# Patient Record
Sex: Female | Born: 1945 | Race: White | Hispanic: Yes | Smoking: Former smoker
Health system: Southern US, Community
[De-identification: ages and names within clinical notes are randomized; demographics above are authoritative.]

## PROBLEM LIST (undated history)

## (undated) DIAGNOSIS — G43909 Migraine, unspecified, not intractable, without status migrainosus: Secondary | ICD-10-CM

## (undated) DIAGNOSIS — J309 Allergic rhinitis, unspecified: Secondary | ICD-10-CM

## (undated) DIAGNOSIS — G5600 Carpal tunnel syndrome, unspecified upper limb: Secondary | ICD-10-CM

## (undated) DIAGNOSIS — K2270 Barrett's esophagus without dysplasia: Secondary | ICD-10-CM

## (undated) DIAGNOSIS — R112 Nausea with vomiting, unspecified: Secondary | ICD-10-CM

## (undated) DIAGNOSIS — I471 Supraventricular tachycardia, unspecified: Secondary | ICD-10-CM

## (undated) DIAGNOSIS — R7303 Prediabetes: Secondary | ICD-10-CM

## (undated) DIAGNOSIS — Z8489 Family history of other specified conditions: Secondary | ICD-10-CM

## (undated) DIAGNOSIS — R7301 Impaired fasting glucose: Secondary | ICD-10-CM

## (undated) DIAGNOSIS — Z9889 Other specified postprocedural states: Secondary | ICD-10-CM

## (undated) DIAGNOSIS — M1990 Unspecified osteoarthritis, unspecified site: Secondary | ICD-10-CM

## (undated) DIAGNOSIS — E785 Hyperlipidemia, unspecified: Secondary | ICD-10-CM

## (undated) DIAGNOSIS — Q613 Polycystic kidney, unspecified: Secondary | ICD-10-CM

## (undated) DIAGNOSIS — C50919 Malignant neoplasm of unspecified site of unspecified female breast: Secondary | ICD-10-CM

## (undated) DIAGNOSIS — K224 Dyskinesia of esophagus: Secondary | ICD-10-CM

## (undated) DIAGNOSIS — K219 Gastro-esophageal reflux disease without esophagitis: Secondary | ICD-10-CM

## (undated) DIAGNOSIS — R160 Hepatomegaly, not elsewhere classified: Secondary | ICD-10-CM

## (undated) HISTORY — DX: Allergic rhinitis, unspecified: J30.9

## (undated) HISTORY — DX: Supraventricular tachycardia, unspecified: I47.10

## (undated) HISTORY — DX: Polycystic kidney, unspecified: Q61.3

## (undated) HISTORY — PX: COLONOSCOPY: SHX174

## (undated) HISTORY — DX: Impaired fasting glucose: R73.01

## (undated) HISTORY — PX: APPENDECTOMY: SHX54

## (undated) HISTORY — PX: EYE SURGERY: SHX253

## (undated) HISTORY — DX: Barrett's esophagus without dysplasia: K22.70

## (undated) HISTORY — DX: Malignant neoplasm of unspecified site of unspecified female breast: C50.919

## (undated) HISTORY — PX: UPPER GI ENDOSCOPY: SHX6162

## (undated) HISTORY — DX: Gastro-esophageal reflux disease without esophagitis: K21.9

## (undated) HISTORY — PX: WRIST SURGERY: SHX841

## (undated) HISTORY — DX: Migraine, unspecified, not intractable, without status migrainosus: G43.909

## (undated) HISTORY — PX: ABDOMINAL HYSTERECTOMY: SHX81

## (undated) HISTORY — DX: Unspecified osteoarthritis, unspecified site: M19.90

## (undated) HISTORY — PX: CATARACT EXTRACTION, BILATERAL: SHX1313

## (undated) HISTORY — DX: Supraventricular tachycardia: I47.1

## (undated) HISTORY — DX: Dyskinesia of esophagus: K22.4

## (undated) HISTORY — DX: Hyperlipidemia, unspecified: E78.5

## (undated) HISTORY — PX: TONSILLECTOMY: SUR1361

## (undated) HISTORY — PX: MASTECTOMY: SHX3

---

## 1981-03-06 HISTORY — PX: PARTIAL HYSTERECTOMY: SHX80

## 1991-03-07 HISTORY — PX: OTHER SURGICAL HISTORY: SHX169

## 2002-03-06 HISTORY — PX: OTHER SURGICAL HISTORY: SHX169

## 2003-03-07 HISTORY — PX: OTHER SURGICAL HISTORY: SHX169

## 2004-05-04 HISTORY — PX: OTHER SURGICAL HISTORY: SHX169

## 2005-04-06 ENCOUNTER — Encounter: Payer: Self-pay | Admitting: Family Medicine

## 2005-08-04 HISTORY — PX: OTHER SURGICAL HISTORY: SHX169

## 2005-10-03 ENCOUNTER — Ambulatory Visit: Payer: Self-pay | Admitting: Family Medicine

## 2005-12-27 ENCOUNTER — Ambulatory Visit: Payer: Self-pay | Admitting: Family Medicine

## 2006-01-12 ENCOUNTER — Ambulatory Visit: Payer: Self-pay | Admitting: Family Medicine

## 2006-02-14 ENCOUNTER — Encounter: Admission: RE | Admit: 2006-02-14 | Discharge: 2006-02-14 | Payer: Self-pay | Admitting: Nephrology

## 2006-02-22 ENCOUNTER — Encounter: Admission: RE | Admit: 2006-02-22 | Discharge: 2006-02-22 | Payer: Self-pay | Admitting: Family Medicine

## 2006-04-04 ENCOUNTER — Ambulatory Visit: Payer: Self-pay | Admitting: Family Medicine

## 2006-06-05 ENCOUNTER — Ambulatory Visit: Payer: Self-pay | Admitting: Family Medicine

## 2006-07-09 ENCOUNTER — Encounter: Payer: Self-pay | Admitting: Family Medicine

## 2006-09-04 ENCOUNTER — Ambulatory Visit: Payer: Self-pay | Admitting: Family Medicine

## 2006-09-06 LAB — CONVERTED CEMR LAB
ALT: 28 units/L (ref 0–35)
Albumin: 4 g/dL (ref 3.5–5.2)
Alkaline Phosphatase: 53 units/L (ref 39–117)
Bilirubin, Direct: 0.1 mg/dL (ref 0.0–0.3)
CO2: 31 meq/L (ref 19–32)
Calcium: 9.2 mg/dL (ref 8.4–10.5)
Creatinine, Ser: 1 mg/dL (ref 0.4–1.2)
GFR calc Af Amer: 72 mL/min
GFR calc non Af Amer: 60 mL/min
Glucose, Bld: 93 mg/dL (ref 70–99)
LDL Cholesterol: 102 mg/dL — ABNORMAL HIGH (ref 0–99)
Potassium: 4.2 meq/L (ref 3.5–5.1)
Sodium: 147 meq/L — ABNORMAL HIGH (ref 135–145)

## 2006-10-03 ENCOUNTER — Encounter: Admission: RE | Admit: 2006-10-03 | Discharge: 2006-10-03 | Payer: Self-pay | Admitting: Nephrology

## 2007-01-04 ENCOUNTER — Ambulatory Visit: Payer: Self-pay | Admitting: Family Medicine

## 2007-02-01 ENCOUNTER — Ambulatory Visit: Payer: Self-pay | Admitting: Family Medicine

## 2007-02-01 LAB — CONVERTED CEMR LAB
Bilirubin Urine: NEGATIVE
Casts: 0 /lpf
Ketones, urine, test strip: NEGATIVE
Nitrite: NEGATIVE
Urobilinogen, UA: 0.2
pH: 6

## 2007-02-25 ENCOUNTER — Encounter: Admission: RE | Admit: 2007-02-25 | Discharge: 2007-02-25 | Payer: Self-pay | Admitting: Family Medicine

## 2007-03-05 ENCOUNTER — Encounter (INDEPENDENT_AMBULATORY_CARE_PROVIDER_SITE_OTHER): Payer: Self-pay | Admitting: *Deleted

## 2007-04-10 ENCOUNTER — Encounter: Payer: Self-pay | Admitting: Family Medicine

## 2007-04-10 DIAGNOSIS — J309 Allergic rhinitis, unspecified: Secondary | ICD-10-CM | POA: Insufficient documentation

## 2007-04-10 DIAGNOSIS — J4520 Mild intermittent asthma, uncomplicated: Secondary | ICD-10-CM | POA: Insufficient documentation

## 2007-04-10 DIAGNOSIS — E785 Hyperlipidemia, unspecified: Secondary | ICD-10-CM | POA: Insufficient documentation

## 2007-04-10 DIAGNOSIS — Z8679 Personal history of other diseases of the circulatory system: Secondary | ICD-10-CM | POA: Insufficient documentation

## 2007-04-10 DIAGNOSIS — Z853 Personal history of malignant neoplasm of breast: Secondary | ICD-10-CM | POA: Insufficient documentation

## 2007-04-10 DIAGNOSIS — K219 Gastro-esophageal reflux disease without esophagitis: Secondary | ICD-10-CM | POA: Insufficient documentation

## 2007-04-10 DIAGNOSIS — J45909 Unspecified asthma, uncomplicated: Secondary | ICD-10-CM

## 2007-04-10 DIAGNOSIS — M1990 Unspecified osteoarthritis, unspecified site: Secondary | ICD-10-CM | POA: Insufficient documentation

## 2007-04-10 DIAGNOSIS — G43909 Migraine, unspecified, not intractable, without status migrainosus: Secondary | ICD-10-CM | POA: Insufficient documentation

## 2007-04-10 DIAGNOSIS — Q613 Polycystic kidney, unspecified: Secondary | ICD-10-CM | POA: Insufficient documentation

## 2007-04-11 ENCOUNTER — Ambulatory Visit: Payer: Self-pay | Admitting: Family Medicine

## 2007-04-11 LAB — CONVERTED CEMR LAB
Nitrite: NEGATIVE
Protein, U semiquant: NEGATIVE
Urobilinogen, UA: NEGATIVE

## 2007-04-18 ENCOUNTER — Encounter: Payer: Self-pay | Admitting: Family Medicine

## 2007-05-02 ENCOUNTER — Telehealth: Payer: Self-pay | Admitting: Family Medicine

## 2007-05-07 ENCOUNTER — Ambulatory Visit (HOSPITAL_COMMUNITY): Admission: RE | Admit: 2007-05-07 | Discharge: 2007-05-07 | Payer: Self-pay | Admitting: Family Medicine

## 2007-05-13 ENCOUNTER — Ambulatory Visit: Payer: Self-pay | Admitting: Family Medicine

## 2007-05-20 ENCOUNTER — Telehealth: Payer: Self-pay | Admitting: Family Medicine

## 2007-05-20 LAB — CONVERTED CEMR LAB
Albumin: 4.3 g/dL (ref 3.5–5.2)
Basophils Absolute: 0 10*3/uL (ref 0.0–0.1)
Basophils Relative: 0.8 % (ref 0.0–1.0)
Eosinophils Absolute: 0.1 10*3/uL (ref 0.0–0.6)
Eosinophils Relative: 1.8 % (ref 0.0–5.0)
Lipase: 22 units/L (ref 11.0–59.0)
Lymphocytes Relative: 36.3 % (ref 12.0–46.0)
MCV: 90.6 fL (ref 78.0–100.0)
Monocytes Relative: 8.4 % (ref 3.0–11.0)
Neutro Abs: 2.8 10*3/uL (ref 1.4–7.7)
Neutrophils Relative %: 52.7 % (ref 43.0–77.0)
RDW: 12 % (ref 11.5–14.6)
Total Protein: 7.2 g/dL (ref 6.0–8.3)

## 2007-05-21 ENCOUNTER — Ambulatory Visit: Payer: Self-pay | Admitting: Internal Medicine

## 2007-05-21 LAB — CONVERTED CEMR LAB
CO2: 31 meq/L (ref 19–32)
Chloride: 106 meq/L (ref 96–112)
Creatinine, Ser: 1 mg/dL (ref 0.4–1.2)
GFR calc Af Amer: 72 mL/min
GFR calc non Af Amer: 60 mL/min

## 2007-05-23 ENCOUNTER — Encounter: Payer: Self-pay | Admitting: Family Medicine

## 2007-05-24 ENCOUNTER — Encounter: Payer: Self-pay | Admitting: Family Medicine

## 2007-05-24 ENCOUNTER — Ambulatory Visit: Payer: Self-pay | Admitting: Internal Medicine

## 2007-05-29 ENCOUNTER — Telehealth: Payer: Self-pay | Admitting: Family Medicine

## 2007-05-30 ENCOUNTER — Ambulatory Visit: Payer: Self-pay | Admitting: Family Medicine

## 2007-05-30 DIAGNOSIS — R7301 Impaired fasting glucose: Secondary | ICD-10-CM | POA: Insufficient documentation

## 2007-07-08 ENCOUNTER — Telehealth: Payer: Self-pay | Admitting: Family Medicine

## 2007-07-23 ENCOUNTER — Encounter: Admission: RE | Admit: 2007-07-23 | Discharge: 2007-07-23 | Payer: Self-pay | Admitting: Family Medicine

## 2007-07-23 ENCOUNTER — Encounter: Payer: Self-pay | Admitting: Family Medicine

## 2007-08-13 ENCOUNTER — Telehealth: Payer: Self-pay | Admitting: Family Medicine

## 2007-08-22 ENCOUNTER — Telehealth: Payer: Self-pay | Admitting: Internal Medicine

## 2007-08-22 ENCOUNTER — Encounter: Admission: RE | Admit: 2007-08-22 | Discharge: 2007-08-22 | Payer: Self-pay | Admitting: Nephrology

## 2007-08-22 ENCOUNTER — Encounter: Payer: Self-pay | Admitting: Family Medicine

## 2007-08-22 ENCOUNTER — Encounter: Payer: Self-pay | Admitting: Internal Medicine

## 2007-08-25 ENCOUNTER — Encounter: Payer: Self-pay | Admitting: Family Medicine

## 2007-08-25 ENCOUNTER — Encounter: Admission: RE | Admit: 2007-08-25 | Discharge: 2007-08-25 | Payer: Self-pay | Admitting: Nephrology

## 2007-09-02 ENCOUNTER — Ambulatory Visit: Payer: Self-pay | Admitting: Family Medicine

## 2007-09-02 LAB — CONVERTED CEMR LAB: KOH Prep: NEGATIVE

## 2007-09-03 ENCOUNTER — Telehealth: Payer: Self-pay | Admitting: Family Medicine

## 2007-09-12 ENCOUNTER — Encounter: Admission: RE | Admit: 2007-09-12 | Discharge: 2007-09-12 | Payer: Self-pay | Admitting: Family Medicine

## 2007-09-25 ENCOUNTER — Ambulatory Visit: Payer: Self-pay | Admitting: Family Medicine

## 2007-10-03 ENCOUNTER — Ambulatory Visit: Payer: Self-pay | Admitting: Family Medicine

## 2007-10-03 LAB — CONVERTED CEMR LAB
Alkaline Phosphatase: 41 units/L (ref 39–117)
BUN: 19 mg/dL (ref 6–23)
Calcium: 9.5 mg/dL (ref 8.4–10.5)
Chloride: 106 meq/L (ref 96–112)
Creatinine, Ser: 1 mg/dL (ref 0.4–1.2)
GFR calc non Af Amer: 60 mL/min
Glucose, Bld: 95 mg/dL (ref 70–99)
HDL: 53.5 mg/dL (ref >39.0)
LDL Cholesterol: 102 mg/dL — ABNORMAL HIGH (ref 0–99)
Potassium: 4.3 meq/L (ref 3.5–5.1)
Sodium: 141 meq/L (ref 135–145)
Total Bilirubin: 0.5 mg/dL (ref 0.3–1.2)
Triglycerides: 76 mg/dL (ref 0–149)

## 2007-10-21 ENCOUNTER — Ambulatory Visit: Payer: Self-pay | Admitting: Family Medicine

## 2007-11-07 ENCOUNTER — Encounter: Payer: Self-pay | Admitting: Family Medicine

## 2007-11-07 ENCOUNTER — Encounter: Payer: Self-pay | Admitting: Internal Medicine

## 2007-12-02 ENCOUNTER — Ambulatory Visit: Payer: Self-pay | Admitting: Family Medicine

## 2008-03-03 ENCOUNTER — Encounter: Admission: RE | Admit: 2008-03-03 | Discharge: 2008-03-03 | Payer: Self-pay | Admitting: Family Medicine

## 2008-03-04 ENCOUNTER — Encounter: Payer: Self-pay | Admitting: Family Medicine

## 2008-04-22 ENCOUNTER — Ambulatory Visit: Payer: Self-pay | Admitting: Family Medicine

## 2008-04-24 ENCOUNTER — Ambulatory Visit: Payer: Self-pay | Admitting: Family Medicine

## 2008-04-27 ENCOUNTER — Encounter: Admission: RE | Admit: 2008-04-27 | Discharge: 2008-04-27 | Payer: Self-pay | Admitting: Nephrology

## 2008-04-30 LAB — CONVERTED CEMR LAB
ALT: 21 units/L (ref 0–35)
CO2: 31 meq/L (ref 19–32)
Chloride: 107 meq/L (ref 96–112)
Cholesterol: 189 mg/dL (ref 0–200)
GFR calc Af Amer: 82 mL/min
Glucose, Bld: 97 mg/dL (ref 70–99)
HDL: 65.6 mg/dL (ref >39.0)
Sodium: 144 meq/L (ref 135–145)
Total CHOL/HDL Ratio: 2.9
Triglycerides: 72 mg/dL (ref 0–149)

## 2008-05-07 ENCOUNTER — Encounter: Payer: Self-pay | Admitting: Internal Medicine

## 2008-05-07 ENCOUNTER — Encounter: Payer: Self-pay | Admitting: Family Medicine

## 2008-05-08 ENCOUNTER — Encounter: Admission: RE | Admit: 2008-05-08 | Discharge: 2008-05-08 | Payer: Self-pay | Admitting: Nephrology

## 2008-06-08 ENCOUNTER — Ambulatory Visit (HOSPITAL_COMMUNITY): Admission: RE | Admit: 2008-06-08 | Discharge: 2008-06-08 | Payer: Self-pay | Admitting: Nephrology

## 2008-06-08 ENCOUNTER — Encounter (INDEPENDENT_AMBULATORY_CARE_PROVIDER_SITE_OTHER): Payer: Self-pay | Admitting: Interventional Radiology

## 2008-06-24 ENCOUNTER — Ambulatory Visit: Payer: Self-pay | Admitting: Family Medicine

## 2008-06-24 LAB — CONVERTED CEMR LAB
Bilirubin Urine: NEGATIVE
Blood in Urine, dipstick: NEGATIVE
Glucose, Urine, Semiquant: NEGATIVE
Nitrite: NEGATIVE
Protein, U semiquant: NEGATIVE
Specific Gravity, Urine: <1.005
Urobilinogen, UA: 0.2
pH: 6

## 2008-06-25 ENCOUNTER — Encounter: Payer: Self-pay | Admitting: Family Medicine

## 2008-07-09 ENCOUNTER — Encounter: Payer: Self-pay | Admitting: Family Medicine

## 2008-07-28 ENCOUNTER — Telehealth: Payer: Self-pay | Admitting: Family Medicine

## 2008-08-06 ENCOUNTER — Encounter: Payer: Self-pay | Admitting: Family Medicine

## 2008-10-22 ENCOUNTER — Encounter: Payer: Self-pay | Admitting: Family Medicine

## 2008-10-26 ENCOUNTER — Encounter: Payer: Self-pay | Admitting: Family Medicine

## 2008-11-17 ENCOUNTER — Encounter: Payer: Self-pay | Admitting: Cardiology

## 2008-12-02 ENCOUNTER — Ambulatory Visit: Payer: Self-pay | Admitting: Family Medicine

## 2008-12-04 ENCOUNTER — Ambulatory Visit: Payer: Self-pay | Admitting: Cardiology

## 2008-12-07 ENCOUNTER — Telehealth (INDEPENDENT_AMBULATORY_CARE_PROVIDER_SITE_OTHER): Payer: Self-pay | Admitting: *Deleted

## 2008-12-08 ENCOUNTER — Ambulatory Visit: Payer: Self-pay | Admitting: Cardiology

## 2008-12-08 ENCOUNTER — Encounter (HOSPITAL_COMMUNITY): Admission: RE | Admit: 2008-12-08 | Discharge: 2009-02-05 | Payer: Self-pay | Admitting: Cardiology

## 2008-12-08 ENCOUNTER — Ambulatory Visit: Payer: Self-pay

## 2008-12-08 LAB — CONVERTED CEMR LAB
ALT: 18 units/L (ref 0–35)
AST: 21 units/L (ref 0–37)
Albumin: 4.2 g/dL (ref 3.5–5.2)
Cholesterol: 175 mg/dL (ref 0–200)
Total Protein: 7 g/dL (ref 6.0–8.3)

## 2008-12-17 ENCOUNTER — Encounter: Payer: Self-pay | Admitting: Family Medicine

## 2009-01-22 ENCOUNTER — Encounter: Payer: Self-pay | Admitting: Family Medicine

## 2009-01-25 ENCOUNTER — Encounter: Payer: Self-pay | Admitting: Family Medicine

## 2009-01-27 ENCOUNTER — Encounter: Admission: RE | Admit: 2009-01-27 | Discharge: 2009-01-27 | Payer: Self-pay | Admitting: Nephrology

## 2009-02-15 ENCOUNTER — Encounter (INDEPENDENT_AMBULATORY_CARE_PROVIDER_SITE_OTHER): Payer: Self-pay | Admitting: *Deleted

## 2009-03-15 ENCOUNTER — Encounter: Admission: RE | Admit: 2009-03-15 | Discharge: 2009-03-15 | Payer: Self-pay | Admitting: Family Medicine

## 2009-03-23 ENCOUNTER — Telehealth: Payer: Self-pay | Admitting: Family Medicine

## 2009-03-26 ENCOUNTER — Ambulatory Visit: Payer: Self-pay | Admitting: Family Medicine

## 2009-03-26 DIAGNOSIS — G5600 Carpal tunnel syndrome, unspecified upper limb: Secondary | ICD-10-CM | POA: Insufficient documentation

## 2009-03-26 LAB — CONVERTED CEMR LAB
Bilirubin Urine: NEGATIVE
Glucose, Urine, Semiquant: NEGATIVE
Ketones, urine, test strip: NEGATIVE
RBC / HPF: 0
Specific Gravity, Urine: 1.015

## 2009-04-29 ENCOUNTER — Ambulatory Visit: Payer: Self-pay | Admitting: Family Medicine

## 2009-04-29 DIAGNOSIS — D731 Hypersplenism: Secondary | ICD-10-CM | POA: Insufficient documentation

## 2009-05-04 LAB — CONVERTED CEMR LAB
ALT: 25 units/L (ref 0–35)
Albumin: 4.5 g/dL (ref 3.5–5.2)
BUN: 20 mg/dL (ref 6–23)
Basophils Relative: 0.5 % (ref 0.0–3.0)
Cholesterol: 189 mg/dL (ref 0–200)
Creatinine, Ser: 0.9 mg/dL (ref 0.4–1.2)
Eosinophils Absolute: 0.1 10*3/uL (ref 0.0–0.7)
Eosinophils Relative: 2.1 % (ref 0.0–5.0)
GFR calc non Af Amer: 67.01 mL/min (ref >60)
Glucose, Bld: 80 mg/dL (ref 70–99)
HCT: 39.6 % (ref 36.0–46.0)
LDL Cholesterol: 103 mg/dL — ABNORMAL HIGH (ref 0–99)
Lymphs Abs: 2.2 10*3/uL (ref 0.7–4.0)
MCHC: 33.1 g/dL (ref 30.0–36.0)
MCV: 91.8 fL (ref 78.0–100.0)
Monocytes Absolute: 0.5 10*3/uL (ref 0.1–1.0)
Neutrophils Relative %: 43.5 % (ref 43.0–77.0)
RBC: 4.31 M/uL (ref 3.87–5.11)
Total Bilirubin: 0.1 mg/dL — ABNORMAL LOW (ref 0.3–1.2)
Triglycerides: 98 mg/dL (ref 0.0–149.0)
VLDL: 19.6 mg/dL (ref 0.0–40.0)
WBC: 4.9 10*3/uL (ref 4.5–10.5)

## 2009-05-05 ENCOUNTER — Ambulatory Visit: Payer: Self-pay | Admitting: Family Medicine

## 2009-05-10 ENCOUNTER — Encounter: Admission: RE | Admit: 2009-05-10 | Discharge: 2009-05-10 | Payer: Self-pay | Admitting: Family Medicine

## 2009-05-12 ENCOUNTER — Encounter (INDEPENDENT_AMBULATORY_CARE_PROVIDER_SITE_OTHER): Payer: Self-pay | Admitting: *Deleted

## 2009-05-13 ENCOUNTER — Encounter: Payer: Self-pay | Admitting: Family Medicine

## 2009-06-01 ENCOUNTER — Telehealth: Payer: Self-pay | Admitting: Family Medicine

## 2009-06-01 ENCOUNTER — Ambulatory Visit: Payer: Self-pay | Admitting: Family Medicine

## 2009-06-03 LAB — CONVERTED CEMR LAB: Mumps IgG: 3.31 — ABNORMAL HIGH

## 2009-07-15 ENCOUNTER — Telehealth: Payer: Self-pay | Admitting: Family Medicine

## 2009-07-16 ENCOUNTER — Ambulatory Visit: Payer: Self-pay | Admitting: Family Medicine

## 2009-08-04 ENCOUNTER — Telehealth: Payer: Self-pay | Admitting: Family Medicine

## 2009-08-09 ENCOUNTER — Ambulatory Visit: Payer: Self-pay | Admitting: Family Medicine

## 2009-08-12 LAB — CONVERTED CEMR LAB
ALT: 35 units/L (ref 0–35)
AST: 30 units/L (ref 0–37)
Direct LDL: 155.8 mg/dL
HDL: 52.9 mg/dL (ref >39.00)
Total CHOL/HDL Ratio: 5
VLDL: 59.4 mg/dL — ABNORMAL HIGH (ref 0.0–40.0)

## 2009-08-16 ENCOUNTER — Encounter: Payer: Self-pay | Admitting: Family Medicine

## 2009-08-23 ENCOUNTER — Encounter (INDEPENDENT_AMBULATORY_CARE_PROVIDER_SITE_OTHER): Payer: Self-pay | Admitting: *Deleted

## 2009-09-03 HISTORY — PX: OTHER SURGICAL HISTORY: SHX169

## 2009-10-15 ENCOUNTER — Telehealth: Payer: Self-pay | Admitting: Family Medicine

## 2009-11-04 HISTORY — PX: BREAST RECONSTRUCTION: SHX9

## 2009-11-11 ENCOUNTER — Encounter: Payer: Self-pay | Admitting: Family Medicine

## 2009-11-15 ENCOUNTER — Ambulatory Visit: Payer: Self-pay | Admitting: Family Medicine

## 2009-11-17 LAB — CONVERTED CEMR LAB
Direct LDL: 130.8 mg/dL
HDL: 49.8 mg/dL (ref >39.00)

## 2009-11-25 ENCOUNTER — Encounter (INDEPENDENT_AMBULATORY_CARE_PROVIDER_SITE_OTHER): Payer: Self-pay | Admitting: Plastic Surgery

## 2009-11-25 ENCOUNTER — Inpatient Hospital Stay (HOSPITAL_COMMUNITY): Admission: RE | Admit: 2009-11-25 | Discharge: 2009-11-28 | Payer: Self-pay | Admitting: Plastic Surgery

## 2009-12-03 ENCOUNTER — Encounter: Payer: Self-pay | Admitting: Family Medicine

## 2009-12-03 ENCOUNTER — Telehealth: Payer: Self-pay | Admitting: Family Medicine

## 2009-12-08 ENCOUNTER — Telehealth: Payer: Self-pay | Admitting: Family Medicine

## 2009-12-29 ENCOUNTER — Ambulatory Visit: Payer: Self-pay | Admitting: Family Medicine

## 2010-01-04 ENCOUNTER — Ambulatory Visit: Payer: Self-pay | Admitting: Family Medicine

## 2010-01-04 DIAGNOSIS — R1011 Right upper quadrant pain: Secondary | ICD-10-CM | POA: Insufficient documentation

## 2010-01-20 ENCOUNTER — Telehealth: Payer: Self-pay | Admitting: Family Medicine

## 2010-01-21 ENCOUNTER — Encounter: Payer: Self-pay | Admitting: Family Medicine

## 2010-02-24 ENCOUNTER — Encounter: Payer: Self-pay | Admitting: Family Medicine

## 2010-02-24 ENCOUNTER — Encounter (INDEPENDENT_AMBULATORY_CARE_PROVIDER_SITE_OTHER): Payer: Self-pay | Admitting: *Deleted

## 2010-02-24 LAB — CONVERTED CEMR LAB
Alkaline Phosphatase: 51 units/L
BUN: 19 mg/dL
Calcium: 9.4 mg/dL
Creatinine, Ser: 0.9 mg/dL
HCT: 37.4 %
Hemoglobin: 12.8 g/dL
Iron: 101 ug/dL
Sodium: 139 meq/L
Total Bilirubin: 0.2 mg/dL

## 2010-03-15 ENCOUNTER — Encounter: Payer: Self-pay | Admitting: Family Medicine

## 2010-03-25 ENCOUNTER — Encounter
Admission: RE | Admit: 2010-03-25 | Discharge: 2010-03-25 | Payer: Self-pay | Source: Home / Self Care | Attending: Family Medicine | Admitting: Family Medicine

## 2010-03-27 ENCOUNTER — Encounter: Payer: Self-pay | Admitting: Nephrology

## 2010-03-28 ENCOUNTER — Encounter (INDEPENDENT_AMBULATORY_CARE_PROVIDER_SITE_OTHER): Payer: Self-pay | Admitting: *Deleted

## 2010-04-05 NOTE — Letter (Signed)
Summary: [facility]  [facility]   Imported By: Lanelle Bal 08/27/2009 10:51:07  _____________________________________________________________________  External Attachment:    Type:   Image     Comment:   External Document

## 2010-04-05 NOTE — Assessment & Plan Note (Signed)
Summary: ROA FOR 6 MONTH FOLLOW-UP/JRR   Vital Signs:  Patient profile:   65 year old female Height:      64 inches Weight:      157.0 pounds BMI:     27.05 Temp:     97.8 degrees F oral Pulse rate:   72 / minute Pulse rhythm:   regular BP sitting:   100 / 70  (left arm) Cuff size:   regular  Vitals Entered By: Benny Lennert CMA Duncan Dull) (January 04, 2010 8:50 AM)  History of Present Illness: Chief complaint 6 month follow up   Hyperlipidemia.Marland Kitchencontrolled trigs back on tricor. Last check in 11/2009.  R carpal tunnel:Had nerve conduction showing severe B .  Had successful  surgery on right wrist in 09/2009.   Has had breast reconstruction 9/22 2011. No complications in hospital. Had last drain removed last week..Dr. Odis Luster.  No current pain, she is very happy with results. Wound looks great today...has follow up in 1 week.  Wearing arm in sling. using methacarbomol as needed..not on any pain medicaiton.  Has had 12 lb weight gain .Marland Kitchen amitryptiline can cause this...but this has been having dramatic improvement in abdominal pain and esophageal symptoms. She has not been exercsiing.  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) 5)  Chest Pain-unspecified  (ICD-786.50) 6)  Routine Gynecological Examination  (ICD-V72.31) 7)  Well Woman  (ICD-V70.0) 8)  Candidiasis, Vulvovaginal  (ICD-112.1) 9)  Chest Wall Pain, Acute  (ICD-786.52) 10)  Impaired Fasting Glucose  (ICD-790.21) 11)  Special Screening For Osteoporosis  (ICD-V82.81) 12)  Abdominal Pain, Right Upper Quadrant  (ICD-789.01) 13)  Dysuria  (ICD-788.1) 14)  Supraventricular Tachycardia, Paroxysmal, Hx of  (ICD-V12.59) 15)  Polycystic Kidney Disease  (ICD-753.12) 16)  Migraine Headache  (ICD-346.90) 17)  Osteoarthritis  (ICD-715.90) 18)  Hyperlipidemia   (ICD-272.4) 19)  Gerd  (ICD-530.81) 20)  Breast Cancer, Hx of  (ICD-V10.3) 21)  Asthma  (ICD-493.90) 22)  Allergic Rhinitis  (ICD-477.9) 23)  Uti  (ICD-599.0)  Current Medications (verified): 1)  Glucosamine Hcl 1000 Mg  Tabs (Glucosamine Hcl) .... Takes 2000 Mg. By Mouth Once Daily 2)  Flonase 50 Mcg/act Susp (Fluticasone Propionate) .... Spray 1 Spray Into Both Nostrils Two Times A Day 3)  Nexium 40 Mg Cpdr (Esomeprazole Magnesium) .... Take 1 Capsule By Mouth Two Times A Day 4)  Allegra 60 Mg  Tabs (Fexofenadine Hcl) .... Take 1 Tablet By Mouth Two Times A Day 5)  Fish Oil Concentrate 1000 Mg  Caps (Omega-3 Fatty Acids) .... Take 1 Capsule By Mouth Once A Day 6)  Right Breast Prosthesis .... Use As Directed 7)  Calcium-Vitamin D 500-200 Mg-Unit Tabs (Calcium Carbonate-Vitamin D) .Marland Kitchen.. 1 Tab By Mouth Daily 8)  Amitriptyline Hcl 10 Mg Tabs (Amitriptyline Hcl) .... 2 Tab By  Mouth At Bedtime 9)  Hyoscyamine Sulfate Cr 0.375 Mg Xr12h-Tab (Hyoscyamine Sulfate) .Marland Kitchen.. 1 Tab By Mouth Twice A Day 10)  Hydrocodone-Acetaminophen 5-500 Mg Tabs (Hydrocodone-Acetaminophen) .Marland Kitchen.. 1 Tab By Mouth Q 6 Hour As Needed Severe Pain 11)  Tricor 145 Mg Tabs (Fenofibrate) .... Take One By Mouth Daily  Allergies: 1)  ! * Anesthesia (?)  Past History:  Past medical, surgical, family and social histories (including risk factors) reviewed, and no changes noted (except as noted below).  Past Medical History: Reviewed  history from 12/04/2008 and no changes required. 1. SUPRAVENTRICULAR TACHYCARDIA, PAROXYSMAL, HX OF (ICD-V12.59): 5 beat run noted on holter monitor in 2005.  2. POLYCYSTIC KIDNEY DISEASE (ICD-753.12) 3. MIGRAINE HEADACHE (ICD-346.90) 4. OSTEOARTHRITIS (ICD-715.90) 5. HYPERLIPIDEMIA (ICD-272.4): Elevated triglycerides.  6. GERD (ICD-530.81): 24 hour pH study did not show significant acid reflux.  7. BREAST CANCER, HX OF (ICD-V10.3): status post mastectomy in 1994.  8. ASTHMA (ICD-493.90) 9.  ALLERGIC RHINITIS (ICD-477.9) 10. Impaired fasting glucose.  11. Esophageal dysmotility: Severe.  She has been evaluated by rheumatology at [facility] but no connective tissue disease diagnosis was made.  She was ANA positive but anti-SCL 70 negative.  12. 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  13. Cardiac workup 2005: Negative cardiolite, Echo with EF 65%, Holter with 5 beat run SVT.  All of this was done in [place].      Past Surgical History: 1993        Radical mastectomy, right side 2004        Rotator cuff, right 03/2003     Holter Monitor - PVC & PAC's & SVT 05/2004     Kidney surgery for cyst removal 1983        Partial hysterectomy due to fibroids 2005        EGD/colonoscopy 08/2005     Abd. CT - bilateral cysts, largest on the right 9.6 x 6.7 cm. 07/2005     PFT's (-) 03/2003     Echo EF 65% 07/2003     Cardiolyte (-) 2002        Dexa T 1.6 nml. 09/2009  right carpal tunnel 11/2009   breast reconstruction  Family History: Reviewed history from 12/04/2008 and no changes required. Father: Alive 4 HTN, arthritis Mother: Alive 23 hypoglycemia, valve dz, polycystic kidney diz., CVA, broken hip, dementia Siblings: 3 brothers, one healthy, one with endocarditis who died at age 68 with heart attack and one with HTN, ulcers DM:  Maternal uncle, aunt Prostate CA:  Father  Social History: Reviewed history from 12/04/2008 and no changes required. Former Smoker Alcohol use-yes, moderate Drug use-no Regular exercise-yes, treadmill, 2 miles 3 x/week, walks 4 miles daily Marital Status: Married Children: 2 adopted children (son and daughter) Originally from [place], moved to [place] from [place] Occupation: [employer]. - retired Diet:  Avoids MSG (triggers HA), cultured cheese, coloring  Review of Systems General:  Denies fatigue and fever; sweating alot at night....since prior to . CV:  Denies chest pain or discomfort and swelling of feet. Resp:  Denies shortness of breath, sputum  productive, and wheezing. GI:  Denies abdominal pain. GU:  Denies dysuria.  Physical Exam  General:  Well-developed,well-nourished,in no acute distress; alert,appropriate and cooperative throughout examination Mouth:  Oral mucosa and oropharynx without lesions or exudates.  Teeth in good repair. Neck:  no carotid bruit or thyromegaly no cervical or supraclavicular lymphadenopathy  Lungs:  Normal respiratory effort, chest expands symmetrically. Lungs are clear to auscultation, no crackles or wheezes. Heart:  Normal rate and regular rhythm. S1 and S2 normal without gallop, murmur, click, rub or other extra sounds. Abdomen:  Bowel sounds positive,abdomen soft and non-tender without masses, organomegaly or hernias noted. Pulses:  R and L posterior tibial pulses are full and equal bilaterally  Extremities:  no edema  Skin:  well healing right flank and breast scars Psych:  Cognition and judgment appear intact. Alert and cooperative with normal attention span and concentration. No apparent delusions, illusions, hallucinations   Impression & Recommendations:  Problem #  1:  CARPAL TUNNEL SYNDROME, BILATERAL (ICD-354.0) IMproved with right surgery and left cortisone injection  Problem # 2:  HYPERLIPIDEMIA (ICD-272.4) Improved controll back on tricor, but LDL not as good as in past. Encouraged exercise, weight loss, healthy eating habits.  Her updated medication list for this problem includes:    Tricor 145 Mg Tabs (Fenofibrate) .Marland Kitchen... Take one by mouth daily  Problem # 3:  BREAST CANCER, HX OF (ICD-V10.3) Successful breast reconstruction. No complications and no current sign of infeciton.   Problem # 4:  Hx of ABDOMINAL PAIN, RIGHT UPPER QUADRANT (ICD-789.01) Significant improvement on amitryptiline.Marland Kitchenalthough SE of weight gain.  encouraged pt to continue and get back on track with exercsie.  Per [facility].   Complete Medication List: 1)  Glucosamine Hcl 1000 Mg Tabs (Glucosamine hcl) ....  Takes 2000 mg. by mouth once daily 2)  Flonase 50 Mcg/act Susp (Fluticasone propionate) .... Spray 1 spray into both nostrils two times a day 3)  Nexium 40 Mg Cpdr (Esomeprazole magnesium) .... Take 1 capsule by mouth two times a day 4)  Allegra 60 Mg Tabs (Fexofenadine hcl) .... Take 1 tablet by mouth two times a day 5)  Fish Oil Concentrate 1000 Mg Caps (Omega-3 fatty acids) .... Take 1 capsule by mouth once a day 6)  Right Breast Prosthesis  .... Use as directed 7)  Calcium-vitamin D 500-200 Mg-unit Tabs (Calcium carbonate-vitamin d) .Marland Kitchen.. 1 tab by mouth daily 8)  Amitriptyline Hcl 10 Mg Tabs (Amitriptyline hcl) .... 2 tab by  mouth at bedtime 9)  Hyoscyamine Sulfate Cr 0.375 Mg Xr12h-tab (Hyoscyamine sulfate) .Marland Kitchen.. 1 tab by mouth twice a day 10)  Hydrocodone-acetaminophen 5-500 Mg Tabs (Hydrocodone-acetaminophen) .Marland Kitchen.. 1 tab by mouth q 6 hour as needed severe pain 11)  Tricor 145 Mg Tabs (Fenofibrate) .... Take one by mouth daily  Patient Instructions: 1)  Work on getting back on track with exercsie and healthy eating. 2)   Prior to CPX... Fasting lipids, CMET Dx 272.0 3)  Please schedule a follow-up appointment in 6 months  for CPX.    Orders Added: 1)  Est. Patient Level IV [04540]    Current Allergies (reviewed today): ! * ANESTHESIA (?)  Last Flu Vaccine:  Fluvax 3+ (12/29/2009 1:59:28 PM) Flu Vaccine Next Due:  1 yr

## 2010-04-05 NOTE — Assessment & Plan Note (Signed)
Summary: bedsole/flu shot/dloe   Nurse Visit   Allergies: 1)  ! * Anesthesia (?)  Orders Added: 1)  Admin 1st Vaccine [90471] 2)  Flu Vaccine 60yrs + [30865] Flu Vaccine Consent Questions     Do you have a history of severe allergic reactions to this vaccine? no    Any prior history of allergic reactions to egg and/or gelatin? no    Do you have a sensitivity to the preservative Thimersol? no    Do you have a past history of Guillan-Barre Syndrome? no    Do you currently have an acute febrile illness? no    Have you ever had a severe reaction to latex? no    Vaccine information given and explained to patient? yes    Are you currently pregnant? no    Lot Number:[id number]   Exp Date:09/03/2010   Site Given  Left Deltoid IM1

## 2010-04-05 NOTE — Progress Notes (Signed)
Summary: regarding mumps  Phone Note Call from Patient Call back at Home Phone [phone] Call back at [phone]   Caller: Patient Call For: Kerby Nora MD Summary of Call: Pt states she is going to [place] to take care of her father, leaving in 2 weeks.  She is concerned because her brother has come down with a bad case of the mumps.  He lives in [place] with pt's father.  She doesnt know if she has ever been vaccinated and she is asking what she should do.  Please advise. Initial call taken by: Lowella Petties CMA,  June 01, 2009 8:30 AM  Follow-up for Phone Call        I believe we can get a titer to see if she is vaccinated with one day turn around. If negative, we could vaccinate her, but she would likely not mount a response by then..to be protective, but would be better than nothing. Let me know if she wants to get titer.  Follow-up by: Kerby Nora MD,  June 01, 2009 9:06 AM  Additional Follow-up for Phone Call Additional follow up Details #1::        titer she would like has appt to day at 2:40a Additional Follow-up by: Benny Lennert CMA Duncan Dull),  June 01, 2009 2:11 PM

## 2010-04-05 NOTE — Progress Notes (Signed)
Summary: having breast reconstruction  Phone Note Call from Patient Call back at Home Phone [phone]   Caller: Patient Call For: Kerby Nora MD Summary of Call: Patient is going for breast reconstruction at [facility] nest month. She just wanted to let you know.  Initial call taken by: Melody Comas,  October 15, 2009 9:46 AM

## 2010-04-05 NOTE — Assessment & Plan Note (Signed)
Summary: tingling in hands/hmw   Vital Signs:  Patient profile:   65 year old female Height:      64 inches Weight:      146.4 pounds BMI:     25.22 Temp:     98.2 degrees F oral Pulse rate:   72 / minute Pulse rhythm:   regular BP sitting:   90 / 60  (left arm) Cuff size:   regular  Vitals Entered By: Benny Lennert CMA Duncan Dull) (March 26, 2009 9:57 AM)  History of Present Illness: Chief complaint tingling in hands  Has had tingling in right hand in past years...has worn carpal tunnel brace that has helped. Now has tingling in hands B hands in last month. Most in 1st three digits B hands...in median nerve distribution. Notes it a lot when driving. Occ wakes her up at night  with pain in hands. Dectreased grip strength.  Uses computer a lot.  No neck pain. Motrin helped some.  Some B low back pain x 1 week...some urinary frequency , no dysuria, no blood in urine  Sees GI for atypical CP.Marland Kitchenthugt to be GERD... taking amitryptiline...recent increase.  Going out of town for the next month..to [place].  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) 3)  Chest Pain-unspecified  (ICD-786.50) 4)  Routine Gynecological Examination  (ICD-V72.31) 5)  Well Woman  (ICD-V70.0) 6)  Candidiasis, Vulvovaginal  (ICD-112.1) 7)  Chest Wall Pain, Acute  (ICD-786.52) 8)  Impaired Fasting Glucose  (ICD-790.21) 9)  Special Screening For Osteoporosis  (ICD-V82.81) 10)  Abdominal Pain, Right Upper Quadrant  (ICD-789.01) 11)  Dysuria  (ICD-788.1) 12)  Supraventricular Tachycardia, Paroxysmal, Hx of  (ICD-V12.59) 13)  Polycystic Kidney Disease  (ICD-753.12) 14)  Migraine Headache  (ICD-346.90) 15)  Osteoarthritis  (ICD-715.90) 16)  Hyperlipidemia  (ICD-272.4) 17)  Gerd  (ICD-530.81) 18)  Breast Cancer, Hx of  (ICD-V10.3) 19)  Asthma  (ICD-493.90) 20)  Allergic Rhinitis  (ICD-477.9) 21)  Uti  (ICD-599.0)  Current  Medications (verified): 1)  Glucosamine Hcl 1000 Mg  Tabs (Glucosamine Hcl) .... Takes 2000 Mg. By Mouth Once Daily 2)  Flonase 50 Mcg/act Susp (Fluticasone Propionate) .... Spray 1 Spray Into Both Nostrils Two Times A Day 3)  Nexium 40 Mg Cpdr (Esomeprazole Magnesium) .... Take 1 Capsule By Mouth Two Times A Day 4)  Allegra 60 Mg  Tabs (Fexofenadine Hcl) .... Take 1 Tablet By Mouth Two Times A Day 5)  Fish Oil Concentrate 1000 Mg  Caps (Omega-3 Fatty Acids) .... Take 1 Capsule By Mouth Once A Day 6)  Right Breast Prosthesis .... Use As Directed 7)  Calcium-Vitamin D 500-200 Mg-Unit Tabs (Calcium Carbonate-Vitamin D) .Marland Kitchen.. 1 Tab By Mouth Daily 8)  Tricor 145 Mg Tabs (Fenofibrate) .Marland Kitchen.. 1 Tab in The Evening 9)  Amitriptyline Hcl 10 Mg Tabs (Amitriptyline Hcl) .... 2 Tab By  Mouth At Bedtime 10)  Hyoscyamine Sulfate Cr 0.375 Mg Xr12h-Tab (Hyoscyamine Sulfate) .Marland Kitchen.. 1 Tab By Mouth Twice A Day 11)  Hydrocodone-Acetaminophen 5-500 Mg Tabs (Hydrocodone-Acetaminophen) .Marland Kitchen.. 1 Tab By Mouth Q 6 Hour As Needed Severe Pain  Allergies: 1)  ! * Anesthesia (?)  Past History:  Past medical, surgical, family and social histories (including risk factors) reviewed, and no changes noted (except as noted below).  Past Medical History: Reviewed history from 12/04/2008 and no changes required. 1. SUPRAVENTRICULAR TACHYCARDIA, PAROXYSMAL, HX OF (ICD-V12.59): 5 beat run noted on holter monitor  in 2005.  2. POLYCYSTIC KIDNEY DISEASE (ICD-753.12) 3. MIGRAINE HEADACHE (ICD-346.90) 4. OSTEOARTHRITIS (ICD-715.90) 5. HYPERLIPIDEMIA (ICD-272.4): Elevated triglycerides.  6. GERD (ICD-530.81): 24 hour pH study did not show significant acid reflux.  7. BREAST CANCER, HX OF (ICD-V10.3): status post mastectomy in 1994.  8. ASTHMA (ICD-493.90) 9. ALLERGIC RHINITIS (ICD-477.9) 10. Impaired fasting glucose.  11. Esophageal dysmotility: Severe.  She has been evaluated by [facility]ology at [facility] but no connective tissue disease  diagnosis was made.  She was ANA positive but anti-SCL 70 negative.  12. 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  13. Cardiac workup 2005: Negative cardiolite, Echo with EF 65%, Holter with 5 beat run SVT.  All of this was done in [place].      Past Surgical History: Reviewed history from 04/10/2007 and no changes required. 1993        Radical mastectomy, right side 2004        Rotator cuff, right 03/2003     Holter Monitor - PVC & PAC's & SVT 05/2004     Kidney surgery for cyst removal 1983        Partial hysterectomy due to fibroids 2005        EGD/colonoscopy 08/2005     Abd. CT - bilateral cysts, largest on the right 9.6 x 6.7 cm. 07/2005     PFT's (-) 03/2003     Echo EF 65% 07/2003     Cardiolyte (-) 2002        Dexa T 1.6 nml.  Family History: Reviewed history from 12/04/2008 and no changes required. Father: Alive 60 HTN, arthritis Mother: Alive 65 hypoglycemia, valve dz, polycystic kidney diz., CVA, broken hip, dementia Siblings: 3 brothers, one healthy, one with endocarditis who died at age 34 with heart attack and one with HTN, ulcers DM:  Maternal uncle, aunt Prostate CA:  Father  Social History: Reviewed history from 12/04/2008 and no changes required. 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 CV:  Denies chest pain or discomfort. Resp:  Denies shortness of breath. GI:  Denies bloody stools, constipation, and diarrhea. GU:  Denies dysuria. Psych:  Denies anxiety and depression.  Physical Exam  General:  Well-developed,well-nourished,in no acute distress; alert,appropriate and cooperative throughout  examination Mouth:  mmm Neck:  no carotid bruit or thyromegaly no cervical or supraclavicular lymphadenopathy full ROm neck, neg Spurling B Lungs:  Normal respiratory effort, chest expands symmetrically. Lungs are clear to auscultation, no crackles or wheezes. Heart:  Normal rate and regular rhythm. S1 and S2 normal without gallop, murmur, click, rub or other extra sounds. Msk:  B shoulders NonTTP, full ROm motion at shoulders and elbows B mildly positive tinel/phalen..but difficult for pateint to tell given tingling was ongpoing already at time of appointment Pulses:  R and L posterior tibial pulses are full and equal bilaterally  Extremities:  no edema Skin:  Intact without suspicious lesions or rashes Psych:  Cognition and judgment appear intact. Alert and cooperative with normal attention span and concentration. No apparent delusions, illusions, hallucinations   Impression & Recommendations:  Problem # 1:  CARPAL TUNNEL SYNDROME, BILATERAL (ICD-354.0) Likely cause..next labs eval wil check B12 and TSH. No sign of cervical radiculopathy or  stenosis suggested.  Wear B braces..consider nerve conduction upon return from vacation if still significant issue. Gven vicodin for severe nighttime pain.  Doubt secondary to amitryptilin, but this can have rare parasthesia SE.   Problem # 2:  LOW BACK PAIN, ACUTE (ICD-724.2)  UA showed no clear sign of infection on micro. Will send culture to verify given going out of town.  Likely MSK starin..cannot take NSAIDs. Given info on low back stretches.   Her updated medication list for this problem includes:    Hydrocodone-acetaminophen 5-500 Mg Tabs (Hydrocodone-acetaminophen) .Marland Kitchen... 1 tab by mouth q 6 hour as needed severe pain  Orders: UA Dipstick w/o Micro (manual) (66063) T-Culture, Urine ([id number]) Specimen Handling (99000)  Complete Medication List: 1)  Glucosamine Hcl 1000 Mg Tabs (Glucosamine hcl) .... Takes 2000 mg. by mouth once  daily 2)  Flonase 50 Mcg/act Susp (Fluticasone propionate) .... Spray 1 spray into both nostrils two times a day 3)  Nexium 40 Mg Cpdr (Esomeprazole magnesium) .... Take 1 capsule by mouth two times a day 4)  Allegra 60 Mg Tabs (Fexofenadine hcl) .... Take 1 tablet by mouth two times a day 5)  Fish Oil Concentrate 1000 Mg Caps (Omega-3 fatty acids) .... Take 1 capsule by mouth once a day 6)  Right Breast Prosthesis  .... Use as directed 7)  Calcium-vitamin D 500-200 Mg-unit Tabs (Calcium carbonate-vitamin d) .Marland Kitchen.. 1 tab by mouth daily 8)  Tricor 145 Mg Tabs (Fenofibrate) .Marland Kitchen.. 1 tab in the evening 9)  Amitriptyline Hcl 10 Mg Tabs (Amitriptyline hcl) .... 2 tab by  mouth at bedtime 10)  Hyoscyamine Sulfate Cr 0.375 Mg Xr12h-tab (Hyoscyamine sulfate) .Marland Kitchen.. 1 tab by mouth twice a day 11)  Hydrocodone-acetaminophen 5-500 Mg Tabs (Hydrocodone-acetaminophen) .Marland Kitchen.. 1 tab by mouth q 6 hour as needed severe pain  Patient Instructions: 1)  Schedule CPX in February...fasting labs prior CMET, lipids Dx 272.0 and  2)  TSH, cbc,  b12, Dx 289.4 3)  heat stretching for low back. 4)  Waer braces B wrists.Marland Kitchenespecially at night if tolerated. 5)  Use vicodin for severe pain.  Prescriptions: HYDROCODONE-ACETAMINOPHEN 5-500 MG TABS (HYDROCODONE-ACETAMINOPHEN) 1 tab by mouth q 6 hour as needed severe pain  #20 x 0   Entered and Authorized by:   Kerby Nora MD   Signed by:   Kerby Nora MD on 03/26/2009   Method used:   Print then Give to Patient   RxID:   [id number]   Current Allergies (reviewed today): ! * ANESTHESIA (?)   Laboratory Results   Urine Tests  Date/Time Received: March 26, 2009 11:07 AM  Date/Time Reported: March 26, 2009 11:07 AM   Routine Urinalysis   Color: yellow Appearance: Clear Glucose: negative   (Normal Range: Negative) Bilirubin: negative   (Normal Range: Negative) Ketone: negative   (Normal Range: Negative) Spec. Gravity: 1.015   (Normal Range: 1.003-1.035) Blood:  trace-lysed   (Normal Range: Negative) pH: 5.0   (Normal Range: 5.0-8.0) Protein: negative   (Normal Range: Negative) Urobilinogen: 0.2   (Normal Range: 0-1) Nitrite: negative   (Normal Range: Negative) Leukocyte Esterace: moderate   (Normal Range: Negative)  Urine Microscopic WBC/HPF: 0-4 RBC/HPF: 0 Epithelial/HPF: neg

## 2010-04-05 NOTE — Letter (Signed)
Summary: CMN for Mastectomy Supplies/[facility] to [facility] for Mastectomy Supplies/[facility] to [facility]   Imported By: Lanelle Bal 12/10/2009 09:03:06  _____________________________________________________________________  External Attachment:    Type:   Image     Comment:   External Document

## 2010-04-05 NOTE — Progress Notes (Signed)
Summary: form for mastectomy supplies  Phone Note From Pharmacy   Caller: Second to [employer] of Call: Form for mastectomy supplies is on your desk. Initial call taken by: Lowella Petties CMA, AAMA,  January 20, 2010 4:55 PM  Follow-up for Phone Call        please fill out as much as possible and i will sign Hannah Beat MD  January 21, 2010 10:30 AM   Just needed signature, form faxed per Lyla Son. Follow-up by: Lowella Petties CMA, AAMA,  January 21, 2010 2:53 PM

## 2010-04-05 NOTE — Progress Notes (Signed)
Summary: form for camisoles  Phone Note From Pharmacy   Caller: Second to [employer] of Call: Order form for [employer] is on [employer]. Initial call taken by: Lowella Petties CMA,  December 03, 2009 8:06 AM

## 2010-04-05 NOTE — Progress Notes (Signed)
Summary: Wants referral to Hand Specialist  Phone Note Call from Patient   Caller: Patient Call For: Kerby Nora MD Summary of Call: Pt called need referral for a hand specilist regarding the carpal tunnel.  Would like to go to the physician located near [facility]??? call back # [phone].Daine Gip  August 04, 2009 10:57 AM  Initial call taken by: Daine Gip,  August 04, 2009 10:58 AM  Follow-up for Phone Call        Referral sent. Follow-up by: Kerby Nora MD,  August 05, 2009 11:12 PM

## 2010-04-05 NOTE — Letter (Signed)
Summary: [facility]   Imported By: Lester Ensley 12/02/2009 11:39:41  _____________________________________________________________________  External Attachment:    Type:   Image     Comment:   External Document

## 2010-04-05 NOTE — Progress Notes (Signed)
Summary: requests zostavax  Phone Note Call from Patient Call back at Home Phone [phone]   Caller: Spouse Call For: Kerby Nora MD Summary of Call: Pt requests zostavax, ok to schedule? Initial call taken by: Lowella Petties CMA,  Jul 15, 2009 9:20 AM  Follow-up for Phone Call        as long as we have it Follow-up by: Hannah Beat MD,  Jul 15, 2009 5:33 PM  Additional Follow-up for Phone Call Additional follow up Details #1::        appt scheduled for today Additional Follow-up by: Benny Lennert CMA Duncan Dull),  Jul 16, 2009 7:56 AM

## 2010-04-05 NOTE — Letter (Signed)
[facility]   Imported By: Lester Kirkwood 11/18/2009 13:31:44  _____________________________________________________________________  External Attachment:    Type:   Image     Comment:   External Document

## 2010-04-05 NOTE — Progress Notes (Signed)
Summary: update on patient  Phone Note Call from Patient   Caller: Patient Call For: Kerby Nora MD Summary of Call: Patient called to let you know that she has now had her breast surgery and she is still recovering. She has rescheduled her 6 month f/u with you for a little further out so that she has time to heal before coming in. She just wanted to update you.  Initial call taken by: Melody Comas,  December 08, 2009 9:46 AM

## 2010-04-05 NOTE — Medication Information (Signed)
Summary: Order for Mastectomy Supplies   Order for Mastectomy Supplies   Imported By: Maryln Gottron 01/28/2010 15:27:15  _____________________________________________________________________  External Attachment:    Type:   Image     Comment:   External Document

## 2010-04-05 NOTE — Miscellaneous (Signed)
Summary: med list update- tricor  Medications Added TRICOR 145 MG TABS (FENOFIBRATE) take one by mouth daily       Clinical Lists Changes  Medications: Added new medication of TRICOR 145 MG TABS (FENOFIBRATE) take one by mouth daily     Prior Medications: GLUCOSAMINE HCL 1000 MG  TABS (GLUCOSAMINE HCL) Takes 2000 mg. by mouth once daily FLONASE 50 MCG/ACT SUSP (FLUTICASONE PROPIONATE) Spray 1 spray into both nostrils two times a day NEXIUM 40 MG CPDR (ESOMEPRAZOLE MAGNESIUM) Take 1 capsule by mouth two times a day ALLEGRA 60 MG  TABS (FEXOFENADINE HCL) Take 1 tablet by mouth two times a day FISH OIL CONCENTRATE 1000 MG  CAPS (OMEGA-3 FATTY ACIDS) Take 1 capsule by mouth once a day RIGHT BREAST PROSTHESIS () use as directed CALCIUM-VITAMIN D 500-200 MG-UNIT TABS (CALCIUM CARBONATE-VITAMIN D) 1 tab by mouth daily AMITRIPTYLINE HCL 10 MG TABS (AMITRIPTYLINE HCL) 2 tab by  mouth at bedtime HYOSCYAMINE SULFATE CR 0.375 MG XR12H-TAB (HYOSCYAMINE SULFATE) 1 tab by mouth twice a day HYDROCODONE-ACETAMINOPHEN 5-500 MG TABS (HYDROCODONE-ACETAMINOPHEN) 1 tab by mouth q 6 hour as needed severe pain TRICOR 145 MG TABS (FENOFIBRATE) take one by mouth daily Current Allergies: ! * ANESTHESIA (?)

## 2010-04-05 NOTE — Assessment & Plan Note (Signed)
Summary: zostavax/hmw   Nurse Visit   Allergies: 1)  ! * Anesthesia (?)  Immunizations Administered:  Zostavax # 1:    Vaccine Type: Zostavax    Site: left deltoid    Mfr: Merck    Dose: 0.70ml    Route: Nixon    Given by: Linde Gillis CMA (AAMA)    Exp. Date: 08/13/2010    Lot #: [id number]    VIS given: 12/16/04 given Jul 16, 2009.  Orders Added: 1)  Zoster (Shingles) Vaccine Live [90736] 2)  Admin 1st Vaccine [phone]

## 2010-04-05 NOTE — Assessment & Plan Note (Signed)
Summary: cpx/dlo   Vital Signs:  Patient profile:   65 year old female Height:      64 inches Weight:      145.8 pounds BMI:     25.12 Temp:     98.1 degrees F oral Pulse rate:   72 / minute Pulse rhythm:   regular BP sitting:   100 / 60  (left arm) Cuff size:   regular  Vitals Entered By: Benny Lennert CMA Duncan Dull) (May 05, 2009 2:09 PM)  History of Present Illness: Chief complaint cpx with out pap  Seen 2 months ago for tingling and pain in hands felt to be carpal tunnel. HAs been wearing B wrist braces with stays for the last few months.  Has vicodin for severe nighttime pain. No s/s last OV of cervical radiculopathy/spinal stenosis.   On vacation..initially had contnued hand symptoms, but then the longer she was there it went away.  Since back from vacation ast work hand symptoms returned.   Continued abdominal pain..has follow up at [facility] in 1 week.  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) 4)  Chest Pain-unspecified  (ICD-786.50) 5)  Routine Gynecological Examination  (ICD-V72.31) 6)  Well Woman  (ICD-V70.0) 7)  Candidiasis, Vulvovaginal  (ICD-112.1) 8)  Chest Wall Pain, Acute  (ICD-786.52) 9)  Impaired Fasting Glucose  (ICD-790.21) 10)  Special Screening For Osteoporosis  (ICD-V82.81) 11)  Abdominal Pain, Right Upper Quadrant  (ICD-789.01) 12)  Dysuria  (ICD-788.1) 13)  Supraventricular Tachycardia, Paroxysmal, Hx of  (ICD-V12.59) 14)  Polycystic Kidney Disease  (ICD-753.12) 15)  Migraine Headache  (ICD-346.90) 16)  Osteoarthritis  (ICD-715.90) 17)  Hyperlipidemia  (ICD-272.4) 18)  Gerd  (ICD-530.81) 19)  Breast Cancer, Hx of  (ICD-V10.3) 20)  Asthma  (ICD-493.90) 21)  Allergic Rhinitis  (ICD-477.9) 22)  Uti  (ICD-599.0)  Current Medications (verified): 1)  Glucosamine Hcl 1000 Mg  Tabs (Glucosamine Hcl) .... Takes 2000 Mg. By Mouth Once Daily 2)   Flonase 50 Mcg/act Susp (Fluticasone Propionate) .... Spray 1 Spray Into Both Nostrils Two Times A Day 3)  Nexium 40 Mg Cpdr (Esomeprazole Magnesium) .... Take 1 Capsule By Mouth Two Times A Day 4)  Allegra 60 Mg  Tabs (Fexofenadine Hcl) .... Take 1 Tablet By Mouth Two Times A Day 5)  Fish Oil Concentrate 1000 Mg  Caps (Omega-3 Fatty Acids) .... Take 1 Capsule By Mouth Once A Day 6)  Right Breast Prosthesis .... Use As Directed 7)  Calcium-Vitamin D 500-200 Mg-Unit Tabs (Calcium Carbonate-Vitamin D) .Marland Kitchen.. 1 Tab By Mouth Daily 8)  Tricor 145 Mg Tabs (Fenofibrate) .Marland Kitchen.. 1 Tab in The Evening 9)  Amitriptyline Hcl 10 Mg Tabs (Amitriptyline Hcl) .... 2 Tab By  Mouth At Bedtime 10)  Hyoscyamine Sulfate Cr 0.375 Mg Xr12h-Tab (Hyoscyamine Sulfate) .Marland Kitchen.. 1 Tab By Mouth Twice A Day 11)  Hydrocodone-Acetaminophen 5-500 Mg Tabs (Hydrocodone-Acetaminophen) .Marland Kitchen.. 1 Tab By Mouth Q 6 Hour As Needed Severe Pain  Allergies: 1)  ! * Anesthesia (?)  Past History:  Past medical, surgical, family and social histories (including risk factors) reviewed, and no changes noted (except as noted below).  Past Medical History: Reviewed history from 12/04/2008 and no changes required. 1. SUPRAVENTRICULAR TACHYCARDIA, PAROXYSMAL, HX OF (ICD-V12.59): 5 beat run noted on holter monitor in 2005.  2. POLYCYSTIC KIDNEY DISEASE (ICD-753.12) 3. MIGRAINE HEADACHE (ICD-346.90) 4. OSTEOARTHRITIS (ICD-715.90) 5. HYPERLIPIDEMIA (ICD-272.4): Elevated  triglycerides.  6. GERD (ICD-530.81): 24 hour pH study did not show significant acid reflux.  7. BREAST CANCER, HX OF (ICD-V10.3): status post mastectomy in 1994.  8. ASTHMA (ICD-493.90) 9. ALLERGIC RHINITIS (ICD-477.9) 10. Impaired fasting glucose.  11. Esophageal dysmotility: Severe.  She has been evaluated by rheumatology at [facility] but no connective tissue disease diagnosis was made.  She was ANA positive but anti-SCL 70 negative.  12. 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  13.  Cardiac workup 2005: Negative cardiolite, Echo with EF 65%, Holter with 5 beat run SVT.  All of this was done in [place].      Past Surgical History: Reviewed history from 04/10/2007 and no changes required. 1993        Radical mastectomy, right side 2004        Rotator cuff, right 03/2003     Holter Monitor - PVC & PAC's & SVT 05/2004     Kidney surgery for cyst removal 1983        Partial hysterectomy due to fibroids 2005        EGD/colonoscopy 08/2005     Abd. CT - bilateral cysts, largest on the right 9.6 x 6.7 cm. 07/2005     PFT's (-) 03/2003     Echo EF 65% 07/2003     Cardiolyte (-) 2002        Dexa T 1.6 nml.  Family History: Reviewed history from 12/04/2008 and no changes required. Father: Alive 36 HTN, arthritis Mother: Alive 48 hypoglycemia, valve dz, polycystic kidney diz., CVA, broken hip, dementia Siblings: 3 brothers, one healthy, one with endocarditis who died at age 88 with heart attack and one with HTN, ulcers DM:  Maternal uncle, aunt Prostate CA:  Father  Social History: Reviewed history from 12/04/2008 and no changes required. Former Smoker Alcohol use-yes, moderate Drug use-no Regular exercise-yes, treadmill, 2 miles 3 x/week, walks 4 miles daily Marital Status: Married Children: 2 adopted children (son and daughter) Originally from [place], moved to [place] from [place] Occupation: [employer]. - retired Diet:  Avoids MSG (triggers HA), cultured cheese, coloring  Physical Exam  General:  Well-developed,well-nourished,in no acute distress; alert,appropriate and cooperative throughout examination Ears:  External ear exam shows no significant lesions or deformities.  Otoscopic examination reveals clear canals, tympanic membranes are intact bilaterally without bulging, retraction, inflammation or discharge. Hearing is grossly normal bilaterally. Nose:  External nasal examination shows no deformity or inflammation. Nasal mucosa are pink and moist without lesions or  exudates. Mouth:  mmm, pharynx pink and moist.   Neck:  no carotid bruit or thyromegaly no cervical or supraclavicular lymphadenopathy full ROm neck, neg Spurling B Chest Wall:  No deformities, masses, or tenderness noted. Breasts:  right mastectomy, no masses, healed scarring, nontender Lungs:  Normal respiratory effort, chest expands symmetrically. Lungs are clear to auscultation, no crackles or wheezes. Heart:  Normal rate and regular rhythm. S1 and S2 normal without gallop, murmur, click, rub or other extra sounds. Abdomen:  chronic right sided abdominal pain Genitalia:  Pelvic Exam:        External: normal female genitalia without lesions or masses        Vagina: normal without lesions or masses        Cervix: none        Adnexa: no amsses, mild right sided pain        Uterus: none        Pap smear: not performed Pulses:  R and L posterior tibial pulses  are full and equal bilaterally  Extremities:  no edema Neurologic:  No cranial nerve deficits noted. Station and gait are normal. Plantar reflexes are down-going bilaterally. DTRs are symmetrical throughout. Sensory, motor and coordinative functions appear intact. Axillary Nodes:  No palpable lymphadenopathy   Impression & Recommendations:  Problem # 1:  WELL WOMAN (ICD-V70.0) Reviewed preventive care protocols, scheduled due services, and updated immunizations.   Problem # 2:  HYPERLIPIDEMIA (ICD-272.4) Well controlled...will stop tricor and recehck in 3 months to assure ocntinued control. Continue fish il.  The following medications were removed from the medication list:    Tricor 145 Mg Tabs (Fenofibrate) .Marland Kitchen... 1 tab in the evening  Problem # 3:  ROUTINE GYNECOLOGICAL EXAMINATION (ICD-V72.31) DVE, no pap  Problem # 4:  CARPAL TUNNEL SYNDROME, BILATERAL (ICD-354.0) Call if interested in nerve conduction or referral to hand specialist if symptoms not well controlled with bracing.   Problem # 5:  IMPAIRED FASTING GLUCOSE  (ICD-790.21) Improved with diet changes.   Complete Medication List: 1)  Glucosamine Hcl 1000 Mg Tabs (Glucosamine hcl) .... Takes 2000 mg. by mouth once daily 2)  Flonase 50 Mcg/act Susp (Fluticasone propionate) .... Spray 1 spray into both nostrils two times a day 3)  Nexium 40 Mg Cpdr (Esomeprazole magnesium) .... Take 1 capsule by mouth two times a day 4)  Allegra 60 Mg Tabs (Fexofenadine hcl) .... Take 1 tablet by mouth two times a day 5)  Fish Oil Concentrate 1000 Mg Caps (Omega-3 fatty acids) .... Take 1 capsule by mouth once a day 6)  Right Breast Prosthesis  .... Use as directed 7)  Calcium-vitamin D 500-200 Mg-unit Tabs (Calcium carbonate-vitamin d) .Marland Kitchen.. 1 tab by mouth daily 8)  Amitriptyline Hcl 10 Mg Tabs (Amitriptyline hcl) .... 2 tab by  mouth at bedtime 9)  Hyoscyamine Sulfate Cr 0.375 Mg Xr12h-tab (Hyoscyamine sulfate) .Marland Kitchen.. 1 tab by mouth twice a day 10)  Hydrocodone-acetaminophen 5-500 Mg Tabs (Hydrocodone-acetaminophen) .Marland Kitchen.. 1 tab by mouth q 6 hour as needed severe pain  Other Orders: Radiology Referral (Radiology)  Patient Instructions: 1)  Check with insurance about shingles vaccine.  2)  Stop tricor... 3)  Recheck fasting lipids, AST, ALT  in 3 months Dx 272.0    4)  Call if ready for referral or nerve conduction for carpal tunnel.  5)  Please schedule a follow-up appointment in 6 months .   Current Allergies (reviewed today): ! * ANESTHESIA (?)  Last Flu Vaccine:  Fluvax 3+ (12/02/2008 9:50:04 AM) Flu Vaccine Next Due:  1 yr Last Hemoccult Result: Negative (03/06/2004 9:53:16 AM) Hemoccult Next Due:  Not Indicated Needs DVE .Marland Kitchenone ovary remains.    Past Medical History:    Reviewed history from 12/04/2008 and no changes required:       1. SUPRAVENTRICULAR TACHYCARDIA, PAROXYSMAL, HX OF (ICD-V12.59): 5 beat run noted on holter monitor in 2005.        2. POLYCYSTIC KIDNEY DISEASE (ICD-753.12)       3. MIGRAINE HEADACHE (ICD-346.90)       4. OSTEOARTHRITIS  (ICD-715.90)       5. HYPERLIPIDEMIA (ICD-272.4): Elevated triglycerides.        6. GERD (ICD-530.81): 24 hour pH study did not show significant acid reflux.        7. BREAST CANCER, HX OF (ICD-V10.3): status post mastectomy in 1994.        8. ASTHMA (ICD-493.90)       9. ALLERGIC RHINITIS (ICD-477.9)  10. Impaired fasting glucose.        11. Esophageal dysmotility: Severe.  She has been evaluated by rheumatology at [facility] but no connective tissue disease diagnosis was made.  She was ANA positive but anti-SCL 70 negative.        12. 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        13. Cardiac workup 2005: Negative cardiolite, Echo with EF 65%, Holter with 5 beat run SVT.  All of this was done in [place].            Past Surgical History:    Reviewed history from 04/10/2007 and no changes required:       1993        Radical mastectomy, right side       2004        Rotator cuff, right       03/2003     Holter Monitor - PVC & PAC's & SVT       05/2004     Kidney surgery for cyst removal       1983        Partial hysterectomy due to fibroids       2005        EGD/colonoscopy       08/2005     Abd. CT - bilateral cysts, largest on the right 9.6 x 6.7 cm.       07/2005     PFT's (-)       03/2003     Echo EF 65%       07/2003     Cardiolyte (-)       2002        Dexa T 1.6 nml.

## 2010-04-05 NOTE — Letter (Signed)
Summary: Results Follow up Letter  [facility]  [address] [place], [place] [address]   Phone: [phone]  Fax: [phone]    05/12/2009 MRN: [id number]     Sydney Ortiz [address] [place], [place]  [address]    Dear Ms. Mezera,  The following are the results of your recent test(s):  Test         Result    Pap Smear:        Normal _____  Not Normal _____ Comments: ______________________________________________________ Cholesterol: LDL(Bad cholesterol):         Your goal is less than:         HDL (Good cholesterol):       Your goal is more than: Comments:  ______________________________________________________ Mammogram:        Normal _____  Not Normal _____ Comments:  ___________________________________________________________________ Hemoccult:        Normal _____  Not normal _______ Comments:    _____________________________________________________________________ Other Tests:Bone Density: Normal    We routinely do not discuss normal results over the telephone.  If you desire a copy of the results, or you have any questions about this information we can discuss them at your next office visit.   Sincerely,

## 2010-04-05 NOTE — Letter (Signed)
[facility]   Imported By: Lanelle Bal 06/19/2009 08:58:10  _____________________________________________________________________  External Attachment:    Type:   Image     Comment:   External Document

## 2010-04-05 NOTE — Letter (Signed)
[facility]   Imported By: Lanelle Bal 03/12/2009 12:31:52  _____________________________________________________________________  External Attachment:    Type:   Image     Comment:   External Document

## 2010-04-05 NOTE — Progress Notes (Signed)
Summary: pain and tingling in hands   Phone Note Call from Patient Call back at Home Phone [phone]   Caller: Patient Call For: Kerby Nora MD Summary of Call: Patient says that for the last week her hands have been causing her alot of pain through the night. She said they are numb at times along with tingling. It happens mostly to right hand, which she says is the side of her body with most of the problems. She has alot of problems with her kidney and she wonders if her hand pain is related in any kind of way. She wanted to ask if she should set up appointment with you for this problem or be seen by her kidney specialist.  Initial call taken by: Melody Comas,  March 23, 2009 4:37 PM  Follow-up for Phone Call        I doubt this is related to kidney..make appt to be seen here.  Follow-up by: Kerby Nora MD,  March 23, 2009 4:59 PM  Additional Follow-up for Phone Call Additional follow up Details #1::        patient advised by message on machine Additional Follow-up by: Benny Lennert CMA Duncan Dull),  March 23, 2009 5:03 PM

## 2010-04-06 ENCOUNTER — Encounter: Payer: Self-pay | Admitting: Nephrology

## 2010-04-07 ENCOUNTER — Encounter (INDEPENDENT_AMBULATORY_CARE_PROVIDER_SITE_OTHER): Payer: Self-pay | Admitting: *Deleted

## 2010-04-07 NOTE — Letter (Signed)
Summary: Results Follow up Letter  [facility]  [address] [place], [place] [address]   Phone: [phone]  Fax: [phone]    03/28/2010 MRN: [id number]      Sydney Ortiz [address] [place], [place]  [address]    Dear Ms. Pennino,  The following are the results of your recent test(s):  Test         Result    Pap Smear:        Normal _____  Not Normal _____ Comments: ______________________________________________________ Cholesterol: LDL(Bad cholesterol):         Your goal is less than:         HDL (Good cholesterol):       Your goal is more than: Comments:  ______________________________________________________ Mammogram:        Normal __x___  Not Normal _____ Comments:Follow up in 1 year   ___________________________________________________________________ Hemoccult:        Normal _____  Not normal _______ Comments:    _____________________________________________________________________ Other Tests:    We routinely do not discuss normal results over the telephone.  If you desire a copy of the results, or you have any questions about this information we can discuss them at your next office visit.   Sincerely,  Kerby Nora MD

## 2010-04-07 NOTE — Letter (Signed)
Summary: [facility]   Imported By: Maryln Gottron 03/28/2010 14:54:49  _____________________________________________________________________  External Attachment:    Type:   Image     Comment:   External Document

## 2010-04-07 NOTE — Letter (Signed)
[facility]   Imported By: Lanelle Bal 02/15/2010 14:08:55  _____________________________________________________________________  External Attachment:    Type:   Image     Comment:   External Document

## 2010-04-13 NOTE — Miscellaneous (Signed)
   Clinical Lists Changes  Observations: Added new observation of FERRITIN: 87 ng/mL (02/24/2010 9:27) Added new observation of IRON SATUR %: 28 % (02/24/2010 9:27) Added new observation of IRON: 101 mcg/dL (04/54/0981 1:91) Added new observation of HCT: 37.4 % (02/24/2010 9:27) Added new observation of HGB: 12.8 g/dL (47/82/9562 1:30) Added new observation of CALCIUM: 9.4 mg/dL (86/57/8469 6:29) Added new observation of ALK PHOS: 51 units/L (02/24/2010 9:27) Added new observation of BILI TOTAL: 0.2 mg/dL (52/84/1324 4:01) Added new observation of SGPT (ALT): 29 units/L (02/24/2010 9:27) Added new observation of SGOT (AST): 25 units/L (02/24/2010 9:27) Added new observation of CO2 PLSM/SER: 28 meq/L (02/24/2010 9:27) Added new observation of CL SERUM: 103 meq/L (02/24/2010 9:27) Added new observation of K SERUM: 3.9 meq/L (02/24/2010 9:27) Added new observation of NA: 139 meq/L (02/24/2010 9:27) Added new observation of CREATININE: 0.90 mg/dL (02/72/5366 4:40) Added new observation of BUN: 19 mg/dL (34/74/2595 6:38) Added new observation of BG RANDOM: 106 mg/dL (75/64/3329 5:18)

## 2010-04-18 ENCOUNTER — Other Ambulatory Visit: Payer: Self-pay | Admitting: Nephrology

## 2010-04-18 DIAGNOSIS — R109 Unspecified abdominal pain: Secondary | ICD-10-CM

## 2010-04-18 DIAGNOSIS — Q613 Polycystic kidney, unspecified: Secondary | ICD-10-CM

## 2010-04-26 ENCOUNTER — Ambulatory Visit
Admission: RE | Admit: 2010-04-26 | Discharge: 2010-04-26 | Disposition: A | Discharge status: Home / Self Care | Payer: BC Managed Care – PPO | Source: Ambulatory Visit | Attending: Nephrology | Admitting: Nephrology

## 2010-04-26 DIAGNOSIS — R109 Unspecified abdominal pain: Secondary | ICD-10-CM

## 2010-04-26 DIAGNOSIS — Q613 Polycystic kidney, unspecified: Secondary | ICD-10-CM

## 2010-05-05 ENCOUNTER — Other Ambulatory Visit: Payer: Self-pay | Admitting: Family Medicine

## 2010-05-05 ENCOUNTER — Encounter (INDEPENDENT_AMBULATORY_CARE_PROVIDER_SITE_OTHER): Payer: Self-pay | Admitting: *Deleted

## 2010-05-05 ENCOUNTER — Other Ambulatory Visit (INDEPENDENT_AMBULATORY_CARE_PROVIDER_SITE_OTHER): Payer: BC Managed Care – PPO

## 2010-05-05 DIAGNOSIS — E785 Hyperlipidemia, unspecified: Secondary | ICD-10-CM

## 2010-05-05 LAB — HEPATIC FUNCTION PANEL
AST: 39 U/L — ABNORMAL HIGH (ref 0–37)
Alkaline Phosphatase: 51 U/L (ref 39–117)
Bilirubin, Direct: 0.1 mg/dL (ref 0.0–0.3)
Total Bilirubin: 0.5 mg/dL (ref 0.3–1.2)

## 2010-05-05 LAB — BASIC METABOLIC PANEL
BUN: 17 mg/dL (ref 6–23)
GFR: 67.66 mL/min (ref >60.00)
Potassium: 4.4 mEq/L (ref 3.5–5.1)
Sodium: 140 mEq/L (ref 135–145)

## 2010-05-05 LAB — LIPID PANEL
LDL Cholesterol: 107 mg/dL — ABNORMAL HIGH (ref 0–99)
Total CHOL/HDL Ratio: 4
VLDL: 25.4 mg/dL (ref 0.0–40.0)

## 2010-05-10 ENCOUNTER — Other Ambulatory Visit: Payer: Self-pay | Admitting: Family Medicine

## 2010-05-10 ENCOUNTER — Encounter: Payer: Self-pay | Admitting: Family Medicine

## 2010-05-10 ENCOUNTER — Encounter (INDEPENDENT_AMBULATORY_CARE_PROVIDER_SITE_OTHER): Payer: BC Managed Care – PPO | Admitting: Family Medicine

## 2010-05-10 DIAGNOSIS — R740 Nonspecific elevation of levels of transaminase and lactic acid dehydrogenase [LDH]: Secondary | ICD-10-CM

## 2010-05-10 DIAGNOSIS — Z0000 Encounter for general adult medical examination without abnormal findings: Secondary | ICD-10-CM

## 2010-05-10 DIAGNOSIS — Z01419 Encounter for gynecological examination (general) (routine) without abnormal findings: Secondary | ICD-10-CM

## 2010-05-10 DIAGNOSIS — R7401 Elevation of levels of liver transaminase levels: Secondary | ICD-10-CM

## 2010-05-10 DIAGNOSIS — K760 Fatty (change of) liver, not elsewhere classified: Secondary | ICD-10-CM | POA: Insufficient documentation

## 2010-05-10 LAB — CONVERTED CEMR LAB

## 2010-05-11 LAB — HEPATIC FUNCTION PANEL
ALT: 50 U/L — ABNORMAL HIGH (ref 0–35)
AST: 34 U/L (ref 0–37)
Total Bilirubin: 0.1 mg/dL — ABNORMAL LOW (ref 0.3–1.2)
Total Protein: 6.9 g/dL (ref 6.0–8.3)

## 2010-05-13 LAB — CONVERTED CEMR LAB
Hep A IgM: NEGATIVE
Hep B C IgM: NEGATIVE

## 2010-05-17 NOTE — Assessment & Plan Note (Signed)
Summary: CPX/DLO   Vital Signs:  Patient profile:   65 year old female Height:      64 inches Weight:      160.75 pounds BMI:     27.69 Temp:     98.4 degrees F oral Pulse rate:   72 / minute Pulse rhythm:   regular BP sitting:   120 / 78  (left arm) Cuff size:   regular  Vitals Entered By: Benny Lennert CMA Duncan Dull) (May 10, 2010 2:04 PM)  History of Present Illness: Chief complaint cpx  The patient is here for annual wellness exam and preventative care.      She has the following acute and chorinic issues:  Polycystic kidney disease.. stable per nephrologist Dr. Tyrone Schimke.  Hyperlipidemia. Well controlled on tricor.. liver function tests are slightly elevated at this check. Last check in 11/2009.  Abdominal pain.Marland Kitchen dramatically improved with amitryptiline.  She is also watching her diet closely.   Has not been exercisng recently due to surgeries.  Had recent cataract surgery. Has April 23 left carpal tunnel surgery pending.  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) 3)  Routine Gynecological Examination  (ICD-V72.31) 4)  Well Woman  (ICD-V70.0) 5)  Impaired Fasting Glucose  (ICD-790.21) 6)  Special Screening For Osteoporosis  (ICD-V82.81) 7)  Hx of Abdominal Pain, Right Upper Quadrant  (ICD-789.01) 8)  Supraventricular Tachycardia, Paroxysmal, Hx of  (ICD-V12.59) 9)  Polycystic Kidney Disease  (ICD-753.12) 10)  Migraine Headache  (ICD-346.90) 11)  Osteoarthritis  (ICD-715.90) 12)  Hyperlipidemia  (ICD-272.4) 13)  Gerd  (ICD-530.81) 14)  Breast Cancer, Hx of  (ICD-V10.3) 15)  Asthma  (ICD-493.90) 16)  Allergic Rhinitis  (ICD-477.9)  Current Medications (verified): 1)  Glucosamine Hcl 1000 Mg  Tabs (Glucosamine Hcl) .... Takes 2000 Mg. By Mouth Once Daily 2)  Flonase 50 Mcg/act Susp (Fluticasone Propionate) .... Spray 1 Spray Into Both Nostrils Two Times A Day 3)  Nexium 40 Mg Cpdr  (Esomeprazole Magnesium) .... Take 1 Capsule By Mouth Two Times A Day 4)  Allegra 60 Mg  Tabs (Fexofenadine Hcl) .... Take 1 Tablet By Mouth Two Times A Day 5)  Fish Oil Concentrate 1000 Mg  Caps (Omega-3 Fatty Acids) .... Take 1 Capsule By Mouth Once A Day 6)  Right Breast Prosthesis .... Use As Directed 7)  Calcium-Vitamin D 500-200 Mg-Unit Tabs (Calcium Carbonate-Vitamin D) .Marland Kitchen.. 1 Tab By Mouth Daily 8)  Amitriptyline Hcl 10 Mg Tabs (Amitriptyline Hcl) .... 2 Tab By  Mouth At Bedtime 9)  Tricor 145 Mg Tabs (Fenofibrate) .... Take One By Mouth Daily  Allergies: 1)  ! * Anesthesia (?)  Past History:  Past medical, surgical, family and social histories (including risk factors) reviewed, and no changes noted (except as noted below).  Past Medical History: Reviewed history from 12/04/2008 and no changes required. 1. SUPRAVENTRICULAR TACHYCARDIA, PAROXYSMAL, HX OF (ICD-V12.59): 5 beat run noted on holter monitor in 2005.  2. POLYCYSTIC KIDNEY DISEASE (ICD-753.12) 3. MIGRAINE HEADACHE (ICD-346.90) 4. OSTEOARTHRITIS (ICD-715.90) 5. HYPERLIPIDEMIA (ICD-272.4): Elevated triglycerides.  6. GERD (ICD-530.81): 24 hour pH study did not show significant acid reflux.  7. BREAST CANCER, HX OF (ICD-V10.3): status post mastectomy in 1994.  8. ASTHMA (ICD-493.90) 9. ALLERGIC RHINITIS (ICD-477.9) 10. Impaired fasting glucose.  11. Esophageal dysmotility: Severe.  She has been evaluated by rheumatology at [facility] but no connective tissue disease diagnosis was made.  She was ANA positive but anti-SCL 70 negative.  12. 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  13. Cardiac workup 2005: Negative cardiolite, Echo with EF 65%, Holter with 5 beat run SVT.  All of this was done in [place].      Past Surgical History: Reviewed history from 01/04/2010 and no changes required. 1993        Radical mastectomy, right side 2004        Rotator cuff, right 03/2003     Holter Monitor - PVC & PAC's & SVT 05/2004      Kidney surgery for cyst removal 1983        Partial hysterectomy due to fibroids 2005        EGD/colonoscopy 08/2005     Abd. CT - bilateral cysts, largest on the right 9.6 x 6.7 cm. 07/2005     PFT's (-) 03/2003     Echo EF 65% 07/2003     Cardiolyte (-) 2002        Dexa T 1.6 nml. 09/2009  right carpal tunnel 11/2009   breast reconstruction  Family History: Reviewed history from 12/04/2008 and no changes required. Father: Alive 47 HTN, arthritis Mother: Alive 44 hypoglycemia, valve dz, polycystic kidney diz., CVA, broken hip, dementia Siblings: 3 brothers, one healthy, one with endocarditis who died at age 62 with heart attack and one with HTN, ulcers DM:  Maternal uncle, aunt Prostate CA:  Father  Social History: Reviewed history from 12/04/2008 and no changes required. 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 CV:  Denies chest pain or discomfort. Resp:  Denies shortness of breath. GI:  Denies abdominal pain and bloody stools. GU:  Denies dysuria.  Physical Exam  General:  Well-developed,well-nourished,in no acute distress; alert,appropriate and cooperative throughout examination Ears:  External ear exam shows no significant lesions or deformities.  Otoscopic examination reveals clear canals, tympanic membranes are intact bilaterally without bulging, retraction, inflammation or discharge. Hearing is grossly normal bilaterally. Nose:  External nasal examination shows no deformity or inflammation. Nasal mucosa are pink and moist without lesions or exudates. Mouth:  Oral mucosa and oropharynx without lesions or exudates.  Teeth in good  repair. Neck:  no carotid bruit or thyromegaly no cervical or supraclavicular lymphadenopathy  Chest Wall:  No deformities, masses, or tenderness noted. Breasts:  right mastectomy now with reconstruction, no masses, healed scarring, nontender Lungs:  Normal respiratory effort, chest expands symmetrically. Lungs are clear to auscultation, no crackles or wheezes. Heart:  Normal rate and regular rhythm. S1 and S2 normal without gallop, murmur, click, rub or other extra sounds. Abdomen:  Bowel sounds positive,abdomen soft and non-tender without masses, organomegaly or hernias noted. Genitalia:  Pelvic Exam:        External: normal female genitalia without lesions or masses        Vagina: normal without lesions or masses        Cervix: none        Adnexa: normal bimanual exam without masses or fullness        Uterus: none        Pap smear: not performed Pulses:  R and L posterior tibial pulses are full and equal bilaterally  Extremities:  no edema Skin:  Intact without  suspicious lesions or rashes Psych:  Cognition and judgment appear intact. Alert and cooperative with normal attention span and concentration. No apparent delusions, illusions, hallucinations   Impression & Recommendations:  Problem # 1:  WELL WOMAN (ICD-V70.0) The patient's preventative maintenance and recommended screening tests for an annual wellness exam were reviewed in full today. Brought up to date unless services declined.  Counselled on the importance of diet, exercise, and its role in overall health and mortality. The patient's FH and SH was reviewed, including their home life, tobacco status, and drug and alcohol status.     Problem # 2:  ROUTINE GYNECOLOGICAL EXAMINATION (ICD-V72.31) DVE no pap.   Problem # 3:  TRANSAMINASES, SERUM, ELEVATED (ICD-790.4) ? due to tricor and TCA. HAs history of transfusion. Will recheck and check hepatitis panel.  Orders: TLB-Hepatic/Liver Function Pnl  (80076-HEPATIC) T-Hepatitis Acute Panel ([id number])  Problem # 4:  IMPAIRED FASTING GLUCOSE (ICD-790.21)  IMproved.   Labs Reviewed: Creat: 0.9 (05/05/2010)     Problem # 5:  HYPERLIPIDEMIA (ICD-272.4) Improved back on tricor. Her updated medication list for this problem includes:    Tricor 145 Mg Tabs (Fenofibrate) .Marland Kitchen... Take one by mouth daily  Complete Medication List: 1)  Glucosamine Hcl 1000 Mg Tabs (Glucosamine hcl) .... Takes 2000 mg. by mouth once daily 2)  Flonase 50 Mcg/act Susp (Fluticasone propionate) .... Spray 1 spray into both nostrils two times a day 3)  Nexium 40 Mg Cpdr (Esomeprazole magnesium) .... Take 1 capsule by mouth two times a day 4)  Allegra 60 Mg Tabs (Fexofenadine hcl) .... Take 1 tablet by mouth two times a day 5)  Fish Oil Concentrate 1000 Mg Caps (Omega-3 fatty acids) .... Take 1 capsule by mouth once a day 6)  Right Breast Prosthesis  .... Use as directed 7)  Calcium-vitamin D 500-200 Mg-unit Tabs (Calcium carbonate-vitamin d) .Marland Kitchen.. 1 tab by mouth daily 8)  Amitriptyline Hcl 10 Mg Tabs (Amitriptyline hcl) .... 2 tab by  mouth at bedtime 9)  Tricor 145 Mg Tabs (Fenofibrate) .... Take one by mouth daily  Patient Instructions: 1)  Please schedule a follow-up appointment in 6 months. with fasting labs prior.. CMET, Lipids Dx 272.0   Orders Added: 1)  TLB-Hepatic/Liver Function Pnl [80076-HEPATIC] 2)  T-Hepatitis Acute Panel [80074-22940] 3)  Est. Patient 40-64 years [09811]    Current Allergies (reviewed today): ! * ANESTHESIA (?)  Last PAP:  Done (04/06/2005 9:52:46 AM) PAP Result Date:  05/10/2010 PAP Result:  DVE , no pap  PAP Next Due:  1 yr    Past Medical History:    Reviewed history from 12/04/2008 and no changes required:       1. SUPRAVENTRICULAR TACHYCARDIA, PAROXYSMAL, HX OF (ICD-V12.59): 5 beat run noted on holter monitor in 2005.        2. POLYCYSTIC KIDNEY DISEASE (ICD-753.12)       3. MIGRAINE HEADACHE (ICD-346.90)        4. OSTEOARTHRITIS (ICD-715.90)       5. HYPERLIPIDEMIA (ICD-272.4): Elevated triglycerides.        6. GERD (ICD-530.81): 24 hour pH study did not show significant acid reflux.        7. BREAST CANCER, HX OF (ICD-V10.3): status post mastectomy in 1994.        8. ASTHMA (ICD-493.90)       9. ALLERGIC RHINITIS (ICD-477.9)       10. Impaired fasting glucose.        11. Esophageal dysmotility: Severe.  She  has been evaluated by rheumatology at [facility] but no connective tissue disease diagnosis was made.  She was ANA positive but anti-SCL 70 negative.        12. 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        13. Cardiac workup 2005: Negative cardiolite, Echo with EF 65%, Holter with 5 beat run SVT.  All of this was done in [place].            Past Surgical History:    Reviewed history from 01/04/2010 and no changes required:       1993        Radical mastectomy, right side       2004        Rotator cuff, right       03/2003     Holter Monitor - PVC & PAC's & SVT       05/2004     Kidney surgery for cyst removal       1983        Partial hysterectomy due to fibroids       2005        EGD/colonoscopy       08/2005     Abd. CT - bilateral cysts, largest on the right 9.6 x 6.7 cm.       07/2005     PFT's (-)       03/2003     Echo EF 65%       07/2003     Cardiolyte (-)       2002        Dexa T 1.6 nml.       09/2009  right carpal tunnel       11/2009   breast reconstruction

## 2010-05-19 LAB — CBC
MCH: 30.6 pg (ref 26.0–34.0)
MCHC: 33.2 g/dL (ref 30.0–36.0)
Platelets: 341 10*3/uL (ref 150–400)
RBC: 4.45 MIL/uL (ref 3.87–5.11)

## 2010-05-19 LAB — BASIC METABOLIC PANEL
BUN: 17 mg/dL (ref 6–23)
Calcium: 9.6 mg/dL (ref 8.4–10.5)
GFR calc non Af Amer: >60 mL/min (ref >60)
Glucose, Bld: 97 mg/dL (ref 70–99)
Sodium: 141 mEq/L (ref 135–145)

## 2010-05-19 LAB — SURGICAL PCR SCREEN: Staphylococcus aureus: NEGATIVE

## 2010-06-15 LAB — CBC
Platelets: 261 10*3/uL (ref 150–400)
RDW: 13.3 % (ref 11.5–15.5)

## 2010-07-19 NOTE — Assessment & Plan Note (Signed)
[facility]                         GASTROENTEROLOGY OFFICE NOTE   Sydney Ortiz, Sydney Ortiz                         MRN:          [id number]  DATE:05/21/2007                            DOB:          Apr 16, 1945    REFERRING PHYSICIAN:  Kerby Nora, M.D.   REASON FOR CONSULTATION:  Right upper quadrant epigastric pain.   ASSESSMENT:  Epigastric and right upper quadrant pain in a woman with  polycystic kidney disease.  The etiology of this pain is not entirely  clear to me at this time.  She does have some cystic kidneys, but I do  not think that would be causing the problem.  A recent ultrasound showed  no gallstones.  The pain seems to start in the epigastrium and radiate  around to the right upper quadrant.  She is tender in the epigastrium.  She is on a PPI which helps somewhat.  Reflux disease, peptic ulcer  disease, and gastrointestinal malignancy are possible.  Question if she  could have a cystic lesion of the pancreas which can occur in polycystic  kidney disease.  I would have thought something symptomatic should have  been seen on the ultrasound, though this ultrasound did not indicate  that the pancreas was seen (no mention of the pancreas).   PLAN:  1. Schedule esophagogastroduodenoscopy.  2. Check a BMET today in anticipation of possible CT versus MR.  She      is going to see Terrial Rhodes, M.D. in a couple of days and I      have given her a note to ask him if he thought CT versus MRI would      be appropriate, i.e., which one.  3. Consider antispasmodic.   Of note, her BMET has returned with a glucose of 175, but an otherwise  normal set of numbers including BUN 14 and creatinine 1.   Risks, benefits, and indications of upper GI endoscopy were explained to  the patient and she understands and agrees to proceed.   A screening colonoscopy would be appropriate in 2010 given that she had  a suboptimal prep in the right colon on her  colonoscopy in July of 2005.   HISTORY:  This lady has had intermittent pain starting in the  epigastrium and radiating into the right upper quadrant as noted.  There  is no clear precipitant and she thinks Nexium might help.  Sometimes  water will relieve it.  It has actually been a problem for several years  it seems.  There is something always there, but there are exacerbations  of it.  It can awaken her in the early morning as well.  She has not  lost weight.  She does not describe vomiting, nausea, or dysphagia.  There is no chest pain.  Ultrasound findings as above or read the  report.  She has had an EGD before as well, she tells me in 2005 was  okay and we have records from 1999 as well.  She actually has had  colonoscopy twice prior to the one in 2005 and has known hemorrhoids and  occasionally will see some bright red blood per rectum from them on the  toilet paper.  She had been followed in [place] for a 10-year history  of GERD symptoms.   MEDICATIONS:  1. TriCor.  2. Glucosamine.  3. Nexium.  4. Allegra.  5. Caltrate.  6. Fish Oil.  7. Premarin.   Dosages are in the chart.   ALLERGIES:  No known drug allergies.   PAST MEDICAL HISTORY:  Polycystic kidney disease, migraine headaches,  osteoarthritis, dyslipidemia, gastroesophageal reflux disease, personal  history of breast cancer, asthma, allergic rhinitis, paroxysmal  supraventricular tachycardia, internal hemorrhoids.   PAST SURGICAL HISTORY:  Hysterectomy, right mastectomy, appendectomy.   FAMILY HISTORY:  Father and brother had prostate cancer, mother had  heart disease, uncle had diabetes.  Father and uncle were alcoholics.  Maternal grandmother died of hepatic cancer.   SOCIAL HISTORY:  Married.  She works in the youth professional social  services area.  She has a Bachelor's degree.  She has two adopted  children.  Moderate alcohol use.  No tobacco or drugs.   REVIEW OF SYSTEMS:  See the medical  history form documented in the  chart.  Positive for allergies, urination difficulty, skin rash, joint  pain, insomnia, and back pain.  All other systems negative.   PHYSICAL EXAMINATION:  VITAL SIGNS:  Height 5 feet 4 inches, weight 160  pounds, blood pressure 94/60, pulse 80.  GENERAL:  This is a well-developed, well-nourished, [place]  woman.  HEENT:  Eyes; anicteric.  ENT; normal mouth, nose, and pharynx.  NECK:  Supple with no thyromegaly or mass.  CHEST:  Clear.  HEART:  S1 and S2, with no murmurs, rubs, or gallops.  ABDOMEN:  Tender in the epigastrium to a mild to moderate degree without  organomegaly or mass.  Bowel sounds are present.  There is no  organomegaly or tenderness.  LYMPHATIC:  No neck, supraclavicular, or groin adenopathy.  EXTREMITIES:  No peripheral edema.  SKIN:  Warm and dry.  I see no acute rash.  NEUROLOGY:  She is alert and oriented x3.  Cranial nerves II-XII grossly  intact.  PSYCHIATRIC:  Appropriate affect.   I reviewed the ultrasound report, the previous colonoscopy and GI workup  from [place], I reviewed Dr. Daphine Deutscher notes in the electronic  medical record.   I appreciate the opportunity to care for this patient.     Iva Boop, MD,FACG  Electronically Signed    CEG/MedQ  DD: 05/22/2007  DT: 05/22/2007  Job #: [id number]   cc:   Kerby Nora, MD  Terrial Rhodes, M.D.

## 2010-07-22 NOTE — Assessment & Plan Note (Signed)
[name]                             [place] OFFICE NOTE   NAME:Ortiz Ortiz                         MRN:          [id number]  DATE:10/04/2005                            DOB:          09-10-1945    CHIEF COMPLAINT:  A 65 year old white female, here to establish a new  doctor.   HISTORY OF PRESENT ILLNESS:  Ortiz Ortiz states that she has been in good  health over the past few months.  She states that she has migraine headaches  that are well controlled.  She would like to determine if she is due for her  cholesterol check for her high cholesterol.  She does have history of  polycystic kidney disease which we discussed, and I will determine how we  will follow this.   REVIEW OF SYSTEMS:  Positive headache, occasional dizziness.  Syncope in the  past but not recently.  She wears glasses.  She denies changes in her  hearing or vision.  She denies shortness of breath and chest pain.  She  occasionally has some palpitations with stress and being busy.  She denies  cough, wheeze.  She denies nausea, vomiting, diarrhea, constipation.  She  denies blood in her stools.  She denies reflux, nocturia, urinary frequency.  She denies myalgias and arthralgias.   PAST MEDICAL HISTORY:  1.  Breast cancer in 1993.  2.  Osteoarthritis in hands and shoulders.  3.  Asthma.  4.  Allergic rhinitis.  5.  Migraines.  6.  Hypertriglyceridemia.  7.  Polycystic kidney disease.  8.  Gastroesophageal reflux disease.  9.  Paroxysmal SVT, PVC and PAC.   HOSPITALIZATIONS SURGERIES AND PROCEDURES:  1.  In 1993, radical mastectomy on the right side.  2.  In 1983, partial hysterectomy secondary to fibroids.  3.  In 2002, DEXA with T score of 1.6, normal.  4.  In 2004, rotator cuff surgery on the right.  5.  In January 2005, Holter monitor showing PVC, PAC and SVT paroxysmal.  6.  In January 2005, echocardiogram ejection fraction 55%.  7.  May 2005, Cardiolite  negative.  8.  In 2005, EGD/colonoscopy normal.  9.  June 2007, abdominal CT showing bilateral cyst with the largest renal      cyst with the largest on the right 9.6x6.7.  10. May 2007, pulmonary function test within normal limits.  11. March 2006, kidney surgery for cyst removal.   ALLERGIES:  None.   MEDICATIONS:  1.  Nexium 40 mg daily.  2.  Tri-Chlor 145 mg daily.  3.  Allegra D 60 mg daily.  4.  Flonase q. Sprays per nostril p.r.n.  5.  Advil PM q.h.s.  6.  Multivitamin daily.  7.  Glucosamine 2 tablets daily.  8.  Excedrin migraine p.r.n.   HEALTH MAINTENANCE:  1.  Last mammogram normal in December 2006.  2.  Pap smears q.3 years, last one in February 2007.  3.  Colonoscopy July 2005, normal.  4.  Cholesterol last checked in May 2005, LDL was 113, and HDL 67.  5.  Kidney function last checked May 7, creatinine was 1.1.   FAMILY HISTORY:  Father alive at age 51 with hypertension and arthritis.  Mother alive at age 53 with hypoglycemia, valve disease and polycystic  kidney disease, CVA, broken hips, dementia.  She has three brothers, one  with endocarditis who died at age 59.  The other two have hypertension and  ulcers.   She does have family history of diabetes in some aunts and uncles.  She has  personal history of breast cancer.  Her father had prostate cancer, and  there is no family history of colon cancer, depression, alcohol or drug use,  gout or arthritis.   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  She has been married for 38 years.  She moved to the area  three months ago from [place].  She exercises with walking about four  miles daily and treadmills two miles three times a week.  She has two  adopted children, a son and a daughter.  As far as her diet, she avoids MSG,  cultured cheese and food coloring because it triggers headaches.  Tobacco  abuse with a remote history.  Social alcohol use 3-4 drinks per  week.   PHYSICAL EXAMINATION:  VITAL SIGNS:  Blood pressure 122/80, weight 154,  height 63, pulse 80, temperature 98.  GENERAL:  Healthy appearing, elderly female in no apparent distress.  HEENT:  PERRLA.  Extraocular muscles intact.  Nares clear.  Oropharynx  clear.  Tympanic membranes clear.  No lymphadenopathy.  No thyromegaly.  CARDIOVASCULAR:  Regular rate and rhythm.  No murmurs, rubs or gallops.  PULMONARY:  Clear to auscultation bilaterally.  No wheezes, rales or  rhonchi.  No cyanosis.  EXTREMITIES:  No peripheral edema.  Pulses 2+ in brachial and radial  regions.  MUSCULOSKELETAL:  Strength 5/5 in upper and lower extremities.  Normal  sensation, 2+ reflexes.  NEUROLOGICAL:  Cranial nerves 2-12 grossly intact.   ASSESSMENT/PLAN:  1.  History of breast cancer.  She will obtain mammograms q. one year, and      the next one is in December 2007.  2.  Polycystic kidney disease.  I will do some research on how frequent      creatinine and abdominal CT's need to be obtained.  I will also set her      up with a nephrologist in the area for at least an initial visit.  I am      happy to do long term monitoring with guidelines made by the      nephrologist.  3.  Hypercholesterolemia.  The patient had been having this checked every      three months, but she appears to be at her goal LVL of less than 130 and      her goal triglycerides of less than 150.  We can space this to following      it every six months, and the next one should be obtained around November      2007.  She will follow up as needed.                                   Kerby Nora, MD   AB/MedQ  DD:  10/04/2005  DT:  10/05/2005  Job #:  [id number]

## 2010-08-11 LAB — HEPATIC FUNCTION PANEL
ALT: 33 U/L (ref 7–35)
AST: 23 U/L (ref 13–35)

## 2010-08-11 LAB — BASIC METABOLIC PANEL
Creatinine: 1 mg/dL (ref 0.5–1.1)
Glucose: 94 mg/dL
Potassium: 4.3 mmol/L (ref 3.4–5.3)

## 2010-08-15 ENCOUNTER — Other Ambulatory Visit (INDEPENDENT_AMBULATORY_CARE_PROVIDER_SITE_OTHER): Payer: Medicare Other | Admitting: Family Medicine

## 2010-08-15 DIAGNOSIS — R945 Abnormal results of liver function studies: Secondary | ICD-10-CM

## 2010-08-15 DIAGNOSIS — R7989 Other specified abnormal findings of blood chemistry: Secondary | ICD-10-CM

## 2010-08-15 LAB — HEPATIC FUNCTION PANEL
ALT: 38 U/L — ABNORMAL HIGH (ref 0–35)
AST: 32 U/L (ref 0–37)
Albumin: 4.8 g/dL (ref 3.5–5.2)
Total Bilirubin: 0.4 mg/dL (ref 0.3–1.2)

## 2010-08-16 ENCOUNTER — Other Ambulatory Visit: Payer: Medicare Other

## 2010-08-16 DIAGNOSIS — R7401 Elevation of levels of liver transaminase levels: Secondary | ICD-10-CM

## 2010-08-17 LAB — HEPATITIS PANEL, ACUTE
Hep B C IgM: NEGATIVE
Hepatitis B Surface Ag: NEGATIVE

## 2010-09-14 ENCOUNTER — Telehealth: Payer: Self-pay | Admitting: *Deleted

## 2010-09-14 NOTE — Telephone Encounter (Signed)
agree

## 2010-09-14 NOTE — Telephone Encounter (Signed)
Patient says that she pulled a tick off of her last week. There is a red bump where the tick bite was. She was asking if she should be alarmed. I advised her that the red bump is not uncommon after a tick bite and advised her to call our office if she starts having any fever, body aches, or flu like symptoms to call office for appt.

## 2010-09-20 ENCOUNTER — Encounter: Payer: Self-pay | Admitting: Cardiology

## 2010-09-22 ENCOUNTER — Encounter: Payer: Self-pay | Admitting: Family Medicine

## 2010-11-15 ENCOUNTER — Other Ambulatory Visit (INDEPENDENT_AMBULATORY_CARE_PROVIDER_SITE_OTHER): Payer: Medicare Other

## 2010-11-15 DIAGNOSIS — E7800 Pure hypercholesterolemia, unspecified: Secondary | ICD-10-CM

## 2010-11-16 LAB — COMPREHENSIVE METABOLIC PANEL
ALT: 27 U/L (ref 0–35)
AST: 31 U/L (ref 0–37)
Albumin: 4.3 g/dL (ref 3.5–5.2)
CO2: 23 mEq/L (ref 19–32)
Calcium: 9.5 mg/dL (ref 8.4–10.5)
Chloride: 112 mEq/L (ref 96–112)
GFR: 62.65 mL/min (ref >60.00)
Potassium: 4.6 mEq/L (ref 3.5–5.1)

## 2010-11-16 LAB — LIPID PANEL: Total CHOL/HDL Ratio: 3

## 2010-11-18 ENCOUNTER — Other Ambulatory Visit: Payer: Self-pay | Admitting: *Deleted

## 2010-11-18 MED ORDER — FENOFIBRATE 145 MG PO TABS: 145.0000 mg | ORAL_TABLET | Freq: Every day | ORAL | Status: DC

## 2010-11-21 ENCOUNTER — Ambulatory Visit (INDEPENDENT_AMBULATORY_CARE_PROVIDER_SITE_OTHER): Payer: Medicare Other | Admitting: Family Medicine

## 2010-11-21 ENCOUNTER — Encounter: Payer: Self-pay | Admitting: Family Medicine

## 2010-11-21 VITALS — BP 112/70 | HR 72 | Temp 97.9°F | Ht 64.0 in | Wt 156.1 lb

## 2010-11-21 DIAGNOSIS — R7401 Elevation of levels of liver transaminase levels: Secondary | ICD-10-CM

## 2010-11-21 DIAGNOSIS — E785 Hyperlipidemia, unspecified: Secondary | ICD-10-CM

## 2010-11-21 DIAGNOSIS — Z23 Encounter for immunization: Secondary | ICD-10-CM

## 2010-11-21 DIAGNOSIS — R7402 Elevation of levels of lactic acid dehydrogenase (LDH): Secondary | ICD-10-CM

## 2010-11-21 DIAGNOSIS — R7301 Impaired fasting glucose: Secondary | ICD-10-CM

## 2010-11-21 DIAGNOSIS — Q613 Polycystic kidney, unspecified: Secondary | ICD-10-CM

## 2010-11-21 DIAGNOSIS — E871 Hypo-osmolality and hyponatremia: Secondary | ICD-10-CM

## 2010-11-21 NOTE — Assessment & Plan Note (Signed)
Well controlled. Continue current medication.  

## 2010-11-21 NOTE — Progress Notes (Signed)
  Subjective:    Patient ID: Sydney Ortiz, female    DOB: [birth year], 65 y.o.   MRN: [id number]  HPI  Elevated Cholesterol: LDL at goal <130, triglycerides at goal on fenofibrate and fish oil Using medications without problems:Yes Muscle aches: None Other complaints: Liver functions in nml.  Abdominal pain/spasms... Stable control...full work up... Seen at [facility]. Has stopped amitryptiline given weight gain SE. She is using SL hyoscamine prn. Using Beano before eating.   Elevated :LFTs due to fatty liver... Improved with diet changes and working on weight loss Review of Systems  Constitutional: Negative for fever and fatigue.  HENT: Negative for ear pain.   Eyes: Negative for pain.  Respiratory: Negative for chest tightness and shortness of breath.   Cardiovascular: Negative for chest pain, palpitations and leg swelling.  Gastrointestinal: Negative for abdominal pain.  Genitourinary: Negative for dysuria.       Objective:   Physical Exam  Constitutional: Vital signs are normal. She appears well-developed and well-nourished. She is cooperative.  Non-toxic appearance. She does not appear ill. No distress.  HENT:  Head: Normocephalic.  Right Ear: Hearing, tympanic membrane, external ear and ear canal normal. Tympanic membrane is not erythematous, not retracted and not bulging.  Left Ear: Hearing, tympanic membrane, external ear and ear canal normal. Tympanic membrane is not erythematous, not retracted and not bulging.  Nose: No mucosal edema or rhinorrhea. Right sinus exhibits no maxillary sinus tenderness and no frontal sinus tenderness. Left sinus exhibits no maxillary sinus tenderness and no frontal sinus tenderness.  Mouth/Throat: Uvula is midline, oropharynx is clear and moist and mucous membranes are normal.  Eyes: Conjunctivae, EOM and lids are normal. Pupils are equal, round, and reactive to light. No foreign bodies found.  Neck: Trachea normal and normal range of motion.  Neck supple. Carotid bruit is not present. No mass and no thyromegaly present.  Cardiovascular: Normal rate, regular rhythm, S1 normal, S2 normal, normal heart sounds, intact distal pulses and normal pulses.  Exam reveals no gallop and no friction rub.   No murmur heard. Pulmonary/Chest: Effort normal and breath sounds normal. Not tachypneic. No respiratory distress. She has no decreased breath sounds. She has no wheezes. She has no rhonchi. She has no rales.  Abdominal: Soft. Normal appearance and bowel sounds are normal. There is no tenderness.  Neurological: She is alert.  Skin: Skin is warm, dry and intact. No rash noted.  Psychiatric: Her speech is normal and behavior is normal. Judgment and thought content normal. Her mood appears not anxious. Cognition and memory are normal. She does not exhibit a depressed mood.          Assessment & Plan:

## 2010-11-21 NOTE — Assessment & Plan Note (Signed)
Not on meds that cause this. Will re-eval with repeat labs.

## 2010-11-21 NOTE — Assessment & Plan Note (Signed)
Resolved with lifestye changes.l

## 2010-11-21 NOTE — Patient Instructions (Signed)
Continue working on exercise and weight loss and healthy diet.

## 2010-11-21 NOTE — Assessment & Plan Note (Signed)
Stable per [facility].. LAst note 09/2010 reviewed in detail.

## 2010-11-22 LAB — BASIC METABOLIC PANEL
CO2: 28 mEq/L (ref 19–32)
Calcium: 10.4 mg/dL (ref 8.4–10.5)
Chloride: 108 mEq/L (ref 96–112)
Potassium: 4.2 mEq/L (ref 3.5–5.1)
Sodium: 145 mEq/L (ref 135–145)

## 2011-01-09 ENCOUNTER — Other Ambulatory Visit: Payer: Self-pay | Admitting: Nephrology

## 2011-01-09 DIAGNOSIS — Q613 Polycystic kidney, unspecified: Secondary | ICD-10-CM

## 2011-02-08 LAB — BASIC METABOLIC PANEL
BUN: 21 mg/dL (ref 4–21)
Creatinine: 0.9 mg/dL (ref 0.5–1.1)

## 2011-02-08 LAB — HEPATIC FUNCTION PANEL: Alkaline Phosphatase: 48 U/L (ref 25–125)

## 2011-02-13 ENCOUNTER — Ambulatory Visit
Admission: RE | Admit: 2011-02-13 | Discharge: 2011-02-13 | Disposition: A | Discharge status: Home / Self Care | Payer: Medicare Other | Source: Ambulatory Visit | Attending: Nephrology | Admitting: Nephrology

## 2011-02-13 DIAGNOSIS — Q613 Polycystic kidney, unspecified: Secondary | ICD-10-CM

## 2011-02-22 ENCOUNTER — Other Ambulatory Visit: Payer: Self-pay | Admitting: Family Medicine

## 2011-02-22 DIAGNOSIS — Z1231 Encounter for screening mammogram for malignant neoplasm of breast: Secondary | ICD-10-CM

## 2011-03-15 ENCOUNTER — Encounter: Payer: Self-pay | Admitting: Family Medicine

## 2011-03-16 ENCOUNTER — Encounter: Payer: Medicare Other | Admitting: Family Medicine

## 2011-03-21 ENCOUNTER — Encounter: Payer: Self-pay | Admitting: Family Medicine

## 2011-03-21 ENCOUNTER — Ambulatory Visit (INDEPENDENT_AMBULATORY_CARE_PROVIDER_SITE_OTHER): Payer: Medicare Other | Admitting: Family Medicine

## 2011-03-21 VITALS — BP 110/78 | HR 65 | Temp 98.6°F | Ht 64.0 in | Wt 154.4 lb

## 2011-03-21 DIAGNOSIS — I471 Supraventricular tachycardia: Secondary | ICD-10-CM

## 2011-03-21 DIAGNOSIS — E785 Hyperlipidemia, unspecified: Secondary | ICD-10-CM

## 2011-03-21 DIAGNOSIS — Q613 Polycystic kidney, unspecified: Secondary | ICD-10-CM

## 2011-03-21 DIAGNOSIS — K760 Fatty (change of) liver, not elsewhere classified: Secondary | ICD-10-CM

## 2011-03-21 DIAGNOSIS — Z0000 Encounter for general adult medical examination without abnormal findings: Secondary | ICD-10-CM

## 2011-03-21 NOTE — Assessment & Plan Note (Signed)
Encouraged exercise, weight loss, healthy eating habits. ? ?

## 2011-03-21 NOTE — Patient Instructions (Addendum)
Continue working on exercise, healthy diet and weight loss. Keep appt for mammogram. Follow up in 1 year.

## 2011-03-21 NOTE — Assessment & Plan Note (Signed)
Stable per Dr. Tyrone Schimke.

## 2011-03-21 NOTE — Assessment & Plan Note (Addendum)
Encouraged exercise, weight loss, healthy eating habits.  Well controlled last check in 10/2011 on tricor.

## 2011-03-21 NOTE — Progress Notes (Signed)
Subjective:    Patient ID: Sydney Ortiz, female    DOB: 16-Oct-1945, 66 y.o.   MRN: [id number]  HPI I have personally reviewed the Medicare Annual Wellness questionnaire and have noted 1. The patient's medical and social history 2. Their use of alcohol, tobacco or illicit drugs 3. Their current medications and supplements 4. The patient's functional ability including ADL's, fall risks, home safety risks and hearing or visual             impairment. 5. Diet and physical activities 6. Evidence for depression or mood disorders  The patients weight, height, BMI and visual acuity have been recorded in the chart I have made referrals, counseling and provided education to the patient based review of the above and I have provided the pt with a written personalized care plan for preventive services.   Going back to [place] for visit in 04/2011.  PKD: followed by nephrology. Cr stable last check.  Paroxsysmal PSVT: Stable, asymptomatic.  Abdominal spasm: Followed by [facility] (Dr. Lucretia Roers). On hyoscamine.  Using beano before meals.  Elevated LFTS/fatty liver: nml last check in 02/2011. Fatty liver seen on [place] 2/12  Prediabetes: resolved last check.   Elevated Cholesterol: LDL at goal <130, trig at [place] at 111on 10/2010 on tricor. Using medications without problems:None Muscle aches: None Diet compliance: Low fat, low carb. Exercise: Daily.. Lifting weights, water aerobics, walking Other complaints: Has lost 6 lbs in last year.    Review of Systems  Constitutional: Negative for fever, fatigue and unexpected weight change.  HENT: Negative for ear pain, congestion, sore throat, sneezing, trouble swallowing and sinus pressure.   Eyes: Negative for pain and itching.  Respiratory: Negative for cough, shortness of breath and wheezing.   Cardiovascular: Negative for chest pain, palpitations and leg swelling.  Gastrointestinal: Negative for nausea, abdominal pain, diarrhea, constipation and blood  in stool.  Genitourinary: Negative for dysuria, hematuria, vaginal discharge, difficulty urinating and menstrual problem.  Skin: Negative for rash.  Neurological: Negative for syncope, weakness, light-headedness, numbness and headaches.  Psychiatric/Behavioral: Negative for confusion and dysphoric mood. The patient is not nervous/anxious.        Objective:   Physical Exam  Constitutional: Vital signs are normal. She appears well-developed and well-nourished. She is cooperative.  Non-toxic appearance. She does not appear ill. No distress.  HENT:  Head: Normocephalic.  Right Ear: Hearing, tympanic membrane, external ear and ear canal normal.  Left Ear: Hearing, tympanic membrane, external ear and ear canal normal.  Nose: Nose normal.  Eyes: Conjunctivae, EOM and lids are normal. Pupils are equal, round, and reactive to light. No foreign bodies found.  Neck: Trachea normal and normal range of motion. Neck supple. Carotid bruit is not present. No mass and no thyromegaly present.  Cardiovascular: Normal rate, regular rhythm, S1 normal, S2 normal, normal heart sounds and intact distal pulses.  Exam reveals no gallop.   No murmur heard. Pulmonary/Chest: Effort normal and breath sounds normal. No respiratory distress. She has no wheezes. She has no rhonchi. She has no rales.  Abdominal: Soft. Normal appearance and bowel sounds are normal. She exhibits no distension, no fluid wave, no abdominal bruit and no mass. There is no hepatosplenomegaly. There is no tenderness. There is no rebound, no guarding and no CVA tenderness. No hernia.  Genitourinary: Vagina normal and uterus normal. No breast swelling, tenderness, discharge or bleeding. Pelvic exam was performed with patient prone. There is no rash, tenderness or lesion on the right labia. There is  no rash, tenderness or lesion on the left labia. Uterus is not enlarged and not tender. Right adnexum displays no mass, no tenderness and no fullness. Left  adnexum displays no mass, no tenderness and no fullness.       No pap performed  Lymphadenopathy:    She has no cervical adenopathy.    She has no axillary adenopathy.  Neurological: She is alert. She has normal strength. No cranial nerve deficit or sensory deficit.  Skin: Skin is warm, dry and intact. No rash noted.  Psychiatric: Her speech is normal and behavior is normal. Judgment normal. Her mood appears not anxious. Cognition and memory are normal. She does not exhibit a depressed mood.          Assessment & Plan:  Welcome to Medicare: The patient's preventative maintenance and recommended screening tests for an annual wellness exam were reviewed in full today. Brought up to date unless services declined.  Counselled on the importance of diet, exercise, and its role in overall health and mortality. The patient's FH and SH was reviewed, including their home life, tobacco status, and drug and alcohol status.    EKG: No changes. NSR, no ST changes, no LVH. Mammo/breast exam: personal history of breast cancer: Scheduled for this month. Vaccines: Uptodate with PNA, Tdap, flu, shingles DVE/pap:partial hysterectomy, one ovary remains. DVE yearly, no pap indicated. Colon: Last exam in 2005, repeat due in 2015. DXA: 05/2009 Normal.. Repeat after 3/2 013, will do next year 2014

## 2011-03-22 ENCOUNTER — Other Ambulatory Visit: Payer: Self-pay | Admitting: *Deleted

## 2011-03-22 MED ORDER — ESOMEPRAZOLE MAGNESIUM 40 MG PO CPDR: 40.0000 mg | DELAYED_RELEASE_CAPSULE | Freq: Two times a day (BID) | ORAL | Status: DC

## 2011-03-27 ENCOUNTER — Ambulatory Visit
Admission: RE | Admit: 2011-03-27 | Discharge: 2011-03-27 | Disposition: A | Discharge status: Home / Self Care | Payer: Medicare Other | Source: Ambulatory Visit | Attending: Family Medicine | Admitting: Family Medicine

## 2011-03-27 DIAGNOSIS — Z1231 Encounter for screening mammogram for malignant neoplasm of breast: Secondary | ICD-10-CM

## 2011-03-30 ENCOUNTER — Other Ambulatory Visit: Payer: Self-pay | Admitting: Family Medicine

## 2011-03-30 DIAGNOSIS — R928 Other abnormal and inconclusive findings on diagnostic imaging of breast: Secondary | ICD-10-CM

## 2011-04-06 ENCOUNTER — Ambulatory Visit
Admission: RE | Admit: 2011-04-06 | Discharge: 2011-04-06 | Disposition: A | Discharge status: Home / Self Care | Payer: Medicare Other | Source: Ambulatory Visit | Attending: Family Medicine | Admitting: Family Medicine

## 2011-04-06 ENCOUNTER — Other Ambulatory Visit: Payer: Self-pay | Admitting: Family Medicine

## 2011-04-06 DIAGNOSIS — R928 Other abnormal and inconclusive findings on diagnostic imaging of breast: Secondary | ICD-10-CM

## 2011-06-06 ENCOUNTER — Telehealth: Payer: Self-pay | Admitting: Family Medicine

## 2011-06-06 NOTE — Telephone Encounter (Signed)
Caller: Robert/Patient; PCP: Kerby Nora E.; CB#: [phone]; Call regarding Chest Pain/Chest Discomfort.  Chest congestion with occasional cough present X 1 wk. Onset 05/30/11.  Afebrile.  Pt not present. Calling for appt. Declined triage. No appt remain for 06/06/11.  Transferred to office/Jaime for appt for 06/07/11 for caller requesting appt, triage offered and declined per PCP Call Guideline.

## 2011-06-06 NOTE — Telephone Encounter (Signed)
Noted  

## 2011-06-07 ENCOUNTER — Ambulatory Visit (INDEPENDENT_AMBULATORY_CARE_PROVIDER_SITE_OTHER): Payer: Medicare Other | Admitting: Family Medicine

## 2011-06-07 ENCOUNTER — Encounter: Payer: Self-pay | Admitting: Family Medicine

## 2011-06-07 VITALS — BP 114/72 | HR 68 | Temp 98.1°F | Wt 156.2 lb

## 2011-06-07 DIAGNOSIS — J309 Allergic rhinitis, unspecified: Secondary | ICD-10-CM

## 2011-06-07 MED ORDER — ALBUTEROL SULFATE HFA 108 (90 BASE) MCG/ACT IN AERS: 2.0000 | INHALATION_SPRAY | Freq: Four times a day (QID) | RESPIRATORY_TRACT | Status: DC | PRN

## 2011-06-07 NOTE — Patient Instructions (Signed)
I think this is more allergy flare. Increase flonase to 2 sprays in each nostril daily.  Increase allegra as well. Start albuterol inhaler as needed. If fever >101 or worsening productive cough or not improving as expected, please let us know.  If worsening sinus headache not improving after 10 days, also let us know.

## 2011-06-07 NOTE — Progress Notes (Signed)
  Subjective:    Patient ID: Sydney Ortiz, female    DOB: [birth year], 66 y.o.   MRN: [id number]  HPI CC: sinus congestion  [place] of [place]  Since return (05/01/2011) has had worsening congestion, coryza, sinus pressure headache.  Last week started feeling more congestion in chest and tightness/SOB.  Denies wheezing.  Occasional R ear discomfort.  + PNDrainage.  Mild dry cough, worse last night.  Blowing nose not really productive of mucous.  Has tried OTC tussin DM which may have helped a bit.  Also takes allegra, flonase, and nasal saline irrigation.  No fever/chills, abd pain, n/v, ST, tooth pain.  H/o seasonal allergies, worse in spring.  + h/o asthma when lived in [place], not recently.  No smokers at home.  No sick contacts at home.  Review of Systems Per HPI    Objective:   Physical Exam  Nursing note and vitals reviewed. Constitutional: She appears well-developed and well-nourished. No distress.       hoarse  HENT:  Head: Normocephalic and atraumatic.  Right Ear: Hearing, tympanic membrane, external ear and ear canal normal.  Left Ear: Hearing, tympanic membrane, external ear and ear canal normal.  Nose: Mucosal edema present. No rhinorrhea. Right sinus exhibits no maxillary sinus tenderness and no frontal sinus tenderness. Left sinus exhibits no maxillary sinus tenderness and no frontal sinus tenderness.  Mouth/Throat: Uvula is midline, oropharynx is clear and moist and mucous membranes are normal. No oropharyngeal exudate, posterior oropharyngeal edema, posterior oropharyngeal erythema or tonsillar abscesses.  Eyes: Conjunctivae and EOM are normal. Pupils are equal, round, and reactive to light. No scleral icterus.  Neck: Normal range of motion. Neck supple.  Cardiovascular: Normal rate, regular rhythm, normal heart sounds and intact distal pulses.   No murmur heard. Pulmonary/Chest: Effort normal and breath sounds normal. No respiratory distress. She has no  wheezes. She has no rales.  Lymphadenopathy:    She has no cervical adenopathy.  Skin: Skin is warm and dry. No rash noted.       Assessment & Plan:

## 2011-06-07 NOTE — Assessment & Plan Note (Signed)
Anticipate allergic rhinitis flare, lungs clear today.   I don't think bacterial sinusitis component but would have low threshold to cover with abx. Treat with increased flonase, allegra, and start albuterol inhaler PRN.  Continue tussin DM for cough. If not improving, consider treatment for sinusitis.

## 2011-07-24 ENCOUNTER — Telehealth: Payer: Self-pay | Admitting: Family Medicine

## 2011-07-24 NOTE — Telephone Encounter (Signed)
Questions regarding changes another doctor made to the Nexium prescription.  Please call patient back.

## 2011-07-25 NOTE — Telephone Encounter (Signed)
Left message for return my call

## 2011-07-27 NOTE — Telephone Encounter (Signed)
Unable to contact patient will discuss when she contacts our office again

## 2011-08-21 ENCOUNTER — Other Ambulatory Visit: Payer: Self-pay | Admitting: Family Medicine

## 2011-08-21 NOTE — Telephone Encounter (Signed)
[facility] request refill flonase #16 g x 1.

## 2011-09-24 ENCOUNTER — Emergency Department: Payer: Self-pay | Admitting: Emergency Medicine

## 2011-09-24 LAB — URINALYSIS, COMPLETE
Bilirubin,UR: NEGATIVE
Glucose,UR: NEGATIVE mg/dL (ref 0–75)
Hyaline Cast: 1
Ketone: NEGATIVE
Protein: NEGATIVE
RBC,UR: 2 /HPF (ref 0–5)
Specific Gravity: 1.012 (ref 1.003–1.030)
Squamous Epithelial: 1
WBC UR: 6 /HPF (ref 0–5)

## 2011-09-24 LAB — BASIC METABOLIC PANEL
Calcium, Total: 9.6 mg/dL (ref 8.5–10.1)
Chloride: 107 mmol/L (ref 98–107)
Co2: 25 mmol/L (ref 21–32)
Creatinine: 0.94 mg/dL (ref 0.60–1.30)
EGFR (African American): >60
Osmolality: 280 (ref 275–301)
Sodium: 139 mmol/L (ref 136–145)

## 2011-09-24 LAB — CBC
HCT: 37.9 % (ref 35.0–47.0)
MCHC: 33.4 g/dL (ref 32.0–36.0)
MCV: 92 fL (ref 80–100)
Platelet: 209 10*3/uL (ref 150–440)
RBC: 4.12 10*6/uL (ref 3.80–5.20)
RDW: 13.9 % (ref 11.5–14.5)
WBC: 4.6 10*3/uL (ref 3.6–11.0)

## 2011-09-25 ENCOUNTER — Telehealth: Payer: Self-pay | Admitting: Family Medicine

## 2011-09-25 LAB — URINE CULTURE

## 2011-09-25 NOTE — Telephone Encounter (Signed)
Caller: Candies/Patient; PCP: Kerby Nora E.; CB#: [phone]; ; ; Call regarding Follow Up; Seen in ED 09/24/11 for UTI;  states  is happy to send records from ED to office, but they told her they need a ROI sent from [facility] to fax [phone].  Placed on Cipro.  States she has history of polycystic disease.  Declines new triage; info to office.

## 2011-09-25 NOTE — Telephone Encounter (Signed)
No need to obtain records for routine UTI unless she is not improving or complications.

## 2011-09-25 NOTE — Telephone Encounter (Signed)
Caller: Shantai/Patient; PCP: Kerby Nora E.; CB#: [phone]. Call regarding ED Follow Up; Caller reports she spoke with RN this am re her ED visit yesterday, Sunday 7/21. [facility] and EP[facility] notes reviewed, Seen for UTI and was given IM and Cipro to take PO. Caller now reports she still has headache that began on Sat 7/20, and was worse after she was seen. Temp was 102 on Sat night and again Sunday night/early am. Afebrile in the ED. 1st dose of Cipro (bid dosing) was at 9:30 today, 7/22 after breakfast and was nauseated all am. She then vomited at appx 3pm. Currently she is afebrile, headache is better, nausea is resolving. Caller was advised this am that it can take up to 48 hrs before she sees considerable improvement with antibxs. Caller states, "I am calling because I do not want to feel the way I did at 2am during the night tonight." Home Care given per Urinary Sxs-Female Protocol with callback parameters. Caller then asked if MD has rec'd the ED records. Info from [facility] chart given. "No need to obtain records for routine UTI unless she is not improving or complications." Note to PCP via EPIC.

## 2011-09-26 NOTE — Telephone Encounter (Signed)
Given she is not feeling well.. Get reocords for my reviuew and call pt to check on her today.

## 2011-09-26 NOTE — Telephone Encounter (Signed)
Records requested

## 2011-10-23 ENCOUNTER — Other Ambulatory Visit: Payer: Self-pay | Admitting: *Deleted

## 2011-10-23 NOTE — Telephone Encounter (Signed)
Received faxed refill request from pharmacy. Last office visit 06/07/11; acute visit. Is it okay to refill medication?

## 2011-10-24 MED ORDER — FLUTICASONE PROPIONATE 50 MCG/ACT NA SUSP: NASAL | Status: DC

## 2011-11-17 ENCOUNTER — Other Ambulatory Visit: Payer: Self-pay | Admitting: *Deleted

## 2011-11-17 MED ORDER — FENOFIBRATE 145 MG PO TABS: 145.0000 mg | ORAL_TABLET | Freq: Every day | ORAL | Status: DC

## 2011-12-13 ENCOUNTER — Ambulatory Visit (INDEPENDENT_AMBULATORY_CARE_PROVIDER_SITE_OTHER): Payer: Medicare Other

## 2011-12-13 DIAGNOSIS — Z23 Encounter for immunization: Secondary | ICD-10-CM

## 2011-12-22 ENCOUNTER — Other Ambulatory Visit: Payer: Self-pay | Admitting: *Deleted

## 2011-12-22 MED ORDER — FLUTICASONE PROPIONATE 50 MCG/ACT NA SUSP: NASAL | Status: DC

## 2012-01-16 ENCOUNTER — Other Ambulatory Visit: Payer: Self-pay | Admitting: Family Medicine

## 2012-01-16 DIAGNOSIS — Z9011 Acquired absence of right breast and nipple: Secondary | ICD-10-CM

## 2012-01-16 DIAGNOSIS — Z1231 Encounter for screening mammogram for malignant neoplasm of breast: Secondary | ICD-10-CM

## 2012-01-16 DIAGNOSIS — Z9882 Breast implant status: Secondary | ICD-10-CM

## 2012-02-06 ENCOUNTER — Other Ambulatory Visit: Payer: Self-pay | Admitting: Nephrology

## 2012-02-06 DIAGNOSIS — R109 Unspecified abdominal pain: Secondary | ICD-10-CM

## 2012-02-06 DIAGNOSIS — Q613 Polycystic kidney, unspecified: Secondary | ICD-10-CM

## 2012-02-12 ENCOUNTER — Other Ambulatory Visit: Payer: Medicare Other

## 2012-02-14 ENCOUNTER — Other Ambulatory Visit: Payer: Medicare Other

## 2012-02-15 ENCOUNTER — Ambulatory Visit
Admission: RE | Admit: 2012-02-15 | Discharge: 2012-02-15 | Disposition: A | Discharge status: Home / Self Care | Payer: Medicare Other | Source: Ambulatory Visit | Attending: Nephrology | Admitting: Nephrology

## 2012-02-15 DIAGNOSIS — Q613 Polycystic kidney, unspecified: Secondary | ICD-10-CM

## 2012-02-15 DIAGNOSIS — R109 Unspecified abdominal pain: Secondary | ICD-10-CM

## 2012-03-15 ENCOUNTER — Other Ambulatory Visit (INDEPENDENT_AMBULATORY_CARE_PROVIDER_SITE_OTHER): Payer: Medicare Other

## 2012-03-15 ENCOUNTER — Telehealth: Payer: Self-pay | Admitting: Family Medicine

## 2012-03-15 DIAGNOSIS — E785 Hyperlipidemia, unspecified: Secondary | ICD-10-CM

## 2012-03-15 DIAGNOSIS — D731 Hypersplenism: Secondary | ICD-10-CM

## 2012-03-15 LAB — COMPREHENSIVE METABOLIC PANEL WITH GFR
ALT: 32 U/L (ref 0–35)
AST: 26 U/L (ref 0–37)
Albumin: 4.3 g/dL (ref 3.5–5.2)
Alkaline Phosphatase: 38 U/L — ABNORMAL LOW (ref 39–117)
BUN: 21 mg/dL (ref 6–23)
CO2: 27 meq/L (ref 19–32)
Calcium: 9.5 mg/dL (ref 8.4–10.5)
Chloride: 109 meq/L (ref 96–112)
Creatinine, Ser: 1 mg/dL (ref 0.4–1.2)
GFR: 60.91 mL/min
Glucose, Bld: 93 mg/dL (ref 70–99)
Potassium: 4 meq/L (ref 3.5–5.1)
Sodium: 143 meq/L (ref 135–145)
Total Bilirubin: 0.6 mg/dL (ref 0.3–1.2)
Total Protein: 7 g/dL (ref 6.0–8.3)

## 2012-03-15 LAB — CBC WITH DIFFERENTIAL/PLATELET
Basophils Absolute: 0 K/uL (ref 0.0–0.1)
Basophils Relative: 0.9 % (ref 0.0–3.0)
Eosinophils Absolute: 0.1 K/uL (ref 0.0–0.7)
Eosinophils Relative: 1.6 % (ref 0.0–5.0)
HCT: 39.4 % (ref 36.0–46.0)
Hemoglobin: 13 g/dL (ref 12.0–15.0)
Lymphocytes Relative: 43.1 % (ref 12.0–46.0)
Lymphs Abs: 2.3 K/uL (ref 0.7–4.0)
MCHC: 33 g/dL (ref 30.0–36.0)
MCV: 92.4 fl (ref 78.0–100.0)
Monocytes Absolute: 0.5 K/uL (ref 0.1–1.0)
Monocytes Relative: 8.9 % (ref 3.0–12.0)
Neutro Abs: 2.4 K/uL (ref 1.4–7.7)
Neutrophils Relative %: 45.5 % (ref 43.0–77.0)
Platelets: 265 K/uL (ref 150.0–400.0)
RBC: 4.26 Mil/uL (ref 3.87–5.11)
RDW: 13.6 % (ref 11.5–14.6)
WBC: 5.3 K/uL (ref 4.5–10.5)

## 2012-03-15 LAB — LIPID PANEL
HDL: 56.1 mg/dL (ref >39.00)
LDL Cholesterol: 106 mg/dL — ABNORMAL HIGH (ref 0–99)
Total CHOL/HDL Ratio: 3

## 2012-03-15 NOTE — Telephone Encounter (Signed)
Message copied by Excell Seltzer on Fri Mar 15, 2012  8:19 AM ------      Message from: Baldomero Lamy      Created: Tue Mar 12, 2012  1:45 PM      Regarding: Cpx labs Fri 1/10       Please order  future cpx labs for pt's upcoming lab appt.      Thanks      Rodney Booze

## 2012-03-20 ENCOUNTER — Other Ambulatory Visit: Payer: Self-pay | Admitting: *Deleted

## 2012-03-20 MED ORDER — FENOFIBRATE 145 MG PO TABS: 145.0000 mg | ORAL_TABLET | Freq: Every day | ORAL | Status: DC

## 2012-03-22 ENCOUNTER — Encounter: Payer: Self-pay | Admitting: Family Medicine

## 2012-03-22 ENCOUNTER — Ambulatory Visit (INDEPENDENT_AMBULATORY_CARE_PROVIDER_SITE_OTHER): Payer: Medicare Other | Admitting: Family Medicine

## 2012-03-22 ENCOUNTER — Other Ambulatory Visit: Payer: Self-pay | Admitting: Family Medicine

## 2012-03-22 VITALS — BP 120/84 | HR 69 | Temp 97.9°F | Ht 64.0 in | Wt 155.2 lb

## 2012-03-22 DIAGNOSIS — E2839 Other primary ovarian failure: Secondary | ICD-10-CM

## 2012-03-22 DIAGNOSIS — R29890 Loss of height: Secondary | ICD-10-CM

## 2012-03-22 DIAGNOSIS — Z0000 Encounter for general adult medical examination without abnormal findings: Secondary | ICD-10-CM

## 2012-03-22 DIAGNOSIS — Z8262 Family history of osteoporosis: Secondary | ICD-10-CM

## 2012-03-22 DIAGNOSIS — E785 Hyperlipidemia, unspecified: Secondary | ICD-10-CM

## 2012-03-22 DIAGNOSIS — K760 Fatty (change of) liver, not elsewhere classified: Secondary | ICD-10-CM

## 2012-03-22 NOTE — Assessment & Plan Note (Signed)
Resolved. Nml LFTS.

## 2012-03-22 NOTE — Assessment & Plan Note (Signed)
Well controlled. Continue current medication.  

## 2012-03-22 NOTE — Patient Instructions (Addendum)
Sydney Ortiz will call you to schedule bone density.   Work on [employer] ,exercsie and weight loss.

## 2012-03-22 NOTE — Progress Notes (Signed)
HPI  I have personally reviewed the Medicare Annual Wellness questionnaire and have noted  1. The patient's medical and social history  2. Their use of alcohol, tobacco or illicit drugs  3. Their current medications and supplements  4. The patient's functional ability including ADL's, fall risks, home safety risks and hearing or visual  impairment.  5. Diet and physical activities  6. Evidence for depression or mood disorders   The patients weight, height, BMI and visual acuity have been recorded in the chart  I have made referrals, counseling and provided education to the patient based review of the above and I have provided the pt with a written personalized care plan for preventive services.   PKD: followed by nephrology. Cr stable. Last saw him in 02/2012.  Nml abdominal US, minimal change in renal cysts. If pain increasing in cysts.. Consider aspiration in summer.  Paroxsysmal PSVT: Several episodes repeat at rest in 02/2012. Resolved on its own.  Abdominal spasm: Followed by [facility] (Dr. Lucretia Roers). On hyoscamine.  Using beano before meals.  Has follow up pending.  Elevated LFTS/fatty liver: nml 03/2012. Fatty liver seen on [place] 2/12   Prediabetes: resolved   Elevated Cholesterol:  Lab Results  Component Value Date   CHOL 187 03/15/2012   HDL 56.10 03/15/2012   LDLCALC 106* 03/15/2012   LDLDIRECT 130.8 11/15/2009   TRIG 127.0 03/15/2012   CHOLHDL 3 03/15/2012   LDL at goal <130, trig at goal on tricor.   Using medications without problems:None  Muscle aches: None  Diet compliance: Low fat, low carb.  Exercise: Daily.. Lifting weights, water aerobics, walking  Other complaints:  Wt Readings from Last 3 Encounters:  03/22/12 155 lb 4 oz (70.421 kg)  06/07/11 156 lb 4 oz (70.875 kg)  03/21/11 154 lb 6.4 oz (70.035 kg)     Review of Systems  Constitutional: Negative for fever, fatigue and unexpected weight change.  HENT: Negative for ear pain, congestion, sore throat,  sneezing, trouble swallowing and sinus pressure.  Eyes: Negative for pain and itching.  Respiratory: Negative for cough, shortness of breath and wheezing.  Cardiovascular: Negative for chest pain, palpitations and leg swelling.  Gastrointestinal: Negative for nausea, abdominal pain, diarrhea, constipation and blood in stool.  Genitourinary: Negative for dysuria, hematuria, vaginal discharge, difficulty urinating and menstrual problem.  Skin: Negative for rash.  Neurological: Negative for syncope, weakness, light-headedness, numbness and headaches.  Psychiatric/Behavioral: Negative for confusion and dysphoric mood. The patient is not nervous/anxious.  Some increase in stress, mild issues with sleep, considering retiring.   Objective:   Physical Exam  Constitutional: Vital signs are normal. She appears well-developed and well-nourished. She is cooperative. Non-toxic appearance. She does not appear ill. No distress.  HENT:  Head: Normocephalic.  Right Ear: Hearing, tympanic membrane, external ear and ear canal normal.  Left Ear: Hearing, tympanic membrane, external ear and ear canal normal.  Nose: Nose normal.  Eyes: Conjunctivae, EOM and lids are normal. Pupils are equal, round, and reactive to light. No foreign bodies found.  Neck: Trachea normal and normal range of motion. Neck supple. Carotid bruit is not present. No mass and no thyromegaly present.  Cardiovascular: Normal rate, regular rhythm, S1 normal, S2 normal, normal heart sounds and intact distal pulses. Exam reveals no gallop.  No murmur heard.  Pulmonary/Chest: Effort normal and breath sounds normal. No respiratory distress. She has no wheezes. She has no rhonchi. She has no rales.  Abdominal: Soft. Normal appearance and bowel sounds are  normal. She exhibits no distension, no fluid wave, no abdominal bruit and no mass. There is no hepatosplenomegaly. There is no tenderness. There is no rebound, no guarding and no CVA tenderness. No  hernia.  Genitourinary: Vagina normal and uterus normal. No breast swelling, tenderness, discharge or bleeding. Pelvic exam was performed with patient prone. There is no rash, tenderness or lesion on the right labia. There is no rash, tenderness or lesion on the left labia. Uterus is not enlarged and not tender. Right adnexum displays no mass, no tenderness and no fullness. Left adnexum displays no mass, no tenderness and no fullness.  No pap performed  Lymphadenopathy:  She has no cervical adenopathy.  She has no axillary adenopathy.  Neurological: She is alert. She has normal strength. No cranial nerve deficit or sensory deficit.  Skin: Skin is warm, dry and intact. No rash noted.  Psychiatric: Her speech is normal and behavior is normal. Judgment normal. Her mood appears not anxious. Cognition and memory are normal. She does not exhibit a depressed mood.    Assessment & Plan:   Welcome to Medicare: The patient's preventative maintenance and recommended screening tests for an annual wellness exam were reviewed in full today.  Brought up to date unless services declined.  Counselled on the importance of diet, exercise, and its role in overall health and mortality.  The patient's FH and SH was reviewed, including their home life, tobacco status, and drug and alcohol status.   Mammo/breast exam: personal history of breast cancer: scheduled, 3 D Vaccines: Uptodate with PNA, Tdap, flu, shingles  DVE/pap:partial hysterectomy, one ovary remains. DVE yearly, no pap indicated.  Colon: Last exam in 2005, repeat due in 2015.  DXA: 05/2009 Normal..due now 2014

## 2012-03-28 ENCOUNTER — Telehealth: Payer: Self-pay | Admitting: Family Medicine

## 2012-03-28 ENCOUNTER — Ambulatory Visit
Admission: RE | Admit: 2012-03-28 | Discharge: 2012-03-28 | Disposition: A | Discharge status: Home / Self Care | Payer: Medicare Other | Source: Ambulatory Visit | Attending: Family Medicine | Admitting: Family Medicine

## 2012-03-28 DIAGNOSIS — E2839 Other primary ovarian failure: Secondary | ICD-10-CM

## 2012-03-28 DIAGNOSIS — Z9011 Acquired absence of right breast and nipple: Secondary | ICD-10-CM

## 2012-03-28 DIAGNOSIS — Z1231 Encounter for screening mammogram for malignant neoplasm of breast: Secondary | ICD-10-CM

## 2012-03-28 DIAGNOSIS — Z9882 Breast implant status: Secondary | ICD-10-CM

## 2012-03-28 NOTE — Telephone Encounter (Signed)
Patient went for her Mammogram today at the breast ctr and this is the patient  That I called the Breast ctr about last Friday before we knew we were having a problem with Medicare Bone Densitys. Anyway we thought the girl from Breast Ctr was able to put the order in for this patient, but when she got there they told her they couldn't do the bone density. I told her we are having problems with these orders now and to call me back in about a month and maybe then the problem will be fixed.

## 2012-05-06 ENCOUNTER — Other Ambulatory Visit: Payer: Medicare Other

## 2012-05-14 ENCOUNTER — Telehealth: Payer: Self-pay | Admitting: Family Medicine

## 2012-05-14 DIAGNOSIS — E2839 Other primary ovarian failure: Secondary | ICD-10-CM

## 2012-05-14 NOTE — Telephone Encounter (Signed)
Entered order

## 2012-06-03 ENCOUNTER — Encounter: Payer: Self-pay | Admitting: Family Medicine

## 2012-06-18 ENCOUNTER — Ambulatory Visit
Admission: RE | Admit: 2012-06-18 | Discharge: 2012-06-18 | Disposition: A | Discharge status: Home / Self Care | Payer: Medicare Other | Source: Ambulatory Visit | Attending: Family Medicine | Admitting: Family Medicine

## 2012-06-18 DIAGNOSIS — E2839 Other primary ovarian failure: Secondary | ICD-10-CM

## 2012-07-05 ENCOUNTER — Other Ambulatory Visit: Payer: Self-pay | Admitting: Nephrology

## 2012-07-05 DIAGNOSIS — R109 Unspecified abdominal pain: Secondary | ICD-10-CM

## 2012-07-05 DIAGNOSIS — Q613 Polycystic kidney, unspecified: Secondary | ICD-10-CM

## 2012-07-11 ENCOUNTER — Telehealth: Payer: Self-pay | Admitting: Family Medicine

## 2012-07-11 NOTE — Telephone Encounter (Signed)
Spoke with Ms. Llorens in detail. Her husband is on life support that will be withdrawn today or tommorow for likely anoxic brain injury. Her family and friends are gathering around her for support. She is managing as well  as can be expected at this time, she will let me know if she needs any medication to sleep or for situational anxiety etc. She does not request any medication at this time.

## 2012-07-12 ENCOUNTER — Other Ambulatory Visit: Payer: Medicare Other

## 2012-07-19 ENCOUNTER — Ambulatory Visit
Admission: RE | Admit: 2012-07-19 | Discharge: 2012-07-19 | Disposition: A | Discharge status: Home / Self Care | Payer: Medicare Other | Source: Ambulatory Visit | Attending: Nephrology | Admitting: Nephrology

## 2012-07-19 DIAGNOSIS — R109 Unspecified abdominal pain: Secondary | ICD-10-CM

## 2012-07-19 DIAGNOSIS — Q613 Polycystic kidney, unspecified: Secondary | ICD-10-CM

## 2012-07-22 ENCOUNTER — Other Ambulatory Visit: Payer: Medicare Other

## 2012-07-26 ENCOUNTER — Other Ambulatory Visit: Payer: Self-pay | Admitting: *Deleted

## 2012-07-26 MED ORDER — FENOFIBRATE 145 MG PO TABS: 145.0000 mg | ORAL_TABLET | Freq: Every day | ORAL | Status: DC

## 2012-10-09 ENCOUNTER — Other Ambulatory Visit: Payer: Self-pay

## 2012-12-09 ENCOUNTER — Telehealth: Payer: Self-pay | Admitting: *Deleted

## 2012-12-09 NOTE — Telephone Encounter (Signed)
Received voicemail from Sydney Ortiz.  States according to My Chart she is due for a pneumovax.  She is scheduled for her flu shot on 12/11/2012 and wants to know if she can get her pneumovax at that appointment.  Per chart, looks like patient is due her 2nd pneumovax.  Spoke with Sydney Ortiz and advised her we can do her 2nd pneumovax at her appointment on 12/11/2012.

## 2012-12-11 ENCOUNTER — Ambulatory Visit (INDEPENDENT_AMBULATORY_CARE_PROVIDER_SITE_OTHER): Payer: Medicare Other | Admitting: *Deleted

## 2012-12-11 ENCOUNTER — Ambulatory Visit: Payer: Self-pay

## 2012-12-11 DIAGNOSIS — Z23 Encounter for immunization: Secondary | ICD-10-CM

## 2013-01-21 ENCOUNTER — Ambulatory Visit (INDEPENDENT_AMBULATORY_CARE_PROVIDER_SITE_OTHER): Payer: Medicare Other | Admitting: Family Medicine

## 2013-01-21 ENCOUNTER — Encounter: Payer: Self-pay | Admitting: Family Medicine

## 2013-01-21 VITALS — BP 106/70 | HR 68 | Temp 97.9°F | Ht 64.0 in | Wt 149.5 lb

## 2013-01-21 DIAGNOSIS — R071 Chest pain on breathing: Secondary | ICD-10-CM

## 2013-01-21 DIAGNOSIS — R0789 Other chest pain: Secondary | ICD-10-CM | POA: Insufficient documentation

## 2013-01-21 NOTE — Progress Notes (Signed)
Pre-visit discussion using our clinic review tool. No additional management support is needed unless otherwise documented below in the visit note.  

## 2013-01-21 NOTE — Patient Instructions (Signed)
Start chest wall stretches. Consider seeing breast surgeon if pain continuing.  Follow up at upcoming physical.  Call sooner if pain increasing or new rash.

## 2013-01-21 NOTE — Progress Notes (Signed)
  Subjective:    Patient ID: Sydney Ortiz, female    DOB: 1945/04/05, 67 y.o.   MRN: [id number]  HPI  67 year old female with history of right breast cancer s/p right mastectomy  And TRAM Flap presents with new onset soreness on central chest wall x 1 month, worse this week. No knot, no lump. No erythema, no rash. Only tender with palpation and if lying on front. No exertional pain. 4-5/10 at worst... Soreness.  After mastectomy (20 years ago) she had chest pain sharp central pain... 4 moths after surgery she had re-eval surgically... Nerve irritation treated.   She has noted during PT for sciatica that when she lies on front.  othrerwise she is doing well overall, dealing with husband's recent death and otherstressers well.  03/2012 nml mammogram.   Review of Systems  Constitutional: Negative for fever and fatigue.  HENT: Negative for ear pain.   Eyes: Negative for pain.  Respiratory: Negative for chest tightness and shortness of breath.   Cardiovascular: Negative for chest pain, palpitations and leg swelling.  Gastrointestinal: Negative for abdominal pain.  Genitourinary: Negative for dysuria.       Objective:   Physical Exam  Constitutional: Vital signs are normal. She appears well-developed and well-nourished. She is cooperative.  Non-toxic appearance. She does not appear ill. No distress.  HENT:  Head: Normocephalic.  Right Ear: Hearing, tympanic membrane, external ear and ear canal normal. Tympanic membrane is not erythematous, not retracted and not bulging.  Left Ear: Hearing, tympanic membrane, external ear and ear canal normal. Tympanic membrane is not erythematous, not retracted and not bulging.  Nose: No mucosal edema or rhinorrhea. Right sinus exhibits no maxillary sinus tenderness and no frontal sinus tenderness. Left sinus exhibits no maxillary sinus tenderness and no frontal sinus tenderness.  Mouth/Throat: Uvula is midline, oropharynx is clear and moist and mucous  membranes are normal.  Eyes: Conjunctivae, EOM and lids are normal. Pupils are equal, round, and reactive to light. Lids are everted and swept, no foreign bodies found.  Neck: Trachea normal and normal range of motion. Neck supple. Carotid bruit is not present. No mass and no thyromegaly present.  Cardiovascular: Normal rate, regular rhythm, S1 normal, S2 normal, normal heart sounds, intact distal pulses and normal pulses.  Exam reveals no gallop and no friction rub.   No murmur heard. Pulmonary/Chest: Effort normal and breath sounds normal. Not tachypneic. No respiratory distress. She has no decreased breath sounds. She has no wheezes. She has no rhonchi. She has no rales. Chest wall is not dull to percussion. She exhibits bony tenderness. She exhibits no mass and no retraction. Right breast exhibits no tenderness. Left breast exhibits no inverted nipple, no mass, no nipple discharge, no skin change and no tenderness.    Area of tenderness along costochondral junction but also greatest at tip of old scar  Right breast with healed scars from reconstruction  Abdominal: Soft. Normal appearance and bowel sounds are normal. There is no tenderness.  Neurological: She is alert.  Skin: Skin is warm, dry and intact. No rash noted.  Psychiatric: Her speech is normal and behavior is normal. Judgment and thought content normal. Her mood appears not anxious. Cognition and memory are normal. She does not exhibit a depressed mood.          Assessment & Plan:

## 2013-01-21 NOTE — Assessment & Plan Note (Signed)
?   costrochodritis , vs less likely shingles without rash vs nerve damage from previous surgeries.  No mass palpated. Treat with chest wall stretches... No NSAIDs give CKD.  Consider surgical consult from breast surgeon if continuing. Mammo planned in 03/2013.  Follow up in 2 month.

## 2013-01-29 ENCOUNTER — Other Ambulatory Visit: Payer: Self-pay

## 2013-01-29 DIAGNOSIS — Z1231 Encounter for screening mammogram for malignant neoplasm of breast: Secondary | ICD-10-CM

## 2013-01-29 DIAGNOSIS — Z9011 Acquired absence of right breast and nipple: Secondary | ICD-10-CM

## 2013-02-25 ENCOUNTER — Other Ambulatory Visit: Payer: Self-pay | Admitting: Family Medicine

## 2013-03-06 DIAGNOSIS — A498 Other bacterial infections of unspecified site: Secondary | ICD-10-CM

## 2013-03-06 HISTORY — DX: Other bacterial infections of unspecified site: A49.8

## 2013-03-18 ENCOUNTER — Other Ambulatory Visit (INDEPENDENT_AMBULATORY_CARE_PROVIDER_SITE_OTHER): Payer: Medicare Other

## 2013-03-18 ENCOUNTER — Telehealth: Payer: Self-pay | Admitting: Family Medicine

## 2013-03-18 DIAGNOSIS — E785 Hyperlipidemia, unspecified: Secondary | ICD-10-CM

## 2013-03-18 LAB — COMPREHENSIVE METABOLIC PANEL
ALK PHOS: 35 U/L — AB (ref 39–117)
ALT: 19 U/L (ref 0–35)
AST: 22 U/L (ref 0–37)
Albumin: 4.3 g/dL (ref 3.5–5.2)
BILIRUBIN TOTAL: 0.7 mg/dL (ref 0.3–1.2)
BUN: 18 mg/dL (ref 6–23)
CO2: 29 mEq/L (ref 19–32)
CREATININE: 1 mg/dL (ref 0.4–1.2)
Calcium: 9.7 mg/dL (ref 8.4–10.5)
Chloride: 108 mEq/L (ref 96–112)
GFR: 58.63 mL/min — ABNORMAL LOW (ref >60.00)
Glucose, Bld: 86 mg/dL (ref 70–99)
Potassium: 4.4 mEq/L (ref 3.5–5.1)
Sodium: 143 mEq/L (ref 135–145)
Total Protein: 7.1 g/dL (ref 6.0–8.3)

## 2013-03-18 LAB — LIPID PANEL
CHOL/HDL RATIO: 3
Cholesterol: 194 mg/dL (ref 0–200)
HDL: 75.6 mg/dL (ref >39.00)
LDL CALC: 104 mg/dL — AB (ref 0–99)
TRIGLYCERIDES: 70 mg/dL (ref 0.0–149.0)
VLDL: 14 mg/dL (ref 0.0–40.0)

## 2013-03-18 NOTE — Telephone Encounter (Signed)
Message copied by Jinny Sanders on Tue Mar 18, 2013  7:33 AM ------      Message from: Ellamae Sia      Created: Fri Mar 07, 2013 10:02 AM      Regarding: Lab orders for Tuesday, 1.13.15       Patient is scheduled for CPX labs, please order future labs, Thanks , Terri       ------

## 2013-03-21 ENCOUNTER — Other Ambulatory Visit: Payer: Self-pay

## 2013-03-21 DIAGNOSIS — Z9011 Acquired absence of right breast and nipple: Secondary | ICD-10-CM

## 2013-03-21 DIAGNOSIS — Z1231 Encounter for screening mammogram for malignant neoplasm of breast: Secondary | ICD-10-CM

## 2013-03-25 ENCOUNTER — Encounter: Payer: Self-pay | Admitting: Family Medicine

## 2013-03-25 ENCOUNTER — Ambulatory Visit (INDEPENDENT_AMBULATORY_CARE_PROVIDER_SITE_OTHER): Payer: Medicare Other | Admitting: Family Medicine

## 2013-03-25 VITALS — BP 110/78 | HR 67 | Temp 97.2°F | Ht 63.0 in | Wt 153.8 lb

## 2013-03-25 DIAGNOSIS — E785 Hyperlipidemia, unspecified: Secondary | ICD-10-CM

## 2013-03-25 DIAGNOSIS — Z0000 Encounter for general adult medical examination without abnormal findings: Secondary | ICD-10-CM

## 2013-03-25 DIAGNOSIS — G4700 Insomnia, unspecified: Secondary | ICD-10-CM

## 2013-03-25 DIAGNOSIS — F5104 Psychophysiologic insomnia: Secondary | ICD-10-CM

## 2013-03-25 MED ORDER — TRAZODONE HCL 50 MG PO TABS: 25.0000 mg | ORAL_TABLET | Freq: Every day | ORAL | Status: DC

## 2013-03-25 NOTE — Patient Instructions (Addendum)
Can try trazodone for sleep. Limit as able.  Hold tricor for 1 week to see if leg cramps resolve.  Get back to exercise as able, continue healthy eating.  Schedule CPX  With fasting labs prior in 1 year.  Call if you need assistance setting up cardiac stress trest and colonoscopy.

## 2013-03-25 NOTE — Progress Notes (Signed)
HPI  I have personally reviewed the Medicare Annual Wellness questionnaire and have noted  1. The patient's medical and social history  2. Their use of alcohol, tobacco or illicit drugs  3. Their current medications and supplements  4. The patient's functional ability including ADL's, fall risks, home safety risks and hearing or visual  impairment.  5. Diet and physical activities  6. Evidence for depression or mood disorders  The patients weight, height, BMI and visual acuity have been recorded in the chart  I have made referrals, counseling and provided education to the patient based review of the above and I have provided the pt with a written personalized care plan for preventive services.   PKD: followed by nephrology. Cr stable. Last saw him in 09/2012.  Reviewed OV note.  Paroxsysmal PSVT: Several episodes repeat at rest in 02/2012. Resolved on its own.  Only one episode after husband passed away.  Abdominal spasm: Followed by [facility] (Dr. Clydene Laming). On hyoscamine.  Using beano before meals.  Has follow up in 05/2013 He recommended cardiac stress test to rule out cardiac source. Will schedule after 05/2013  Elevated LFTS/fatty liver: Fatty liver seen on [place] 2/12   LFTS nl on labs today.  Prediabetes: resolved   Elevated Cholesterol:  LDl at goal < 130. Trigs at goal on tricor. Lab Results  Component Value Date   CHOL 194 03/18/2013   HDL 75.60 03/18/2013   LDLCALC 104* 03/18/2013   LDLDIRECT 130.8 11/15/2009   TRIG 70.0 03/18/2013   CHOLHDL 3 03/18/2013  Using medications without problems: Having a lot of leg cramps. Muscle aches: None  Diet compliance: Low fat, low carb.  Exercise: Daily.. Lifting weights, water aerobics, walking  Other complaints:  Wt Readings from Last 3 Encounters:  03/25/13 153 lb 12 oz (69.741 kg)  01/21/13 149 lb 8 oz (67.813 kg)  03/22/12 155 lb 4 oz (70.421 kg)   At last appt 2 months ago... Right chest wall pain. Pain is now resolved with  stretches.  She has been having some low back pain. Radiating down right leg.   Followed By Dr. Drema Dallas.  MRI lumbar 12/16: advanced DDD, bulging disc on L2 on right, smaller er bulges in l4/l5/S1  ESI last week has helped significantly.  She has been doing PT... Not able to do as much aerobic.Marland Kitchen Plans to start now that she feels better.  Going to hospice support group for loss of husband.  A lot of trouble sleeping at night.  Having a lot of leg cramps... Will stop tricor. Tylenol PM does not help.   Review of Systems  Constitutional: Negative for fever, fatigue and unexpected weight change.  HENT: Negative for ear pain, congestion, sore throat, sneezing, trouble swallowing and sinus pressure.  Eyes: Negative for pain and itching.  Respiratory: Negative for cough, shortness of breath and wheezing.  Cardiovascular: Negative for chest pain, palpitations and leg swelling.  Gastrointestinal: Negative for nausea, abdominal pain, diarrhea, constipation and blood in stool.  Genitourinary: Negative for dysuria, hematuria, vaginal discharge, difficulty urinating and menstrual problem.  Skin: Negative for rash.  Neurological: Negative for syncope, weakness, light-headedness, numbness and headaches.  Psychiatric/Behavioral: Negative for confusion and dysphoric mood. The patient is not nervous/anxious. Some increase in stress, mild issues with sleep, considering retiring.  Objective:   Physical Exam  Constitutional: Vital signs are normal. She appears well-developed and well-nourished. She is cooperative. Non-toxic appearance. She does not appear ill. No distress.  HENT:  Head: Normocephalic.  Right Ear: Hearing, tympanic membrane, external ear and ear canal normal.  Left Ear: Hearing, tympanic membrane, external ear and ear canal normal.  Nose: Nose normal.  Eyes: Conjunctivae, EOM and lids are normal. Pupils are equal, round, and reactive to light. No foreign bodies found.  Neck: Trachea  normal and normal range of motion. Neck supple. Carotid bruit is not present. No mass and no thyromegaly present.  Cardiovascular: Normal rate, regular rhythm, S1 normal, S2 normal, normal heart sounds and intact distal pulses. Exam reveals no gallop.  No murmur heard.  Pulmonary/Chest: Effort normal and breath sounds normal. No respiratory distress. She has no wheezes. She has no rhonchi. She has no rales.  Abdominal: Soft. Normal appearance and bowel sounds are normal. She exhibits no distension, no fluid wave, no abdominal bruit and no mass. There is no hepatosplenomegaly. There is no tenderness. There is no rebound, no guarding and no CVA tenderness. No hernia.  Genitourinary: Vagina normal and uterus normal. No breast swelling, tenderness, discharge or bleeding. Pelvic exam was performed with patient prone. There is no rash, tenderness or lesion on the right labia. There is no rash, tenderness or lesion on the left labia. Uterus is not enlarged and not tender. Right adnexum displays no mass, no tenderness and no fullness. Left adnexum displays no mass, no tenderness and no fullness.  No pap performed  Lymphadenopathy:  She has no cervical adenopathy.  She has no axillary adenopathy.  Neurological: She is alert. She has normal strength. No cranial nerve deficit or sensory deficit.  Skin: Skin is warm, dry and intact. No rash noted.  Psychiatric: Her speech is normal and behavior is normal. Judgment normal. Her mood appears not anxious. Cognition and memory are normal. She does not exhibit a depressed mood.  Assessment & Plan:   Welcome to Medicare: The patient's preventative maintenance and recommended screening tests for an annual wellness exam were reviewed in full today.  Brought up to date unless services declined.  Counselled on the importance of diet, exercise, and its role in overall health and mortality.  The patient's FH and SH was reviewed, including their home life, tobacco status,  and drug and alcohol status.   Mammo/breast exam: personal history of breast cancer: scheduled next week, 3 D  Vaccines: Uptodate with PNA, flu, shingles, TDap given last year DVE/pap:partial hysterectomy, one ovary remains. DVE yearly, no pap indicated.  Colon: Last exam in 2005, repeat due in 2015... Plans later this year with Dr. Carlean Purl. DXA: 05/2012 Normal..due in 2-5 years Former smoker minimal use.

## 2013-03-25 NOTE — Progress Notes (Signed)
Pre-visit discussion using our clinic review tool. No additional management support is needed unless otherwise documented below in the visit note.  

## 2013-03-31 ENCOUNTER — Other Ambulatory Visit: Payer: Self-pay | Admitting: Family Medicine

## 2013-03-31 ENCOUNTER — Ambulatory Visit
Admission: RE | Admit: 2013-03-31 | Discharge: 2013-03-31 | Disposition: A | Discharge status: Home / Self Care | Payer: Medicare Other | Source: Ambulatory Visit

## 2013-03-31 DIAGNOSIS — Z9011 Acquired absence of right breast and nipple: Secondary | ICD-10-CM

## 2013-03-31 DIAGNOSIS — Z1231 Encounter for screening mammogram for malignant neoplasm of breast: Secondary | ICD-10-CM

## 2013-04-08 DIAGNOSIS — F5104 Psychophysiologic insomnia: Secondary | ICD-10-CM | POA: Insufficient documentation

## 2013-04-08 NOTE — Assessment & Plan Note (Signed)
Trial of trazodone. 

## 2013-04-08 NOTE — Assessment & Plan Note (Signed)
Hold tricor for 1 week to see if leg cramps resolve.  Get back to exercise as able, continue healthy eating.

## 2013-07-11 ENCOUNTER — Encounter: Payer: Medicare Other | Admitting: Cardiology

## 2013-07-30 ENCOUNTER — Ambulatory Visit (INDEPENDENT_AMBULATORY_CARE_PROVIDER_SITE_OTHER): Payer: Medicare Other | Admitting: Cardiology

## 2013-07-30 ENCOUNTER — Encounter: Payer: Self-pay | Admitting: *Deleted

## 2013-07-30 DIAGNOSIS — Q613 Polycystic kidney, unspecified: Secondary | ICD-10-CM

## 2013-07-30 DIAGNOSIS — R079 Chest pain, unspecified: Secondary | ICD-10-CM

## 2013-07-30 DIAGNOSIS — E785 Hyperlipidemia, unspecified: Secondary | ICD-10-CM

## 2013-07-30 NOTE — Progress Notes (Signed)
Patient ID: Sydney Ortiz, female   DOB: 07/03/1945, 68 y.o.   MRN: [id number] PCP: Dr. Diona Browner  68 yo with history of esophageal dysmotility presents for evaluation of chest pain. Patient has had chest pain chronically for a number of years now. It can happen at any time and is not particularly related to exertion. She feels it as a burning pain in her central chest and in the epigastric area. It occurs most often at night. She does not think that it is associated with eating meals. It occurs multiple times a week. It occurs more often when she is under stress and seems to be more common since her husband passed away last year.  She has tried reflux treatments without significant relief. She has been evaluated by GI at [facility] who noted esophageal dysmotility. Workup for scleroderma was negative.  Symptoms resolve with hyoscyamine or if she drinks water fast.  She had a negative Myoview in 10/10.  She has rare heart fluttering lasting for a few seconds.  No exertional dyspnea.  She is working.   Her GI physician sent her for consideration of repeat stress testing given some worsening of chest pain.   ECG: NSR, normal   Labs (1/15): K 4.5, creatinine 0.95, LDL 104, HDL 76  Past Medical History:  1. SUPRAVENTRICULAR TACHYCARDIA, PAROXYSMAL: 5 beat run noted on holter monitor in 2005.  2. POLYCYSTIC KIDNEY DISEASE: Followed by Dr. Marval Regal.  3. MIGRAINE HEADACHE  4. OSTEOARTHRITIS  5. HYPERLIPIDEMIA: Elevated triglycerides.  6. GERD: 24 hour pH study did not show significant acid reflux.  7. BREAST CANCER: status post mastectomy in 1994.  8. ASTHMA  9. ALLERGIC RHINITIS  10. Impaired fasting glucose.  11. Esophageal dysmotility: Severe. She has been evaluated by rheumatology at [facility] but no connective tissue disease diagnosis was made. She was ANA positive but anti-SCL 70 negative.  12. 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  13. Ischemic evaluation: Negative cardiolite, Echo with EF 65%,  Holter with 5 beat run SVT. All of this was done in [place].  ETT-Cardiolite in 10/10 with 12:15 exercise, EF 73%, no ischemia or infarction.   Family History:  Father: HTN, arthritis  Mother: hypoglycemia, valve dz, polycystic kidney diz., CVA, broken hip, dementia  Siblings: 3 brothers, one healthy, one with endocarditis who died at age 56 with heart attack and one with HTN, ulcers  DM: Maternal uncle, aunt  Prostate CA: Father   Social History:  Former Smoker  Alcohol use-yes, moderate  Drug use-no  Regular exercise-yes, treadmill, 2 miles 3 x/week, walks 4 miles daily  Marital Status: Married  Children: 2 adopted children (son and daughter)  Originally from [place], moved to [place] from [place]  Occupation: [employer] - retired  Diet: Avoids MSG (triggers HA), cultured cheese, coloring   Review of Systems  All systems reviewed and negative except as per HPI.   Assessment/Plan:  1. Chest pain: I suspect the patient's symptoms are from esophageal dysmotility (this has been the etiology in the past).   Hyoscyamine resolves pain.  She does not have exertional symptoms.  Increased stress level may trigger more pain episodes. Given some worsening, I will arrange for ETT-Cardiolite.  2. Hyperlipidemia: Good lipids in 1/15.  3. APKD: Followed by Dr. Marval Regal.  Creatinine was 0.95 in 1/15.   Larey Dresser 07/30/2013

## 2013-07-30 NOTE — Patient Instructions (Signed)
Your physician has requested that you have en exercise stress myoview. For further information please visit [email or web address]. Please follow instruction sheet, as given.  Your physician recommends that you schedule a follow-up appointment as needed with Dr McLean.      

## 2013-07-31 NOTE — Addendum Note (Signed)
Addended by: Katrine Coho on: 07/31/2013 09:31 AM   Modules accepted: Orders

## 2013-08-07 ENCOUNTER — Encounter: Payer: Self-pay | Admitting: Cardiology

## 2013-08-12 ENCOUNTER — Ambulatory Visit (HOSPITAL_COMMUNITY): Payer: Medicare Other | Attending: Cardiovascular Disease | Admitting: Radiology

## 2013-08-12 VITALS — BP 125/70 | Ht 64.0 in | Wt 153.0 lb

## 2013-08-12 DIAGNOSIS — R079 Chest pain, unspecified: Secondary | ICD-10-CM

## 2013-08-12 DIAGNOSIS — Z87891 Personal history of nicotine dependence: Secondary | ICD-10-CM | POA: Insufficient documentation

## 2013-08-12 DIAGNOSIS — I4949 Other premature depolarization: Secondary | ICD-10-CM

## 2013-08-12 DIAGNOSIS — R002 Palpitations: Secondary | ICD-10-CM | POA: Insufficient documentation

## 2013-08-12 DIAGNOSIS — R0789 Other chest pain: Secondary | ICD-10-CM | POA: Insufficient documentation

## 2013-08-12 DIAGNOSIS — R0602 Shortness of breath: Secondary | ICD-10-CM

## 2013-08-12 MED ORDER — TECHNETIUM TC 99M SESTAMIBI GENERIC - CARDIOLITE
10.8000 | Freq: Once | INTRAVENOUS | Status: AC | PRN
  Administered 2013-08-12: 11 via INTRAVENOUS

## 2013-08-12 MED ORDER — TECHNETIUM TC 99M SESTAMIBI GENERIC - CARDIOLITE
33.0000 | Freq: Once | INTRAVENOUS | Status: AC | PRN
  Administered 2013-08-12: 33 via INTRAVENOUS

## 2013-08-12 NOTE — Progress Notes (Addendum)
[place] [place] [address] [place], [place] [address] [phone]    Cardiology Nuclear Med Study  Sydney Ortiz is a 68 y.o. female     MRN : [id number]     DOB: 1945-12-02  Procedure Date: 08/12/2013  Nuclear Med Background Indication for Stress Test:  Evaluation for Ischemia History:  '05 Echo:EF=65%;'10 MPI:EF=73% and normal Cardiac Risk Factors: History of Smoking and Lipids  Symptoms:  Chest Pain at Rest and with Exertion (last date of chest discomfort yesterday) and Palpitations   Nuclear Pre-Procedure Caffeine/Decaff Intake:  None> 12 hrs NPO After: 9:30pm   Lungs:  clear O2 Sat: 97% on room air. IV 0.9% NS with Angio Cath:  22g  IV Site: L Wrist x 1, tolerated well IV Started by:  Irven Baltimore, RN  Chest Size (in):  34 Cup Size: C, (R) Breast Implant  Height: 5\' 4"  (1.626 m)  Weight:  153 lb (69.4 kg)  BMI:  Body mass index is 26.25 kg/(m^2). Tech Comments:  N/A    Nuclear Med Study 1 or 2 day study: 1 day  Stress Test Type:  Stress  Reading MD: N/A  Order Authorizing Provider:  Loralie Champagne, MD  Resting Radionuclide: Technetium 2m Sestamibi  Resting Radionuclide Dose: 11.0 mCi   Stress Radionuclide:  Technetium 60m Sestamibi  Stress Radionuclide Dose: 33.0 mCi           Stress Protocol Rest HR: 58 Stress HR: 144  Rest BP: 125/70 Stress BP: 155/73  Exercise Time (min): 10:32 METS: 11.6   Predicted Max HR: 152 bpm % Max HR: 94.74 bpm Rate Pressure Product: 22320   Dose of Adenosine (mg):  n/a Dose of Lexiscan: n/a mg  Dose of Atropine (mg): n/a Dose of Dobutamine: n/a mcg/kg/min (at max HR)  Stress Test Technologist: Matilde Haymaker, RN  Nuclear Technologist:  Vedia Pereyra, CNMT     Rest Procedure:  Myocardial perfusion imaging was performed at rest 45 minutes following the intravenous administration of Technetium 61m Sestamibi. Rest ECG: NSR - Normal EKG  Stress Procedure:  The patient exercised on the treadmill  utilizing the Bruce Protocol for 10:32 minutes. The patient stopped due to fatigue and denied any chest pain.  Technetium 37m Sestamibi was injected at peak exercise and myocardial perfusion imaging was performed after a brief delay. Stress ECG: No significant change from baseline ECG  QPS Raw Data Images:  Normal; no motion artifact; normal heart/lung ratio. Stress Images:  Normal homogeneous uptake in all areas of the myocardium. Rest Images:  Normal homogeneous uptake in all areas of the myocardium. Subtraction (SDS):  No evidence of ischemia. Transient Ischemic Dilatation (Normal <1.22):  0.80 Lung/Heart Ratio (Normal <0.45):  0.28  Quantitative Gated Spect Images QGS EDV:  62 ml QGS ESV:  16 ml  Impression Exercise Capacity:  Good exercise capacity. BP Response:  Normal blood pressure response. Clinical Symptoms:  No chest pain. ECG Impression:  No significant ST segment change suggestive of ischemia. Comparison with Prior Nuclear Study: No significant change from previous study  Overall Impression:  Normal stress nuclear study.  LV Ejection Fraction: 73%.  LV Wall Motion:  NL LV Function; NL Wall Motion  Darlin Coco MD  Normal study, please tell patient .  Loralie Champagne 08/13/2013

## 2013-08-13 NOTE — Progress Notes (Signed)
Pt.notified

## 2013-08-14 ENCOUNTER — Encounter: Payer: Medicare Other | Admitting: Cardiology

## 2013-09-24 ENCOUNTER — Other Ambulatory Visit: Payer: Self-pay | Admitting: Nephrology

## 2013-09-24 DIAGNOSIS — Q613 Polycystic kidney, unspecified: Secondary | ICD-10-CM

## 2013-10-01 ENCOUNTER — Other Ambulatory Visit: Payer: Medicare Other

## 2013-10-03 ENCOUNTER — Ambulatory Visit
Admission: RE | Admit: 2013-10-03 | Discharge: 2013-10-03 | Disposition: A | Discharge status: Home / Self Care | Payer: Medicare Other | Source: Ambulatory Visit | Attending: Nephrology | Admitting: Nephrology

## 2013-10-03 DIAGNOSIS — Q613 Polycystic kidney, unspecified: Secondary | ICD-10-CM

## 2013-11-07 ENCOUNTER — Other Ambulatory Visit: Payer: Self-pay | Admitting: Family Medicine

## 2013-12-12 ENCOUNTER — Ambulatory Visit (INDEPENDENT_AMBULATORY_CARE_PROVIDER_SITE_OTHER): Payer: Medicare Other | Admitting: Family Medicine

## 2013-12-12 ENCOUNTER — Encounter: Payer: Self-pay | Admitting: Family Medicine

## 2013-12-12 VITALS — BP 94/64 | HR 85 | Temp 98.0°F | Ht 63.0 in | Wt 158.0 lb

## 2013-12-12 DIAGNOSIS — R197 Diarrhea, unspecified: Secondary | ICD-10-CM

## 2013-12-12 DIAGNOSIS — L200 Besnier's prurigo: Secondary | ICD-10-CM

## 2013-12-12 DIAGNOSIS — L239 Allergic contact dermatitis, unspecified cause: Secondary | ICD-10-CM

## 2013-12-12 DIAGNOSIS — K529 Noninfective gastroenteritis and colitis, unspecified: Secondary | ICD-10-CM | POA: Insufficient documentation

## 2013-12-12 MED ORDER — TRIAMCINOLONE ACETONIDE 0.5 % EX CREA: 1.0000 "application " | TOPICAL_CREAM | Freq: Two times a day (BID) | CUTANEOUS | Status: DC

## 2013-12-12 NOTE — Assessment & Plan Note (Signed)
Most likely viral /travelers diarrhea secondary to recent travel vs IBS secondary to stress and travel.  If not continuing to improve, consider course of cipro.

## 2013-12-12 NOTE — Progress Notes (Signed)
   Subjective:    Patient ID: Sydney Ortiz, female    DOB: 23-Sep-1945, 68 y.o.   MRN: [id number]  HPI 68 year old female presents with new onset rash and ithcing on forehead, cheeks and nose.  She notes it off and on  Present for 3-4 days, then goes away on its own. Rash not presents today. Rash is itchy and red. Drys up and starts flaking/peeling. Feels like burning pain in sun. Not triggered with sun. Cortisone cream helps some.  No new meds, No new exposures.  She has been fatigued somewhat as well. She has noted since being back from [place] she has softer stools. No abdominal pain.      Review of Systems  Constitutional: Negative for fever and fatigue.  HENT: Negative for ear pain.   Eyes: Negative for pain.  Respiratory: Negative for chest tightness and shortness of breath.   Cardiovascular: Negative for chest pain, palpitations and leg swelling.  Gastrointestinal: Negative for abdominal pain.  Genitourinary: Negative for dysuria.       Objective:   Physical Exam  Constitutional: Vital signs are normal. She appears well-developed and well-nourished. She is cooperative.  Non-toxic appearance. She does not appear ill. No distress.  HENT:  Head: Normocephalic.  Right Ear: Hearing, tympanic membrane, external ear and ear canal normal. Tympanic membrane is not erythematous, not retracted and not bulging.  Left Ear: Hearing, tympanic membrane, external ear and ear canal normal. Tympanic membrane is not erythematous, not retracted and not bulging.  Nose: No mucosal edema or rhinorrhea. Right sinus exhibits no maxillary sinus tenderness and no frontal sinus tenderness. Left sinus exhibits no maxillary sinus tenderness and no frontal sinus tenderness.  Mouth/Throat: Uvula is midline, oropharynx is clear and moist and mucous membranes are normal.  Eyes: Conjunctivae, EOM and lids are normal. Pupils are equal, round, and reactive to light. Lids are everted and swept, no foreign  bodies found.  Neck: Trachea normal and normal range of motion. Neck supple. Carotid bruit is not present. No mass and no thyromegaly present.  Cardiovascular: Normal rate, regular rhythm, S1 normal, S2 normal, normal heart sounds, intact distal pulses and normal pulses.  Exam reveals no gallop and no friction rub.   No murmur heard. Pulmonary/Chest: Effort normal and breath sounds normal. Not tachypneic. No respiratory distress. She has no decreased breath sounds. She has no wheezes. She has no rhonchi. She has no rales.  Abdominal: Soft. Normal appearance and bowel sounds are normal. There is no tenderness.  Neurological: She is alert.  Skin: Skin is warm, dry and intact. No rash noted.  Red scalp, dry flaky skin on bilateral sides of forehead, no redness or blisters or pustules  Psychiatric: Her speech is normal and behavior is normal. Judgment and thought content normal. Her mood appears not anxious. Cognition and memory are normal. She does not exhibit a depressed mood.          Assessment & Plan:

## 2013-12-12 NOTE — Patient Instructions (Addendum)
Push fluids. If diarrhea not continuing to improve, call for further evaluation. Start triamcinolone cream on face twice daily x 2 weeks.

## 2013-12-12 NOTE — Progress Notes (Signed)
Pre visit review using our clinic review tool, if applicable. No additional management support is needed unless otherwise documented below in the visit note. 

## 2013-12-12 NOTE — Assessment & Plan Note (Signed)
?   Trigger.? Hair dye Treat with topical steroid. Follow up if not improving as expected.

## 2013-12-18 ENCOUNTER — Encounter: Payer: Self-pay | Admitting: Family Medicine

## 2013-12-19 ENCOUNTER — Other Ambulatory Visit: Payer: Self-pay | Admitting: Family Medicine

## 2013-12-19 MED ORDER — CIPROFLOXACIN HCL 250 MG PO TABS: 250.0000 mg | ORAL_TABLET | Freq: Two times a day (BID) | ORAL | Status: DC

## 2013-12-29 ENCOUNTER — Encounter: Payer: Self-pay | Admitting: Family Medicine

## 2014-01-06 ENCOUNTER — Encounter: Payer: Self-pay | Admitting: Family Medicine

## 2014-01-06 ENCOUNTER — Ambulatory Visit (INDEPENDENT_AMBULATORY_CARE_PROVIDER_SITE_OTHER): Payer: Medicare Other | Admitting: Family Medicine

## 2014-01-06 VITALS — BP 90/64 | HR 61 | Temp 97.9°F | Ht 63.0 in | Wt 151.0 lb

## 2014-01-06 DIAGNOSIS — R634 Abnormal weight loss: Secondary | ICD-10-CM

## 2014-01-06 DIAGNOSIS — R197 Diarrhea, unspecified: Secondary | ICD-10-CM

## 2014-01-06 NOTE — Progress Notes (Signed)
Pre visit review using our clinic review tool, if applicable. No additional management support is needed unless otherwise documented below in the visit note. 

## 2014-01-06 NOTE — Addendum Note (Signed)
Addended by: Marchia Bond on: 01/06/2014 02:45 PM   Modules accepted: Orders

## 2014-01-06 NOTE — Addendum Note (Signed)
Addended by: Daralene Milch C on: 01/06/2014 11:28 AM   Modules accepted: Orders

## 2014-01-06 NOTE — Patient Instructions (Addendum)
Hold supplement. Stop at lab to leave stool sample. Hold dairy products. Push fluids. Continue probiotic.

## 2014-01-06 NOTE — Assessment & Plan Note (Signed)
Given continue diarrhea now for 1 month and recent trip to [place] along with 7 lb weight loss in 1 month... We will eval with stool tests for cdiff, bacteria and parasites.

## 2014-01-06 NOTE — Progress Notes (Signed)
Subjective:    Patient ID: Sydney Ortiz, female    DOB: [birth year], 68 y.o.   MRN: [id number]  HPI    68 year old female presents for continue stomach upset.  She was seen for eval on 12/12/2013 after returning from a trip to [place]. At that time she was noted to have fatigue and softer stools. 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  Given she did not improve in the next week: She was  treated with cipro x 10 days for possible traveler's diarrhea.    She reports that initially her stool improved but when off she had recurrence of loose stools, occ watery.  Rare central abdominal apin that feels liek gas. She has decrease her po intake because when she eats more she has symptoms.  During day she is fine, but diarrhea starts at 5 PM. Occ BMs in middle of night.  she is drinking a lot of fluids.  She is not under a lot of stress.   Sh is going on trip to [place]. She has cut down dairy products.. Has helped some.  No new meds except she changed her vitamin prior to having diarrhea start. One vitamin has green tea. No high dose magnesium.  She is taking probiotic.    Nml wbc, Cr with recent labs at Dr. Judie Petit.   She has lost 7 lbs in the last month. Wt Readings from Last 3 Encounters:  01/06/14 151 lb (68.493 kg)  12/12/13 158 lb (71.668 kg)  08/12/13 153 lb (69.4 kg)      She has a history of GERD and fatty liver as well as chronic abdominal pain . Followed by GI. Last colonoscpoy 09/2013 at [facility] except for few wide mouth diverticuli.     Review of Systems  Constitutional: Negative for fever and fatigue.  HENT: Negative for ear pain.   Eyes: Negative for pain.  Respiratory: Negative for chest tightness and shortness of breath.   Cardiovascular: Negative for chest pain, palpitations and leg swelling.  Gastrointestinal: Negative for abdominal pain.  Genitourinary: Negative for dysuria.       Objective:   Physical Exam    Constitutional: Vital signs are normal. She appears well-developed and well-nourished. She is cooperative.  Non-toxic appearance. She does not appear ill. No distress.  HENT:  Head: Normocephalic.  Right Ear: Hearing, tympanic membrane, external ear and ear canal normal. Tympanic membrane is not erythematous, not retracted and not bulging.  Left Ear: Hearing, tympanic membrane, external ear and ear canal normal. Tympanic membrane is not erythematous, not retracted and not bulging.  Nose: No mucosal edema or rhinorrhea. Right sinus exhibits no maxillary sinus tenderness and no frontal sinus tenderness. Left sinus exhibits no maxillary sinus tenderness and no frontal sinus tenderness.  Mouth/Throat: Uvula is midline, oropharynx is clear and moist and mucous membranes are normal.  Eyes: Conjunctivae, EOM and lids are normal. Pupils are equal, round, and reactive to light. Lids are everted and swept, no foreign bodies found.  Neck: Trachea normal and normal range of motion. Neck supple. Carotid bruit is not present. No thyroid mass and no thyromegaly present.  Cardiovascular: Normal rate, regular rhythm, S1 normal, S2 normal, normal heart sounds, intact distal pulses and normal pulses.  Exam reveals no gallop and no friction rub.   No murmur heard. Pulmonary/Chest: Effort normal and breath sounds normal. No tachypnea. No respiratory distress. She has no decreased breath sounds. She has no wheezes. She  has no rhonchi. She has no rales.  Abdominal: Soft. Normal appearance and bowel sounds are normal. There is tenderness in the right lower quadrant.  Very mild ttp  Neurological: She is alert.  Skin: Skin is warm, dry and intact. No rash noted.  Psychiatric: Her speech is normal and behavior is normal. Judgment and thought content normal. Her mood appears not anxious. Cognition and memory are normal. She does not exhibit a depressed mood.          Assessment & Plan:

## 2014-01-07 NOTE — Addendum Note (Signed)
Addended by: Ellamae Sia on: 01/07/2014 10:33 AM   Modules accepted: Orders

## 2014-01-08 LAB — C. DIFFICILE GDH AND TOXIN A/B
C. difficile GDH: NOT DETECTED
C. difficile Toxin A/B: NOT DETECTED

## 2014-01-08 LAB — GIARDIA/CRYPTOSPORIDIUM (EIA)
CRYPTOSPORIDIUM SCREEN (EIA) (SOL): NEGATIVE
Giardia Screen (EIA): NEGATIVE

## 2014-01-09 ENCOUNTER — Encounter: Payer: Self-pay | Admitting: Family Medicine

## 2014-01-11 LAB — STOOL CULTURE

## 2014-01-14 ENCOUNTER — Encounter: Payer: Self-pay | Admitting: Family Medicine

## 2014-01-16 NOTE — Telephone Encounter (Signed)
Please provide last OV as requested for pt to pick up.

## 2014-01-21 ENCOUNTER — Ambulatory Visit (INDEPENDENT_AMBULATORY_CARE_PROVIDER_SITE_OTHER): Payer: Medicare Other | Admitting: *Deleted

## 2014-01-21 ENCOUNTER — Ambulatory Visit: Payer: Medicare Other

## 2014-01-21 DIAGNOSIS — Z23 Encounter for immunization: Secondary | ICD-10-CM

## 2014-02-05 ENCOUNTER — Encounter: Payer: Self-pay | Admitting: Family Medicine

## 2014-02-06 ENCOUNTER — Other Ambulatory Visit: Payer: Self-pay

## 2014-02-06 DIAGNOSIS — Z9011 Acquired absence of right breast and nipple: Secondary | ICD-10-CM

## 2014-02-06 DIAGNOSIS — Z1231 Encounter for screening mammogram for malignant neoplasm of breast: Secondary | ICD-10-CM

## 2014-03-12 ENCOUNTER — Encounter: Payer: Self-pay | Admitting: Internal Medicine

## 2014-04-01 ENCOUNTER — Ambulatory Visit
Admission: RE | Admit: 2014-04-01 | Discharge: 2014-04-01 | Disposition: A | Discharge status: Home / Self Care | Payer: Medicare Other | Source: Ambulatory Visit

## 2014-04-01 DIAGNOSIS — Z1231 Encounter for screening mammogram for malignant neoplasm of breast: Secondary | ICD-10-CM

## 2014-04-01 DIAGNOSIS — Z9011 Acquired absence of right breast and nipple: Secondary | ICD-10-CM

## 2014-04-09 ENCOUNTER — Other Ambulatory Visit: Payer: Self-pay | Admitting: Family Medicine

## 2014-04-16 ENCOUNTER — Telehealth: Payer: Self-pay | Admitting: Family Medicine

## 2014-04-16 ENCOUNTER — Other Ambulatory Visit (INDEPENDENT_AMBULATORY_CARE_PROVIDER_SITE_OTHER): Payer: Medicare Other

## 2014-04-16 DIAGNOSIS — E785 Hyperlipidemia, unspecified: Secondary | ICD-10-CM

## 2014-04-16 LAB — COMPREHENSIVE METABOLIC PANEL
ALK PHOS: 58 U/L (ref 39–117)
ALT: 22 U/L (ref 0–35)
AST: 21 U/L (ref 0–37)
Albumin: 4.1 g/dL (ref 3.5–5.2)
BUN: 12 mg/dL (ref 6–23)
CO2: 28 mEq/L (ref 19–32)
Calcium: 9.6 mg/dL (ref 8.4–10.5)
Chloride: 107 mEq/L (ref 96–112)
Creatinine, Ser: 0.92 mg/dL (ref 0.40–1.20)
GFR: 64.35 mL/min (ref >60.00)
Glucose, Bld: 82 mg/dL (ref 70–99)
Potassium: 3.9 mEq/L (ref 3.5–5.1)
Sodium: 140 mEq/L (ref 135–145)
Total Bilirubin: 0.4 mg/dL (ref 0.2–1.2)
Total Protein: 6.8 g/dL (ref 6.0–8.3)

## 2014-04-16 LAB — LIPID PANEL
CHOL/HDL RATIO: 3
Cholesterol: 141 mg/dL (ref 0–200)
HDL: 45.8 mg/dL (ref >39.00)
LDL CALC: 78 mg/dL (ref 0–99)
NONHDL: 95.2
TRIGLYCERIDES: 87 mg/dL (ref 0.0–149.0)
VLDL: 17.4 mg/dL (ref 0.0–40.0)

## 2014-04-16 NOTE — Telephone Encounter (Signed)
-----   Message from Ellamae Sia sent at 04/14/2014  9:42 AM EST ----- Regarding: Lab orders for Thursday, 2.11.16 Patient is scheduled for CPX labs, please order future labs, Thanks , Karna Christmas

## 2014-04-23 ENCOUNTER — Encounter: Payer: Self-pay | Admitting: Family Medicine

## 2014-04-23 ENCOUNTER — Ambulatory Visit (INDEPENDENT_AMBULATORY_CARE_PROVIDER_SITE_OTHER): Payer: Medicare Other | Admitting: Family Medicine

## 2014-04-23 VITALS — BP 98/60 | HR 57 | Temp 97.6°F | Ht 62.75 in | Wt 140.2 lb

## 2014-04-23 DIAGNOSIS — E785 Hyperlipidemia, unspecified: Secondary | ICD-10-CM

## 2014-04-23 DIAGNOSIS — K529 Noninfective gastroenteritis and colitis, unspecified: Secondary | ICD-10-CM

## 2014-04-23 DIAGNOSIS — Z0000 Encounter for general adult medical examination without abnormal findings: Secondary | ICD-10-CM

## 2014-04-23 DIAGNOSIS — Z7189 Other specified counseling: Secondary | ICD-10-CM

## 2014-04-23 NOTE — Progress Notes (Signed)
HPI  I have personally reviewed the Medicare Annual Wellness questionnaire and have noted  1. The patient's medical and social history  2. Their use of alcohol, tobacco or illicit drugs  3. Their current medications and supplements  4. The patient's functional ability including ADL's, fall risks, home safety risks and hearing or visual  impairment.  5. Diet and physical activities  6. Evidence for depression or mood disorders  The patients weight, height, BMI and visual acuity have been recorded in the chart  I have made referrals, counseling and provided education to the patient based review of the above and I have provided the pt with a written personalized care plan for preventive services.   She is moving to [facility] apartment.  She was diagnosed with Cdiffilce at [facility] Dr. Clydene Laming GI. S/P course of vancomycin, but was unable to complete course given SE   Recent Cdiff was negative despite still having diarrhea at night. NO fevers, no abdominal pain.Marland Kitchen He felt it was postinfectious diarrhea. Using fiber diet. Using align. She has lost 18 lbs in last 6 months.  Recently stopped nexium given concerns of cdiff. NO GERD as of yet.  PKD: followed by nephrology. Cr stable. Last saw him in 9/15. Reviewed OV note.  Paroxsysmal PSVT: Several episodes repeat at rest in 02/2012. Resolved on its own. Only one episode after husband passed away.  Abdominal spasm: Followed by [facility] (Dr. Clydene Laming). On hyoscamine.  Using beano before meals.  Has follow up in 05/2013 He recommended cardiac stress test to rule out cardiac source. Will schedule after 05/2013  Elevated LFTS/fatty liver: Fatty liver seen on [place] 2/12  LFTS nl on labs today.  Prediabetes: resolved   Elevated Cholesterol: LDl at goal < 130. Trigs at goal on tricor. No further muscle ache/ cramps. Lab Results  Component Value Date   CHOL 141 04/16/2014   HDL 45.80 04/16/2014   LDLCALC 78 04/16/2014   LDLDIRECT 130.8  11/15/2009   TRIG 87.0 04/16/2014   CHOLHDL 3 04/16/2014   Wt Readings from Last 3 Encounters:  04/23/14 140 lb 4 oz (63.617 kg)  01/06/14 151 lb (68.493 kg)  12/12/13 158 lb (71.668 kg)    Review of Systems  Constitutional: Negative for fever, fatigue and unexpected weight change.  HENT: Negative for ear pain, congestion, sore throat, sneezing, trouble swallowing and sinus pressure.  Eyes: Negative for pain and itching.  Respiratory: Negative for cough, shortness of breath and wheezing.  Cardiovascular: Negative for chest pain, palpitations and leg swelling.  Gastrointestinal: Negative for nausea, abdominal pain, diarrhea, constipation and blood in stool.  Genitourinary: Negative for dysuria, hematuria, vaginal discharge, difficulty urinating and menstrual problem.  Skin: Negative for rash.  Neurological: Negative for syncope, weakness, light-headedness, numbness and headaches.  Psychiatric/Behavioral: Negative for confusion and dysphoric mood. The patient is not nervous/anxious. Some increase in stress, sleeping better than before Objective:   Physical Exam  Constitutional: Vital signs are normal. She appears well-developed and well-nourished. She is cooperative. Non-toxic appearance. She does not appear ill. No distress.  HENT:  Head: Normocephalic.  Right Ear: Hearing, tympanic membrane, external ear and ear canal normal.  Left Ear: Hearing, tympanic membrane, external ear and ear canal normal.  Nose: Nose normal.  Eyes: Conjunctivae, EOM and lids are normal. Pupils are equal, round, and reactive to light. No foreign bodies found.  Neck: Trachea normal and normal range of motion. Neck supple. Carotid bruit is not present. No mass and no thyromegaly present.  Cardiovascular: Normal rate, regular rhythm, S1 normal, S2 normal, normal heart sounds and intact distal pulses. Exam reveals no gallop.  No murmur heard.  Pulmonary/Chest: Effort normal and breath sounds  normal. No respiratory distress. She has no wheezes. She has no rhonchi. She has no rales.  Abdominal: Soft. Normal appearance and bowel sounds are normal. She exhibits no distension, no fluid wave, no abdominal bruit and no mass. There is no hepatosplenomegaly. There is no tenderness. There is no rebound, no guarding and no CVA tenderness. No hernia.  Genitourinary: Vagina normal and uterus normal. No breast swelling, tenderness, discharge or bleeding. Pelvic exam was performed with patient prone. There is no rash, tenderness or lesion on the right labia. There is no rash, tenderness or lesion on the left labia. Uterus is not enlarged and not tender. Right adnexum displays no mass, no tenderness and no fullness. Left adnexum displays no mass, no tenderness and no fullness.  No pap performed  Lymphadenopathy:  She has no cervical adenopathy.  She has no axillary adenopathy.  Neurological: She is alert. She has normal strength. No cranial nerve deficit or sensory deficit.  Skin: Skin is warm, dry and intact. No rash noted.  Psychiatric: Her speech is normal and behavior is normal. Judgment normal. Her mood appears not anxious. Cognition and memory are normal. She does not exhibit a depressed mood.  Assessment & Plan:   Welcome to Medicare: The patient's preventative maintenance and recommended screening tests for an annual wellness exam were reviewed in full today.  Brought up to date unless services declined.  Counselled on the importance of diet, exercise, and its role in overall health and mortality.  The patient's FH and SH was reviewed, including their home life, tobacco status, and drug and alcohol status.   Mammo/breast exam: personal history of breast cancer: stable 03/2014 Vaccines: Uptodate with PNA, flu, shingles, TDap,  Not interested in prevnar today. DVE/pap:partial hysterectomy, one ovary remains. DVE yearly, no pap indicated.  Colon: 09/2013  nml repeat in 10 years Dr. Clydene Laming at  [facility]. DXA: 05/2012 Normal..due in 5 years. Former smoker minimal use.

## 2014-04-23 NOTE — Progress Notes (Signed)
Pre visit review using our clinic review tool, if applicable. No additional management support is needed unless otherwise documented below in the visit note. 

## 2014-04-23 NOTE — Assessment & Plan Note (Signed)
Followed by [facility]. ? Cdifficile partially treated. Has follow up to retest if not improving.

## 2014-04-23 NOTE — Assessment & Plan Note (Signed)
Well controlled. Continue current medication.  

## 2014-04-23 NOTE — Addendum Note (Signed)
Addended by: Eliezer Lofts E on: 04/23/2014 12:23 PM   Modules accepted: Level of Service, SmartSet

## 2014-04-23 NOTE — Patient Instructions (Signed)
Increase veggie fats along with fiber in diet. Continue probiotic.  Follow up with GI if diarrhea not improving.

## 2014-05-11 ENCOUNTER — Other Ambulatory Visit: Payer: Self-pay | Admitting: Family Medicine

## 2014-06-10 ENCOUNTER — Telehealth: Payer: Self-pay

## 2014-06-10 NOTE — Telephone Encounter (Signed)
Sydney Ortiz [phone]  Leomia dropped off a Medical [facility] form to be signed by Dr Diona Browner. Put in Dr Rometta Emery in box

## 2014-06-11 ENCOUNTER — Encounter: Payer: Self-pay | Admitting: Family Medicine

## 2014-09-18 ENCOUNTER — Encounter: Payer: Self-pay | Admitting: Family Medicine

## 2014-10-06 ENCOUNTER — Ambulatory Visit (INDEPENDENT_AMBULATORY_CARE_PROVIDER_SITE_OTHER): Payer: Medicare Other | Admitting: Internal Medicine

## 2014-10-06 ENCOUNTER — Encounter: Payer: Self-pay | Admitting: Internal Medicine

## 2014-10-06 VITALS — BP 108/70 | HR 56 | Temp 98.2°F | Wt 143.0 lb

## 2014-10-06 DIAGNOSIS — R21 Rash and other nonspecific skin eruption: Secondary | ICD-10-CM

## 2014-10-06 MED ORDER — FLUOCINONIDE 0.05 % EX CREA: 1.0000 "application " | TOPICAL_CREAM | Freq: Two times a day (BID) | CUTANEOUS | Status: DC

## 2014-10-06 NOTE — Progress Notes (Signed)
Subjective:    Patient ID: Sydney Ortiz, female    DOB: 27-Jul-1945, 69 y.o.   MRN: [id number]  HPI  Pt presents to the clinic today with c/o a rash on her chest. She noticed this 4 days ago. The rash is mostly on her right breast. The rash is very itchy. She denies any numbness or tingling in the area. She denies changes in soaps, lotions or detergents. She has tried Hydrocortisone cream without relief. She did start Budesonide 2 weeks ago for management of her colitis. She will be on it for a total of 8 weeks. She is not sure if this medication is related to the rash or not. Her main concern is that this rash is located on the breast that she had breast cancer in.  Review of Systems      Past Medical History  Diagnosis Date  . Paroxysmal SVT (supraventricular tachycardia)     hx; 5 beat run noted on holter monitor in 2005. EF 65%  . PKD (polycystic kidney disease)   . Migraine headache   . Osteoarthritis   . HLD (hyperlipidemia)     elevated triglycerides  . GERD (gastroesophageal reflux disease)     24 hr pH study did not show significant acid refulux  . Breast cancer     hx; s/p masectomy 1994  . Asthma   . AR (allergic rhinitis)   . Impaired fasting glucose   . Esophageal dysmotility     severe; has been elevated by rheumatology at [facility] but no connective tissue disease diagnosis was made. was ANA positive but anti-SCL 7- negative . 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    Current Outpatient Prescriptions  Medication Sig Dispense Refill  . diphenhydramine-acetaminophen (TYLENOL PM) 25-500 MG TABS Take 1 tablet by mouth at bedtime as needed.    . fenofibrate (TRICOR) 145 MG tablet TAKE 1 TABLET BY MOUTH DAILY 30 tablet 11  . fexofenadine (ALLEGRA) 60 MG tablet Take 60 mg by mouth daily.     . fluocinonide cream (LIDEX) 4.58 % Apply 1 application topically 2 (two) times daily.    . hyoscyamine (ANASPAZ) 0.125 MG TBDP Place 0.125 mg under the tongue as needed.        Marland Kitchen OVER THE COUNTER MEDICATION Alpha CRS+ 2 tablets by mouth daily    . OVER THE COUNTER MEDICATION Micro Plex MVP one tablet by mouth daily    . Probiotic Product (ALIGN) 4 MG CAPS Take 1 tablet by mouth daily.     No current facility-administered medications for this visit.    No Known Allergies  Family History  Problem Relation Age of Onset  . Diabetes      DM - MU and MA   . Arthritis Father   . Prostate cancer Father   . Hypertension Father   . Polycystic kidney disease Mother   . CVA Mother   . Dementia Mother   . Heart attack Brother   . Hypertension Brother   . Ulcers Brother   . Heart Problems Mother     valve disease    History   Social History  . Marital Status: Widowed    Spouse Name: N/A  . Number of Children: N/A  . Years of Education: N/A   Occupational History  . Not on file.   Social History Main Topics  . Smoking status: Former Research scientist (life sciences)  . Smokeless tobacco: Never Used  . Alcohol Use: Yes     Comment: moderate   .  Drug Use: No  . Sexual Activity: Not on file   Other Topics Concern  . Not on file   Social History Narrative   Married, 2 adopted children (1B, 1G)   Originally from [place]; moved from [place] to [place].    Regular exercise - treadmill, 2 miles 3x/week; 4 miles daily    Retired - Youth Dev   Diet: avoids MSG (triggers HA), cultured cheese, coloring.       Pt signed designated party release form and gives [place], spouse [phone] and, and son Denina Rieger, [phone], access to medical records. Can leave msg on answering machine [phone] and on cell [phone].      Has living will, [place] husband, full code ( reviewed 2014)     Constitutional: Denies fever, malaise, fatigue, headache or abrupt weight changes.  Respiratory: Denies difficulty breathing, shortness of breath, cough or sputum production.   Cardiovascular: Denies chest pain, chest tightness, palpitations or swelling in the hands or feet.  Skin: Pt reports rash on her  right breast. Denies ulcercations.    No other specific complaints in a complete review of systems (except as listed in HPI above).  Objective:   Physical Exam   BP 108/70 mmHg  Pulse 56  Temp(Src) 98.2 F (36.8 C) (Oral)  Wt 143 lb (64.864 kg)  SpO2 98% Wt Readings from Last 3 Encounters:  10/06/14 143 lb (64.864 kg)  04/23/14 140 lb 4 oz (63.617 kg)  01/06/14 151 lb (68.493 kg)    General: Appears her stated age, well developed, well nourished in NAD. Skin: Papular rash noted on right upper breast, appears allergic dermatitis Cardiovascular: Normal rate and rhythm. S1,S2 noted.  No murmur, rubs or gallops noted. Pulmonary/Chest: Normal effort and positive vesicular breath sounds. No respiratory distress. No wheezes, rales or ronchi noted.    BMET    Component Value Date/Time   NA 140 04/16/2014 0838   NA 139 09/24/2011 0949   NA 143 02/08/2011   K 3.9 04/16/2014 0838   K 3.7 09/24/2011 0949   CL 107 04/16/2014 0838   CL 107 09/24/2011 0949   CO2 28 04/16/2014 0838   CO2 25 09/24/2011 0949   GLUCOSE 82 04/16/2014 0838   GLUCOSE 156* 09/24/2011 0949   BUN 12 04/16/2014 0838   BUN 10 09/24/2011 0949   BUN 21 02/08/2011   CREATININE 0.92 04/16/2014 0838   CREATININE 0.94 09/24/2011 0949   CREATININE 0.9 02/08/2011   CALCIUM 9.6 04/16/2014 0838   CALCIUM 9.6 09/24/2011 0949   GFRNONAA >60 09/24/2011 0949   GFRNONAA >60 11/25/2009 0613   GFRAA >60 09/24/2011 0949   GFRAA  11/25/2009 0613    >60        The eGFR has been calculated using the MDRD equation. This calculation has not been validated in all clinical situations. eGFR's persistently <60 mL/min signify possible Chronic Kidney Disease.    Lipid Panel     Component Value Date/Time   CHOL 141 04/16/2014 0838   TRIG 87.0 04/16/2014 0838   HDL 45.80 04/16/2014 0838   CHOLHDL 3 04/16/2014 0838   VLDL 17.4 04/16/2014 0838   LDLCALC 78 04/16/2014 0838    CBC    Component Value Date/Time   WBC  5.3 03/15/2012 0853   WBC 4.6 09/24/2011 0949   RBC 4.26 03/15/2012 0853   RBC 4.12 09/24/2011 0949   HGB 13.0 03/15/2012 0853   HGB 12.6 09/24/2011 0949   HCT 39.4 03/15/2012 0853   HCT  37.9 09/24/2011 0949   PLT 265.0 03/15/2012 0853   PLT 209 09/24/2011 0949   MCV 92.4 03/15/2012 0853   MCV 92 09/24/2011 0949   MCH 30.7 09/24/2011 0949   MCH 30.6 11/22/2009 1155   MCHC 33.0 03/15/2012 0853   MCHC 33.4 09/24/2011 0949   RDW 13.6 03/15/2012 0853   RDW 13.9 09/24/2011 0949   LYMPHSABS 2.3 03/15/2012 0853   MONOABS 0.5 03/15/2012 0853   EOSABS 0.1 03/15/2012 0853   BASOSABS 0.0 03/15/2012 0853    Hgb A1C Lab Results  Component Value Date   HGBA1C 6.3* 05/30/2007        Assessment & Plan:   Allergic dermatitis:  eRx for Lidex cream to affected area BID until resolved Watch for increased redness, streaking or pain  RTC as needed or if symptoms persist or worsen

## 2014-10-06 NOTE — Progress Notes (Signed)
Pre visit review using our clinic review tool, if applicable. No additional management support is needed unless otherwise documented below in the visit note. 

## 2014-10-06 NOTE — Patient Instructions (Signed)

## 2015-01-01 ENCOUNTER — Other Ambulatory Visit: Payer: Self-pay

## 2015-01-01 ENCOUNTER — Encounter: Payer: Self-pay | Admitting: Family Medicine

## 2015-01-01 DIAGNOSIS — Z1231 Encounter for screening mammogram for malignant neoplasm of breast: Secondary | ICD-10-CM

## 2015-01-12 ENCOUNTER — Other Ambulatory Visit: Payer: Self-pay | Admitting: Nephrology

## 2015-01-12 DIAGNOSIS — Z87718 Personal history of other specified (corrected) congenital malformations of genitourinary system: Secondary | ICD-10-CM

## 2015-01-12 DIAGNOSIS — R1084 Generalized abdominal pain: Secondary | ICD-10-CM

## 2015-01-25 ENCOUNTER — Ambulatory Visit
Admission: RE | Admit: 2015-01-25 | Discharge: 2015-01-25 | Disposition: A | Discharge status: Home / Self Care | Payer: Medicare Other | Source: Ambulatory Visit | Attending: Nephrology | Admitting: Nephrology

## 2015-01-25 DIAGNOSIS — Z87718 Personal history of other specified (corrected) congenital malformations of genitourinary system: Secondary | ICD-10-CM

## 2015-01-25 DIAGNOSIS — R1084 Generalized abdominal pain: Secondary | ICD-10-CM

## 2015-04-06 ENCOUNTER — Ambulatory Visit
Admission: RE | Admit: 2015-04-06 | Discharge: 2015-04-06 | Disposition: A | Discharge status: Home / Self Care | Payer: Medicare Other | Source: Ambulatory Visit

## 2015-04-06 DIAGNOSIS — Z1231 Encounter for screening mammogram for malignant neoplasm of breast: Secondary | ICD-10-CM

## 2015-05-25 ENCOUNTER — Other Ambulatory Visit (INDEPENDENT_AMBULATORY_CARE_PROVIDER_SITE_OTHER): Payer: Medicare Other

## 2015-05-25 ENCOUNTER — Telehealth: Payer: Self-pay | Admitting: Family Medicine

## 2015-05-25 DIAGNOSIS — E785 Hyperlipidemia, unspecified: Secondary | ICD-10-CM | POA: Diagnosis not present

## 2015-05-25 DIAGNOSIS — D731 Hypersplenism: Secondary | ICD-10-CM

## 2015-05-25 LAB — CBC WITH DIFFERENTIAL/PLATELET
BASOS ABS: 0 10*3/uL (ref 0.0–0.1)
Basophils Relative: 0.9 % (ref 0.0–3.0)
EOS ABS: 0.1 10*3/uL (ref 0.0–0.7)
Eosinophils Relative: 1.2 % (ref 0.0–5.0)
HCT: 38.7 % (ref 36.0–46.0)
HEMOGLOBIN: 12.9 g/dL (ref 12.0–15.0)
LYMPHS PCT: 51.7 % — AB (ref 12.0–46.0)
Lymphs Abs: 2.5 10*3/uL (ref 0.7–4.0)
MCHC: 33.4 g/dL (ref 30.0–36.0)
MCV: 92.3 fl (ref 78.0–100.0)
MONO ABS: 0.5 10*3/uL (ref 0.1–1.0)
Monocytes Relative: 9.5 % (ref 3.0–12.0)
Neutro Abs: 1.8 10*3/uL (ref 1.4–7.7)
Neutrophils Relative %: 36.7 % — ABNORMAL LOW (ref 43.0–77.0)
Platelets: 252 10*3/uL (ref 150.0–400.0)
RBC: 4.2 Mil/uL (ref 3.87–5.11)
RDW: 15.3 % (ref 11.5–15.5)
WBC: 4.8 10*3/uL (ref 4.0–10.5)

## 2015-05-25 LAB — LIPID PANEL
CHOL/HDL RATIO: 2
Cholesterol: 192 mg/dL (ref 0–200)
HDL: 77.6 mg/dL (ref >39.00)
LDL CALC: 98 mg/dL (ref 0–99)
NONHDL: 114.46
Triglycerides: 83 mg/dL (ref 0.0–149.0)
VLDL: 16.6 mg/dL (ref 0.0–40.0)

## 2015-05-25 LAB — COMPREHENSIVE METABOLIC PANEL
ALT: 13 U/L (ref 0–35)
AST: 18 U/L (ref 0–37)
Albumin: 4.2 g/dL (ref 3.5–5.2)
Alkaline Phosphatase: 49 U/L (ref 39–117)
BILIRUBIN TOTAL: 0.4 mg/dL (ref 0.2–1.2)
BUN: 13 mg/dL (ref 6–23)
CO2: 28 meq/L (ref 19–32)
CREATININE: 0.83 mg/dL (ref 0.40–1.20)
Calcium: 9.6 mg/dL (ref 8.4–10.5)
Chloride: 108 mEq/L (ref 96–112)
GFR: 72.23 mL/min (ref >60.00)
GLUCOSE: 91 mg/dL (ref 70–99)
Potassium: 3.8 mEq/L (ref 3.5–5.1)
Sodium: 142 mEq/L (ref 135–145)
TOTAL PROTEIN: 6.6 g/dL (ref 6.0–8.3)

## 2015-05-25 NOTE — Telephone Encounter (Signed)
-----   Message from Marchia Bond sent at 05/17/2015  1:51 PM EDT ----- Regarding: Cpx labs Tues 3/21, need orders. Thanks! :-) Please order  future cpx labs for pt's upcoming lab appt. Thanks Aniceto Boss

## 2015-05-26 ENCOUNTER — Other Ambulatory Visit: Payer: Self-pay | Admitting: Family Medicine

## 2015-05-28 ENCOUNTER — Encounter: Payer: Self-pay | Admitting: Family Medicine

## 2015-05-28 ENCOUNTER — Ambulatory Visit (INDEPENDENT_AMBULATORY_CARE_PROVIDER_SITE_OTHER): Payer: Medicare Other | Admitting: Family Medicine

## 2015-05-28 VITALS — BP 126/68 | HR 52 | Temp 97.5°F | Ht 62.75 in | Wt 134.8 lb

## 2015-05-28 DIAGNOSIS — E785 Hyperlipidemia, unspecified: Secondary | ICD-10-CM

## 2015-05-28 DIAGNOSIS — Z0000 Encounter for general adult medical examination without abnormal findings: Secondary | ICD-10-CM

## 2015-05-28 DIAGNOSIS — Z7189 Other specified counseling: Secondary | ICD-10-CM

## 2015-05-28 NOTE — Progress Notes (Signed)
Pre visit review using our clinic review tool, if applicable. No additional management support is needed unless otherwise documented below in the visit note. 

## 2015-05-28 NOTE — Progress Notes (Signed)
HPI  I have personally reviewed the Medicare Annual Wellness questionnaire and have noted 1. The patient's medical and social history 2. Their use of alcohol, tobacco or illicit drugs 3. Their current medications and supplements 4. The patient's functional ability including ADL's, fall risks, home safety risks and hearing or visual             impairment. 5. Diet and physical activities 6. Evidence for depression or mood disorders 7.         Updated provider list Cognitive evaluation was performed and recorded on pt medicare questionnaire form. The patients weight, height, BMI and visual acuity have been recorded in the chart  I have made referrals, counseling and provided education to the patient based review of the above and I have provided the pt with a written personalized care plan for preventive services.    [place]  She was doing well until she fell in [place] 2/18, broke wrist left. Followed now by [facility].  She is starting PT at [facility].  PKD: followed by nephrology. Cr stable.  Paroxsysmal PSVT: Several episodes repeat at rest in 02/2012. Resolved on its own.  Diagnosed with microscopic colitis.. On budesonide. Followed by [facility]. Abdominal spasm, diarrhea: Followed by [facility] (Dr. Clydene Laming). On hyoscamine.  Using gluten and dairy free diet. Using probiotic.  GERD, using zantac at night  Elevated LFTS/fatty liver: Fatty liver seen on [place] 2/12  LFTS nl on labs today.  Prediabetes: resolved   Elevated Cholesterol: LDl at goal < 130. Trigs at goal on tricor. No further muscle ache/ cramps. Lab Results  Component Value Date   CHOL 192 05/25/2015   HDL 77.60 05/25/2015   LDLCALC 98 05/25/2015   LDLDIRECT 130.8 11/15/2009   TRIG 83.0 05/25/2015   CHOLHDL 2 05/25/2015   Wt Readings from Last 3 Encounters:  05/28/15 134 lb 12 oz (61.122 kg)  10/06/14 143 lb (64.864 kg)  04/23/14 140 lb 4 oz (63.617 kg)   Review of Systems   Constitutional: Negative for fever, fatigue and unexpected weight change.  HENT: Negative for ear pain, congestion, sore throat, sneezing, trouble swallowing and sinus pressure.  Eyes: Negative for pain and itching.  Respiratory: Negative for cough, shortness of breath and wheezing.  Cardiovascular: Negative for chest pain, palpitations and leg swelling.  Gastrointestinal: Negative for nausea, abdominal pain, diarrhea, constipation and blood in stool.  Genitourinary: Negative for dysuria, hematuria, vaginal discharge, difficulty urinating and menstrual problem.  Skin: Negative for rash.  Neurological: Negative for syncope, weakness, light-headedness, numbness and headaches.  Psychiatric/Behavioral: Negative for confusion and dysphoric mood. The patient is not nervous/anxious. Some increase in stress, sleeping better than before on tyelnol pm Objective:   Physical Exam  Constitutional: Vital signs are normal. She appears well-developed and well-nourished. She is cooperative. Non-toxic appearance. She does not appear ill. No distress.  HENT:  Head: Normocephalic.  Right Ear: Hearing, tympanic membrane, external ear and ear canal normal.  Left Ear: Hearing, tympanic membrane, external ear and ear canal normal.  Nose: Nose normal.  Eyes: Conjunctivae, EOM and lids are normal. Pupils are equal, round, and reactive to light. No foreign bodies found.  Neck: Trachea normal and normal range of motion. Neck supple. Carotid bruit is not present. No mass and no thyromegaly present.  Cardiovascular: Normal rate, regular rhythm, S1 normal, S2 normal, normal heart sounds and intact distal pulses. Exam reveals no gallop.  No murmur heard.  Pulmonary/Chest: Effort normal and breath sounds  normal. No respiratory distress. She has no wheezes. She has no rhonchi. She has no rales.  Abdominal: Soft. Normal appearance and bowel sounds are normal. She exhibits no distension, no fluid wave, no  abdominal bruit and no mass. There is no hepatosplenomegaly. There is no tenderness. There is no rebound, no guarding and no CVA tenderness. No hernia.  Genitourinary: Vagina normal and uterus normal. No breast swelling, tenderness, discharge or bleeding. Pelvic exam was performed with patient prone. There is no rash, tenderness or lesion on the right labia. There is no rash, tenderness or lesion on the left labia. Uterus is not enlarged and not tender. Right adnexum displays no mass, no tenderness and no fullness. Left adnexum displays no mass, no tenderness and no fullness.  No pap performed  Lymphadenopathy:  She has no cervical adenopathy.  She has no axillary adenopathy.  Neurological: She is alert. She has normal strength. No cranial nerve deficit or sensory deficit.  Skin: Skin is warm, dry and intact. No rash noted.  Psychiatric: Her speech is normal and behavior is normal. Judgment normal. Her mood appears not anxious. Cognition and memory are normal. She does not exhibit a depressed mood.  Assessment & Plan:   Welcome to Medicare: The patient's preventative maintenance and recommended screening tests for an annual wellness exam were reviewed in full today.  Brought up to date unless services declined.  Counselled on the importance of diet, exercise, and its role in overall health and mortality.  The patient's FH and SH was reviewed, including their home life, tobacco status, and drug and alcohol status.   Mammo/breast exam: personal history of breast cancer: stable 03/2015 Vaccines: Uptodate with PNA, flu, shingles, TDap, Due for prevnar. DVE/pap:partial hysterectomy, one ovary remains. DVE yearly, no pap indicated.  Colon: 09/2014 bx microscopic colitis, repeat in 10 years Dr. Clydene Laming at [facility]. DXA: 05/2012 Normal..due in 5 years. Former smoker minimal use.

## 2015-05-28 NOTE — Patient Instructions (Addendum)
Get prevnar at pharmacy or call to set up here when doing better.  Ge back to regular exercise as able.

## 2015-05-28 NOTE — Assessment & Plan Note (Signed)
Well controlled but slightly worsened with no clear cause. Continue current medication. Encouraged exercise, weight loss, healthy eating habits.

## 2015-05-28 NOTE — Assessment & Plan Note (Signed)
Living will, HCPOA, full code ( reviewed 2017)

## 2015-07-21 ENCOUNTER — Other Ambulatory Visit: Payer: Self-pay | Admitting: Orthopedic Surgery

## 2015-07-21 DIAGNOSIS — S52562D Barton's fracture of left radius, subsequent encounter for closed fracture with routine healing: Secondary | ICD-10-CM

## 2015-07-27 ENCOUNTER — Ambulatory Visit
Admission: RE | Admit: 2015-07-27 | Discharge: 2015-07-27 | Disposition: A | Discharge status: Home / Self Care | Payer: Medicare Other | Source: Ambulatory Visit | Attending: Orthopedic Surgery | Admitting: Orthopedic Surgery

## 2015-07-27 DIAGNOSIS — S52562D Barton's fracture of left radius, subsequent encounter for closed fracture with routine healing: Secondary | ICD-10-CM

## 2015-10-13 ENCOUNTER — Other Ambulatory Visit: Payer: Self-pay | Admitting: Nephrology

## 2015-10-14 ENCOUNTER — Other Ambulatory Visit: Payer: Self-pay | Admitting: Nephrology

## 2015-10-14 DIAGNOSIS — I158 Other secondary hypertension: Secondary | ICD-10-CM

## 2015-10-14 DIAGNOSIS — N181 Chronic kidney disease, stage 1: Secondary | ICD-10-CM

## 2015-10-14 DIAGNOSIS — K760 Fatty (change of) liver, not elsewhere classified: Secondary | ICD-10-CM

## 2015-10-28 ENCOUNTER — Ambulatory Visit
Admission: RE | Admit: 2015-10-28 | Discharge: 2015-10-28 | Disposition: A | Discharge status: Home / Self Care | Payer: Medicare Other | Source: Ambulatory Visit | Attending: Nephrology | Admitting: Nephrology

## 2015-10-28 ENCOUNTER — Other Ambulatory Visit: Payer: Medicare Other

## 2015-10-28 DIAGNOSIS — K760 Fatty (change of) liver, not elsewhere classified: Secondary | ICD-10-CM

## 2015-10-28 DIAGNOSIS — I158 Other secondary hypertension: Secondary | ICD-10-CM

## 2015-10-28 DIAGNOSIS — N181 Chronic kidney disease, stage 1: Secondary | ICD-10-CM

## 2016-02-11 ENCOUNTER — Encounter: Payer: Self-pay | Admitting: *Deleted

## 2016-03-01 ENCOUNTER — Other Ambulatory Visit: Payer: Self-pay | Admitting: Family Medicine

## 2016-03-01 DIAGNOSIS — Z9011 Acquired absence of right breast and nipple: Secondary | ICD-10-CM

## 2016-03-01 DIAGNOSIS — Z1231 Encounter for screening mammogram for malignant neoplasm of breast: Secondary | ICD-10-CM

## 2016-04-06 ENCOUNTER — Ambulatory Visit
Admission: RE | Admit: 2016-04-06 | Discharge: 2016-04-06 | Disposition: A | Discharge status: Home / Self Care | Payer: Medicare Other | Source: Ambulatory Visit | Attending: Family Medicine | Admitting: Family Medicine

## 2016-04-06 DIAGNOSIS — Z9011 Acquired absence of right breast and nipple: Secondary | ICD-10-CM

## 2016-04-06 DIAGNOSIS — Z1231 Encounter for screening mammogram for malignant neoplasm of breast: Secondary | ICD-10-CM

## 2016-05-16 ENCOUNTER — Encounter: Payer: Self-pay | Admitting: Family Medicine

## 2016-05-16 ENCOUNTER — Ambulatory Visit (INDEPENDENT_AMBULATORY_CARE_PROVIDER_SITE_OTHER): Payer: Medicare Other | Admitting: Family Medicine

## 2016-05-16 DIAGNOSIS — J301 Allergic rhinitis due to pollen: Secondary | ICD-10-CM

## 2016-05-16 DIAGNOSIS — J0100 Acute maxillary sinusitis, unspecified: Secondary | ICD-10-CM | POA: Diagnosis not present

## 2016-05-16 DIAGNOSIS — J019 Acute sinusitis, unspecified: Secondary | ICD-10-CM | POA: Insufficient documentation

## 2016-05-16 MED ORDER — AZITHROMYCIN 250 MG PO TABS: ORAL_TABLET | ORAL | 0 refills | Status: DC

## 2016-05-16 NOTE — Progress Notes (Signed)
Pre visit review using our clinic review tool, if applicable. No additional management support is needed unless otherwise documented below in the visit note. 

## 2016-05-16 NOTE — Patient Instructions (Signed)
Add nasal flonase 2 sprays per nostril daily to allegra. Start nasal saline irrigation or spray. Complete course of antibiotics.  Call if not improving as expected.

## 2016-05-16 NOTE — Progress Notes (Signed)
   Subjective:    Patient ID: Sydney Ortiz, female    DOB: [birth year], 72 y.o.   MRN: [id number]  Sinusitis  This is a new problem. The current episode started 1 to 4 weeks ago ( 2 weeks). The problem has been waxing and waning since onset. There has been no fever. Associated symptoms include coughing, headaches, sinus pressure and sneezing. Pertinent negatives include no ear pain, shortness of breath, sore throat or swollen glands. (No SOB. Left ear fullness. Itchy eyes.   Fatigue) Treatments tried: tyelnol sinus strength with decongestant, allegra.    next week she has surgery for removing pins from left wrist.  No recent antibiotics. No sick contacts.  Social History /Family History/Past Medical History reviewed and updated if needed.  Review of Systems  HENT: Positive for sinus pressure and sneezing. Negative for ear pain and sore throat.   Respiratory: Positive for cough. Negative for shortness of breath.   Neurological: Positive for headaches.       Objective:   Physical Exam  Constitutional: Vital signs are normal. She appears well-developed and well-nourished. She is cooperative.  Non-toxic appearance. She does not appear ill. No distress.  HENT:  Head: Normocephalic.  Right Ear: Hearing, tympanic membrane, external ear and ear canal normal. Tympanic membrane is not erythematous, not retracted and not bulging.  Left Ear: Hearing, tympanic membrane, external ear and ear canal normal. Tympanic membrane is not erythematous, not retracted and not bulging.  Nose: Mucosal edema and rhinorrhea present. Right sinus exhibits no maxillary sinus tenderness and no frontal sinus tenderness. Left sinus exhibits no maxillary sinus tenderness and no frontal sinus tenderness.  Mouth/Throat: Uvula is midline, oropharynx is clear and moist and mucous membranes are normal.  Left TM obscured initially with cerumen... Flushed without complications.  Eyes: Conjunctivae, EOM and lids are normal.  Pupils are equal, round, and reactive to light. Lids are everted and swept, no foreign bodies found.  Neck: Trachea normal and normal range of motion. Neck supple. Carotid bruit is not present. No thyroid mass and no thyromegaly present.  Cardiovascular: Normal rate, regular rhythm, S1 normal, S2 normal, normal heart sounds, intact distal pulses and normal pulses.  Exam reveals no gallop and no friction rub.   No murmur heard. Pulmonary/Chest: Effort normal and breath sounds normal. No tachypnea. No respiratory distress. She has no decreased breath sounds. She has no wheezes. She has no rhonchi. She has no rales.  Neurological: She is alert.  Skin: Skin is warm, dry and intact. No rash noted.  Psychiatric: Her speech is normal and behavior is normal. Judgment normal. Her mood appears not anxious. Cognition and memory are normal. She does not exhibit a depressed mood.          Assessment & Plan:

## 2016-05-16 NOTE — Assessment & Plan Note (Signed)
Add flonase to allegra and decongestant . Also also nasal saline irrigation.

## 2016-05-16 NOTE — Assessment & Plan Note (Signed)
>   2 week of symptoms and upcoming procedure.  Cover with antibiotics.

## 2016-05-22 ENCOUNTER — Telehealth: Payer: Self-pay | Admitting: Family Medicine

## 2016-05-22 DIAGNOSIS — E782 Mixed hyperlipidemia: Secondary | ICD-10-CM

## 2016-05-22 NOTE — Telephone Encounter (Signed)
-----   Message from Eustace Pen, LPN sent at [phone]/66/4403  8:55 PM EDT ----- Regarding: Labs 3/20 Please place lab orders.

## 2016-05-23 ENCOUNTER — Ambulatory Visit (INDEPENDENT_AMBULATORY_CARE_PROVIDER_SITE_OTHER): Payer: Medicare Other

## 2016-05-23 VITALS — BP 110/74 | HR 82 | Temp 99.0°F | Ht 62.5 in | Wt 148.0 lb

## 2016-05-23 DIAGNOSIS — Z0000 Encounter for general adult medical examination without abnormal findings: Secondary | ICD-10-CM

## 2016-05-23 DIAGNOSIS — E782 Mixed hyperlipidemia: Secondary | ICD-10-CM | POA: Diagnosis not present

## 2016-05-23 LAB — COMPREHENSIVE METABOLIC PANEL
ALBUMIN: 4.6 g/dL (ref 3.5–5.2)
ALT: 22 U/L (ref 0–35)
AST: 25 U/L (ref 0–37)
Alkaline Phosphatase: 46 U/L (ref 39–117)
BILIRUBIN TOTAL: 0.5 mg/dL (ref 0.2–1.2)
BUN: 12 mg/dL (ref 6–23)
CALCIUM: 10.6 mg/dL — AB (ref 8.4–10.5)
CO2: 30 mEq/L (ref 19–32)
CREATININE: 0.86 mg/dL (ref 0.40–1.20)
Chloride: 102 mEq/L (ref 96–112)
GFR: 69.13 mL/min (ref >60.00)
Glucose, Bld: 89 mg/dL (ref 70–99)
Potassium: 4.2 mEq/L (ref 3.5–5.1)
Sodium: 141 mEq/L (ref 135–145)
TOTAL PROTEIN: 7 g/dL (ref 6.0–8.3)

## 2016-05-23 LAB — LIPID PANEL
CHOLESTEROL: 192 mg/dL (ref 0–200)
HDL: 71.7 mg/dL (ref >39.00)
LDL Cholesterol: 101 mg/dL — ABNORMAL HIGH (ref 0–99)
NonHDL: 120.79
TRIGLYCERIDES: 101 mg/dL (ref 0.0–149.0)
Total CHOL/HDL Ratio: 3
VLDL: 20.2 mg/dL (ref 0.0–40.0)

## 2016-05-23 NOTE — Patient Instructions (Signed)
Ms. Decaire , Thank you for taking time to come for your Medicare Wellness Visit. I appreciate your ongoing commitment to your health goals. Please review the following plan we discussed and let me know if I can assist you in the future.   These are the goals we discussed: Goals    . Increase physical activity          Starting 05/23/16, I will continue to exercise at least 60 min 3 days per week.        This is a list of the screening recommended for you and due dates:  Health Maintenance  Topic Date Due  . Mammogram  04/06/2017  . Tetanus Vaccine  08/05/2022  . Colon Cancer Screening  09/10/2024  . Flu Shot  Completed  . DEXA scan (bone density measurement)  Completed  .  Hepatitis C: One time screening is recommended by Center for Disease Control  (CDC) for  adults born from [birth year] through 1965.   Completed  . Pneumonia vaccines  Completed   Preventive Care for Adults  A healthy lifestyle and preventive care can promote health and wellness. Preventive health guidelines for adults include the following key practices.  . A routine yearly physical is a good way to check with your health care provider about your health and preventive screening. It is a chance to share any concerns and updates on your health and to receive a thorough exam.  . Visit your dentist for a routine exam and preventive care every 6 months. Brush your teeth twice a day and floss once a day. Good oral hygiene prevents tooth decay and gum disease.  . The frequency of eye exams is based on your age, health, family medical history, use  of contact lenses, and other factors. Follow your health care provider's ecommendations for frequency of eye exams.  . Eat a healthy diet. Foods like vegetables, fruits, whole grains, low-fat dairy products, and lean protein foods contain the nutrients you need without too many calories. Decrease your intake of foods high in solid fats, added sugars, and salt. Eat the right amount of  calories for you. Get information about a proper diet from your health care provider, if necessary.  . Regular physical exercise is one of the most important things you can do for your health. Most adults should get at least 150 minutes of moderate-intensity exercise (any activity that increases your heart rate and causes you to sweat) each week. In addition, most adults need muscle-strengthening exercises on 2 or more days a week.  Silver Sneakers may be a benefit available to you. To determine eligibility, you may visit the website: www.silversneakers.com or contact program at [phone] Mon-Fri between 8AM-8PM.   . Maintain a healthy weight. The body mass index (BMI) is a screening tool to identify possible weight problems. It provides an estimate of body fat based on height and weight. Your health care provider can find your BMI and can help you achieve or maintain a healthy weight.   For adults 20 years and older: ? A BMI below 18.5 is considered underweight. ? A BMI of 18.5 to 24.9 is normal. ? A BMI of 25 to 29.9 is considered overweight. ? A BMI of 30 and above is considered obese.   . Maintain normal blood lipids and cholesterol levels by exercising and minimizing your intake of saturated fat. Eat a balanced diet with plenty of fruit and vegetables. Blood tests for lipids and cholesterol should begin at age 50  and be repeated every 5 years. If your lipid or cholesterol levels are high, you are over 50, or you are at high risk for heart disease, you may need your cholesterol levels checked more frequently. Ongoing high lipid and cholesterol levels should be treated with medicines if diet and exercise are not working.  . If you smoke, find out from your health care provider how to quit. If you do not use tobacco, please do not start.  . If you choose to drink alcohol, please do not consume more than 2 drinks per day. One drink is considered to be 12 ounces (355 mL) of beer, 5 ounces  (148 mL) of wine, or 1.5 ounces (44 mL) of liquor.  . If you are 82-78 years old, ask your health care provider if you should take aspirin to prevent strokes.  . Use sunscreen. Apply sunscreen liberally and repeatedly throughout the day. You should seek shade when your shadow is shorter than you. Protect yourself by wearing long sleeves, pants, a wide-brimmed hat, and sunglasses year round, whenever you are outdoors.  . Once a month, do a whole body skin exam, using a mirror to look at the skin on your back. Tell your health care provider of new moles, moles that have irregular borders, moles that are larger than a pencil eraser, or moles that have changed in shape or color.

## 2016-05-23 NOTE — Progress Notes (Signed)
Pre visit review using our clinic review tool, if applicable. No additional management support is needed unless otherwise documented below in the visit note. 

## 2016-05-23 NOTE — Progress Notes (Signed)
I reviewed health advisor's note, was available for consultation, and agree with documentation and plan.  

## 2016-05-23 NOTE — Progress Notes (Signed)
Subjective:   Sydney Ortiz is a 71 y.o. female who presents for Medicare Annual (Subsequent) preventive examination.  Review of Systems:  N/A Cardiac Risk Factors include: advanced age (>76men, >42 women);dyslipidemia     Objective:     Vitals: BP 110/74 (BP Location: Left Arm, Patient Position: Sitting, Cuff Size: Normal)   Pulse 82   Temp 99 F (37.2 C) (Oral)   Ht 5' 2.5" (1.588 m) Comment: no shoes  Wt 148 lb (67.1 kg)   SpO2 96%   BMI 26.64 kg/m   Body mass index is 26.64 kg/m.   Tobacco History  Smoking Status  . Former Smoker  Smokeless Tobacco  . Never Used     Counseling given: No   Past Medical History:  Diagnosis Date  . AR (allergic rhinitis)   . Asthma   . Breast cancer ([facility])    hx; s/p masectomy 1994  . Esophageal dysmotility    severe; has been elevated by rheumatology at [facility] but no connective tissue disease diagnosis was made. was ANA positive but anti-SCL 7- negative . 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  Marland Kitchen GERD (gastroesophageal reflux disease)    24 hr pH study did not show significant acid refulux  . HLD (hyperlipidemia)    elevated triglycerides  . Impaired fasting glucose   . Migraine headache   . Osteoarthritis   . Paroxysmal SVT (supraventricular tachycardia) ([facility])    hx; 5 beat run noted on holter monitor in 2005. EF 65%  . PKD (polycystic kidney disease)    Past Surgical History:  Procedure Laterality Date  . abt CT test  6/07   bilateral cysts, larget on R 9.6 x 6.7 cm  . BREAST RECONSTRUCTION  9/11  . colonoscopy/EGD  2005  . holter monitor  1/05   PVC, PACs, SVT  . kidney surgery - cyst removal  3/06  . PARTIAL HYSTERECTOMY  1983   due to fibroids  . radial masectomy  1993   R side  . right carpal tunnel  7/11  . rotator cuff, right  2004   Family History  Problem Relation Age of Onset  . Arthritis Father   . Prostate cancer Father   . Hypertension Father   . Polycystic kidney disease Mother   .  CVA Mother   . Dementia Mother   . Heart Problems Mother     valve disease  . Diabetes      DM - MU and MA   . Heart attack Brother   . Hypertension Brother   . Ulcers Brother    History  Sexual Activity  . Sexual activity: Yes    Outpatient Encounter Prescriptions as of 05/23/2016  Medication Sig  . diphenhydramine-acetaminophen (TYLENOL PM) 25-500 MG TABS Take 1 tablet by mouth at bedtime as needed.  . fenofibrate (TRICOR) 145 MG tablet TAKE 1 TABLET BY MOUTH DAILY  . fexofenadine (ALLEGRA) 60 MG tablet Take 60 mg by mouth daily.   . hyoscyamine (ANASPAZ) 0.125 MG TBDP Place 0.125 mg under the tongue as needed.    Marland Kitchen OVER THE COUNTER MEDICATION Alpha CRS+ 2 tablets by mouth daily  . OVER THE COUNTER MEDICATION Micro Plex MVP one tablet by mouth daily  . Probiotic Product (PROBIOTIC-10 PO) Take by mouth.  . [DISCONTINUED] azithromycin (ZITHROMAX) 250 MG tablet 2 tab po x 1 day then 1 tab po daily   No facility-administered encounter medications on file as of 05/23/2016.     Activities of Daily Living  In your present state of health, do you have any difficulty performing the following activities: 05/23/2016  Hearing? N  Vision? N  Difficulty concentrating or making decisions? N  Walking or climbing stairs? N  Dressing or bathing? N  Doing errands, shopping? N  Preparing Food and eating ? N  Using the Toilet? N  In the past six months, have you accidently leaked urine? N  Do you have problems with loss of bowel control? N  Managing your Medications? N  Managing your Finances? N  Housekeeping or managing your Housekeeping? N  Some recent data might be hidden    Patient Care Team: Jinny Sanders, MD as PCP - General Thelma Comp, OD as Consulting Physician (Optometry) Abelardo Diesel Sherri Sear., MD as Referring Physician (Gastroenterology) Donato Heinz, MD as Consulting Physician (Nephrology)    Assessment:     Hearing Screening   125Hz  250Hz  500Hz  1000Hz  2000Hz   3000Hz  4000Hz  6000Hz  8000Hz   Right ear:   0 40 40  0    Left ear:   0 0 0  0    Vision Screening Comments: Last vision exam in Aug 2017 with Dr. [name]   Exercise Activities and Dietary recommendations Current Exercise Habits: Structured exercise class;Home exercise routine, Type of exercise: Other - see comments (water aerobics), Time (Minutes): 60, Frequency (Times/Week): 3, Weekly Exercise (Minutes/Week): 180, Intensity: Moderate, Exercise limited by: None identified  Goals    . Increase physical activity          Starting 05/23/16, I will continue to exercise at least 60 min 3 days per week.       Fall Risk Fall Risk  05/23/2016 05/28/2015 04/23/2014 03/25/2013 03/22/2012  Falls in the past year? No Yes No No No  Number falls in past yr: - 1 - - -  Injury with Fall? - Yes - - -   Depression Screen PHQ 2/9 Scores 05/23/2016 05/28/2015 04/23/2014 03/25/2013  PHQ - 2 Score 0 0 0 0     Cognitive Function MMSE - Mini Mental State Exam 05/23/2016  Orientation to time 5  Orientation to Place 5  Registration 3  Attention/ Calculation 0  Recall 2 pt was unable to recall 1 of 3 words  Language- name 2 objects 0  Language- repeat 1  Language- follow 3 step command 3  Language- read & follow direction 0  Write a sentence 0  Copy design 0  Total score 19     PLEASE NOTE: A Mini-Cog screen was completed. Maximum score is 20. A value of 0 denotes this part of Folstein MMSE was not completed or the patient failed this part of the Mini-Cog screening.   Mini-Cog Screening Orientation to Time - Max 5 pts Orientation to Place - Max 5 pts Registration - Max 3 pts Recall - Max 3 pts Language Repeat - Max 1 pts Language Follow 3 Step Command - Max 3 pts     Immunization History  Administered Date(s) Administered  . Influenza Split 11/21/2010, 12/13/2011  . Influenza Whole 01/04/2007, 12/02/2007, 12/02/2008, 12/29/2009  . Influenza, High Dose Seasonal PF 12/14/2015  . Influenza,inj,Quad  PF,36+ Mos 12/11/2012, 01/21/2014  . Influenza-Unspecified 01/01/2015  . Pneumococcal Conjugate-13 02/09/2016  . Pneumococcal Polysaccharide-23 06/04/2005, 12/11/2012  . Td 01/05/2004  . Tdap 08/04/2012  . Zoster 07/16/2009   Screening Tests Health Maintenance  Topic Date Due  . MAMMOGRAM  04/06/2017  . TETANUS/TDAP  08/05/2022  . COLONOSCOPY  09/10/2024  . INFLUENZA VACCINE  Completed  .  DEXA SCAN  Completed  . Hepatitis C Screening  Completed  . PNA vac Low Risk Adult  Completed      Plan:     I have personally reviewed and addressed the Medicare Annual Wellness questionnaire and have noted the following in the patient's chart:  A. Medical and social history B. Use of alcohol, tobacco or illicit drugs  C. Current medications and supplements D. Functional ability and status E.  Nutritional status F.  Physical activity G. Advance directives H. List of other physicians I.  Hospitalizations, surgeries, and ER visits in previous 12 months J.  [place] to include hearing, vision, cognitive, depression L. Referrals and appointments - none  In addition, I have reviewed and discussed with patient certain preventive protocols, quality metrics, and best practice recommendations. A written personalized care plan for preventive services as well as general preventive health recommendations were provided to patient.  See attached scanned questionnaire for additional information.   Signed,   Lindell Noe, MHA, BS, LPN Health Coach

## 2016-05-23 NOTE — Progress Notes (Signed)
PCP notes:   Health maintenance:  No gaps identified.  Abnormal screenings:   Hearing - failed Mini-Cog score: 19/20  Patient concerns:   None  Nurse concerns:  None  Next PCP appt:   05/30/16 @ 1400

## 2016-05-30 ENCOUNTER — Encounter: Payer: Medicare Other | Admitting: Family Medicine

## 2016-06-01 ENCOUNTER — Ambulatory Visit (INDEPENDENT_AMBULATORY_CARE_PROVIDER_SITE_OTHER): Payer: Medicare Other | Admitting: Family Medicine

## 2016-06-01 ENCOUNTER — Encounter: Payer: Self-pay | Admitting: Family Medicine

## 2016-06-01 VITALS — BP 102/68 | HR 66 | Temp 98.1°F | Ht 62.75 in | Wt 146.0 lb

## 2016-06-01 DIAGNOSIS — J0100 Acute maxillary sinusitis, unspecified: Secondary | ICD-10-CM | POA: Diagnosis not present

## 2016-06-01 MED ORDER — AMOXICILLIN-POT CLAVULANATE 875-125 MG PO TABS: 1.0000 | ORAL_TABLET | Freq: Two times a day (BID) | ORAL | 0 refills | Status: DC

## 2016-06-01 MED ORDER — PREDNISONE 10 MG PO TABS: 10.0000 mg | ORAL_TABLET | Freq: Every day | ORAL | 0 refills | Status: DC

## 2016-06-01 NOTE — Assessment & Plan Note (Addendum)
Incompletely treated bacterial infection. Cover with broader antibitoics. Treat with pred for swelling and inflammation facially.

## 2016-06-01 NOTE — Progress Notes (Signed)
   Subjective:    Patient ID: Sydney Ortiz, female    DOB: 11-12-1945, 71 y.o.   MRN: [id number]  HPI    71 year old female presents for sinus pressure and headache.   On 3/13 treated with flonase and  Azithromycin x 5 days.  Had surgery on left wrist last week.  She has continued to have congestion and cough. Face pain and pressure worsening.  Headache over frontal sinus. Nausea from the headache.  No ear pain but feels full.  She has green nasal discharge. No SOB, has fatgiue.  Hx of migraine.. This feels different.   Vitals:   06/01/16 1207  BP: 102/68  Pulse: 66  Temp: 98.1 F (36.7 C)    Review of Systems  Constitutional: Negative for fatigue and fever.  HENT: Negative for ear pain.   Eyes: Negative for pain.  Respiratory: Negative for chest tightness and shortness of breath.   Cardiovascular: Negative for chest pain, palpitations and leg swelling.  Gastrointestinal: Negative for abdominal pain.  Genitourinary: Negative for dysuria.       Objective:   Physical Exam  Constitutional: Vital signs are normal. She appears well-developed and well-nourished. She is cooperative.  Non-toxic appearance. She does not appear ill. No distress.  HENT:  Head: Normocephalic.  Right Ear: Hearing, external ear and ear canal normal. Tympanic membrane is not erythematous, not retracted and not bulging. A middle ear effusion is present.  Left Ear: Hearing, external ear and ear canal normal. Tympanic membrane is not erythematous, not retracted and not bulging. A middle ear effusion is present.  Nose: Mucosal edema and rhinorrhea present. Right sinus exhibits frontal sinus tenderness. Right sinus exhibits no maxillary sinus tenderness. Left sinus exhibits frontal sinus tenderness. Left sinus exhibits no maxillary sinus tenderness.  Mouth/Throat: Uvula is midline, oropharynx is clear and moist and mucous membranes are normal. No oropharyngeal exudate, posterior oropharyngeal edema or  posterior oropharyngeal erythema.  Eyes: Conjunctivae, EOM and lids are normal. Pupils are equal, round, and reactive to light. Lids are everted and swept, no foreign bodies found.  Neck: Trachea normal and normal range of motion. Neck supple. Carotid bruit is not present. No thyroid mass and no thyromegaly present.  Cardiovascular: Normal rate, regular rhythm, S1 normal, S2 normal, normal heart sounds, intact distal pulses and normal pulses.  Exam reveals no gallop and no friction rub.   No murmur heard. Pulmonary/Chest: Effort normal and breath sounds normal. No tachypnea. No respiratory distress. She has no decreased breath sounds. She has no wheezes. She has no rhonchi. She has no rales.  Neurological: She is alert.  Skin: Skin is warm, dry and intact. No rash noted.  Psychiatric: Her speech is normal and behavior is normal. Judgment normal. Her mood appears not anxious. Cognition and memory are normal. She does not exhibit a depressed mood.          Assessment & Plan:

## 2016-06-01 NOTE — Progress Notes (Signed)
Pre visit review using our clinic review tool, if applicable. No additional management support is needed unless otherwise documented below in the visit note. 

## 2016-06-01 NOTE — Patient Instructions (Addendum)
Complete a course of augmentin x 10 days. Take probiotic while on it.  Complete a prednisone taper. Hold flonase while on.  Can continue nasal saline spray and mucinex DM.

## 2016-06-09 ENCOUNTER — Encounter: Payer: Self-pay | Admitting: Family Medicine

## 2016-06-09 ENCOUNTER — Ambulatory Visit (INDEPENDENT_AMBULATORY_CARE_PROVIDER_SITE_OTHER): Payer: Medicare Other | Admitting: Family Medicine

## 2016-06-09 VITALS — BP 90/60 | HR 62 | Temp 98.3°F | Ht 63.5 in | Wt 149.2 lb

## 2016-06-09 DIAGNOSIS — Z0000 Encounter for general adult medical examination without abnormal findings: Secondary | ICD-10-CM | POA: Diagnosis not present

## 2016-06-09 DIAGNOSIS — K219 Gastro-esophageal reflux disease without esophagitis: Secondary | ICD-10-CM

## 2016-06-09 DIAGNOSIS — J0100 Acute maxillary sinusitis, unspecified: Secondary | ICD-10-CM

## 2016-06-09 DIAGNOSIS — Z853 Personal history of malignant neoplasm of breast: Secondary | ICD-10-CM

## 2016-06-09 DIAGNOSIS — K760 Fatty (change of) liver, not elsewhere classified: Secondary | ICD-10-CM

## 2016-06-09 DIAGNOSIS — E782 Mixed hyperlipidemia: Secondary | ICD-10-CM | POA: Diagnosis not present

## 2016-06-09 DIAGNOSIS — Q613 Polycystic kidney, unspecified: Secondary | ICD-10-CM

## 2016-06-09 NOTE — Progress Notes (Signed)
Pre visit review using our clinic review tool, if applicable. No additional management support is needed unless otherwise documented below in the visit note. 

## 2016-06-09 NOTE — Progress Notes (Signed)
Subjective:    Patient ID: Sydney Ortiz, female    DOB: 1945/12/04, 71 y.o.   MRN: [id number]  HPI Pt presents for CPX and follow up on chronic health issues. The patient saw Candis Musa, LPN for medicare wellness. Note reviewed in detail and important notes copied below. Abnormal screenings:   Hearing - failed Mini-Cog score: 19/20  Paroxsysmal PSVT: no further issues.  PKD: followed by nephrology. Stabel CR, GFR.  Microscopic colitis: Stable, followed by GI. Last flare 1 year ago.  Fatty liver and elevated LFTs: resolved  Elevated Cholesterol:  Good control on no med. AHA risk calc 5.2%. Lab Results  Component Value Date   CHOL 192 05/23/2016   HDL 71.70 05/23/2016   LDLCALC 101 (H) 05/23/2016   LDLDIRECT 130.8 11/15/2009   TRIG 101.0 05/23/2016   CHOLHDL 3 05/23/2016   Diet compliance: restricted Exercise: walking 3 days a week.. Was doing water aerobics. Other complaints:   Seen last week for  Acute sinus infection, incomplete treatment.. Second course of antibiotics, also given prednisone. Today she reports her symptoms are improved.  Prednisone has also helped right shoulder pain.  She is healing from fracture in left wrist... Now cast off and wearing the brace. Follow up with ORTHO in 4 weeks.   Social History /Family History/Past Medical History reviewed and updated if needed. Blood pressure 90/60, pulse 62, temperature 98.3 F (36.8 C), temperature source Oral, height 5' 3.5" (1.613 m), weight 149 lb 4 oz (67.7 kg). Body mass index is 26.02 kg/m.   Review of Systems  Constitutional: Negative for fatigue and fever.  HENT: Negative for congestion.   Eyes: Negative for pain.  Respiratory: Negative for cough and shortness of breath.   Cardiovascular: Negative for chest pain, palpitations and leg swelling.  Gastrointestinal: Negative for abdominal pain.  Genitourinary: Negative for dysuria and vaginal bleeding.  Musculoskeletal: Negative for back  pain.  Neurological: Negative for syncope, light-headedness and headaches.  Psychiatric/Behavioral: Negative for dysphoric mood.       Objective:   Physical Exam  Constitutional: Vital signs are normal. She appears well-developed and well-nourished. She is cooperative.  Non-toxic appearance. She does not appear ill. No distress.  HENT:  Head: Normocephalic.  Right Ear: Hearing, tympanic membrane, external ear and ear canal normal.  Left Ear: Hearing, tympanic membrane, external ear and ear canal normal.  Nose: Nose normal.  Eyes: Conjunctivae, EOM and lids are normal. Pupils are equal, round, and reactive to light. Lids are everted and swept, no foreign bodies found.  Neck: Trachea normal and normal range of motion. Neck supple. Carotid bruit is not present. No thyroid mass and no thyromegaly present.  Cardiovascular: Normal rate, regular rhythm, S1 normal, S2 normal, normal heart sounds and intact distal pulses.  Exam reveals no gallop.   No murmur heard. Pulmonary/Chest: Effort normal and breath sounds normal. No respiratory distress. She has no wheezes. She has no rhonchi. She has no rales.  Abdominal: Soft. Normal appearance and bowel sounds are normal. She exhibits no distension, no fluid wave, no abdominal bruit and no mass. There is no hepatosplenomegaly. There is no tenderness. There is no rebound, no guarding and no CVA tenderness. No hernia.  Lymphadenopathy:    She has no cervical adenopathy.    She has no axillary adenopathy.  Neurological: She is alert. She has normal strength. No cranial nerve deficit or sensory deficit.  Skin: Skin is warm, dry and intact. No rash noted.  Psychiatric: Her speech is  normal and behavior is normal. Judgment normal. Her mood appears not anxious. Cognition and memory are normal. She does not exhibit a depressed mood.          Assessment & Plan:  The patient's preventative maintenance and recommended screening tests for an annual wellness  exam were reviewed in full today. Brought up to date unless services declined.  Counselled on the importance of diet, exercise, and its role in overall health and mortality. The patient's FH and SH was reviewed, including their home life, tobacco status, and drug and alcohol status.   Mammo/breast exam: personal history of breast cancer: stable 04/2016 Vaccines: Uptodate with PNA, flu, shingles, TDap, prevnar. DVE/pap:partial hysterectomy, one ovary remains. DVE not indicated, no pap indicated.  Colon: 09/2014 bx microscopic colitis, repeat in 10 years Dr. Clydene Laming at [facility]. DXA: 05/2012 Normal..due in 5 years. Former smoker minimal use.

## 2016-06-09 NOTE — Assessment & Plan Note (Signed)
Improving S/P second course of antibiotics and pred taper.

## 2016-06-12 ENCOUNTER — Other Ambulatory Visit: Payer: Self-pay | Admitting: Family Medicine

## 2016-09-03 HISTORY — PX: UPPER GI ENDOSCOPY: SHX6162

## 2016-12-19 ENCOUNTER — Encounter: Payer: Self-pay | Admitting: Family Medicine

## 2016-12-19 LAB — HM DIABETES EYE EXAM

## 2016-12-26 ENCOUNTER — Other Ambulatory Visit: Payer: Self-pay | Admitting: Nephrology

## 2016-12-26 DIAGNOSIS — N181 Chronic kidney disease, stage 1: Secondary | ICD-10-CM

## 2017-01-01 ENCOUNTER — Ambulatory Visit
Admission: RE | Admit: 2017-01-01 | Discharge: 2017-01-01 | Disposition: A | Discharge status: Home / Self Care | Payer: Medicare Other | Source: Ambulatory Visit | Attending: Nephrology | Admitting: Nephrology

## 2017-01-01 DIAGNOSIS — N181 Chronic kidney disease, stage 1: Secondary | ICD-10-CM

## 2017-03-01 ENCOUNTER — Other Ambulatory Visit: Payer: Self-pay | Admitting: Family Medicine

## 2017-03-01 DIAGNOSIS — Z139 Encounter for screening, unspecified: Secondary | ICD-10-CM

## 2017-04-01 ENCOUNTER — Encounter: Payer: Self-pay | Admitting: Family Medicine

## 2017-04-08 ENCOUNTER — Other Ambulatory Visit: Payer: Self-pay

## 2017-04-08 ENCOUNTER — Encounter: Payer: Self-pay | Admitting: Emergency Medicine

## 2017-04-08 ENCOUNTER — Emergency Department
Admission: EM | Admit: 2017-04-08 | Discharge: 2017-04-08 | Disposition: A | Discharge status: Home / Self Care | Payer: Medicare Other | Attending: Emergency Medicine | Admitting: Emergency Medicine

## 2017-04-08 DIAGNOSIS — S0591XA Unspecified injury of right eye and orbit, initial encounter: Secondary | ICD-10-CM | POA: Diagnosis present

## 2017-04-08 DIAGNOSIS — Z79899 Other long term (current) drug therapy: Secondary | ICD-10-CM | POA: Diagnosis not present

## 2017-04-08 DIAGNOSIS — J45909 Unspecified asthma, uncomplicated: Secondary | ICD-10-CM | POA: Insufficient documentation

## 2017-04-08 DIAGNOSIS — Y92018 Other place in single-family (private) house as the place of occurrence of the external cause: Secondary | ICD-10-CM | POA: Diagnosis not present

## 2017-04-08 DIAGNOSIS — W208XXA Other cause of strike by thrown, projected or falling object, initial encounter: Secondary | ICD-10-CM | POA: Insufficient documentation

## 2017-04-08 DIAGNOSIS — H10211 Acute toxic conjunctivitis, right eye: Secondary | ICD-10-CM

## 2017-04-08 DIAGNOSIS — Z87891 Personal history of nicotine dependence: Secondary | ICD-10-CM | POA: Diagnosis not present

## 2017-04-08 DIAGNOSIS — Y999 Unspecified external cause status: Secondary | ICD-10-CM | POA: Insufficient documentation

## 2017-04-08 DIAGNOSIS — H1031 Unspecified acute conjunctivitis, right eye: Secondary | ICD-10-CM | POA: Insufficient documentation

## 2017-04-08 DIAGNOSIS — Y93E2 Activity, laundry: Secondary | ICD-10-CM | POA: Insufficient documentation

## 2017-04-08 MED ORDER — FLUORESCEIN SODIUM 1 MG OP STRP
1.0000 | ORAL_STRIP | Freq: Once | OPHTHALMIC | Status: AC
  Administered 2017-04-08: 1 via OPHTHALMIC
  Filled 2017-04-08: qty 1

## 2017-04-08 MED ORDER — ERYTHROMYCIN 5 MG/GM OP OINT: TOPICAL_OINTMENT | Freq: Three times a day (TID) | OPHTHALMIC | 0 refills | Status: AC

## 2017-04-08 MED ORDER — FLUORESCEIN SODIUM 1 MG OP STRP
ORAL_STRIP | OPHTHALMIC | Status: AC
  Administered 2017-04-08: 1 via OPHTHALMIC
  Filled 2017-04-08: qty 1

## 2017-04-08 NOTE — ED Notes (Signed)
Patient's eye washed at wash station for 15 minutes. Patient able to hold eye open to ensure proper flushing

## 2017-04-08 NOTE — ED Notes (Signed)
NAD noted at time of D/C. Pt denies questions or concerns. Pt ambulatory to the lobby at this time.  

## 2017-04-08 NOTE — ED Notes (Signed)
Taken to c-pod to utilizes eye wash station, informed RN staff that pt at eye wash station

## 2017-04-08 NOTE — ED Triage Notes (Signed)
Patient reports she was doing laundry when the bottle of detergent fell, hitting her on the head with soap getting in her right eye. Patient reports washing eye out in sink at home. Upon arrival redness and swelling noted to right eye. Patient reports continued pain to right eye.

## 2017-04-08 NOTE — ED Provider Notes (Signed)
[facility] Emergency Department Provider Note   ____________________________________________    I have reviewed the triage vital signs and the nursing notes.   HISTORY  Chief Complaint Eye Problem     HPI Sydney Ortiz is a 72 y.o. female presents with complaints of right eye redness and burning after spilling detergent her eye.  Patient reports she was doing laundry when a powdery detergent fell from a shelf while on her head and got into her right eye.  She reports her friend immediately took her to the shower to rinse her eye.  She then came to the emergency department.  She complains of burning pain in the right eye with tearing. [employer] laundry detergent   Past Medical History:  Diagnosis Date  . AR (allergic rhinitis)   . Asthma   . Breast cancer ([place])    hx; s/p masectomy 1994  . Esophageal dysmotility    severe; has been elevated by rheumatology at [facility] but no connective tissue disease diagnosis was made. was ANA positive but anti-SCL 7- negative . 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  Marland Kitchen GERD (gastroesophageal reflux disease)    24 hr pH study did not show significant acid refulux  . HLD (hyperlipidemia)    elevated triglycerides  . Impaired fasting glucose   . Migraine headache   . Osteoarthritis   . Paroxysmal SVT (supraventricular tachycardia) (HCC)    hx; 5 beat run noted on holter monitor in 2005. EF 65%  . PKD (polycystic kidney disease)     Patient Active Problem List   Diagnosis Date Noted  . Acute sinusitis 05/16/2016  . Counseling regarding end of life decision making 04/23/2014  . Allergic dermatitis 12/12/2013  . Chronic diarrhea 12/12/2013  . Chronic insomnia 04/08/2013  . Fatty liver 05/10/2010  . HYPERSPLENISM 04/29/2009  . CARPAL TUNNEL SYNDROME, BILATERAL 03/26/2009  . Hyperlipidemia 04/10/2007  . MIGRAINE HEADACHE 04/10/2007  . Allergic rhinitis 04/10/2007  . ASTHMA 04/10/2007  . GERD 04/10/2007    . OSTEOARTHRITIS 04/10/2007  . Polycystic kidney disease 04/10/2007  . BREAST CANCER, HX OF 04/10/2007  . SUPRAVENTRICULAR TACHYCARDIA, PAROXYSMAL, HX OF 04/10/2007    Past Surgical History:  Procedure Laterality Date  . abt CT test  6/07   bilateral cysts, larget on R 9.6 x 6.7 cm  . BREAST RECONSTRUCTION  9/11  . colonoscopy/EGD  2005  . holter monitor  1/05   PVC, PACs, SVT  . kidney surgery - cyst removal  3/06  . PARTIAL HYSTERECTOMY  1983   due to fibroids  . radial masectomy  1993   R side  . right carpal tunnel  7/11  . rotator cuff, right  2004    Prior to Admission medications   Medication Sig Start Date End Date Taking? Authorizing Provider  amoxicillin-clavulanate (AUGMENTIN) 875-125 MG tablet Take 1 tablet by mouth 2 (two) times daily. 06/01/16   Bedsole, Amy E, MD  diphenhydramine-acetaminophen (TYLENOL PM) 25-500 MG TABS Take 1 tablet by mouth at bedtime as needed.    [provider]  erythromycin [facility]) ophthalmic ointment Place into the right eye 3 (three) times daily for 10 days. Place a 1/2 inch ribbon of ointment into the lower eyelid. 04/08/17 04/18/17  Lavonia Drafts, MD  fenofibrate (TRICOR) 145 MG tablet TAKE 1 TABLET BY MOUTH DAILY 06/12/16   Bedsole, Amy E, MD  fexofenadine (ALLEGRA) 60 MG tablet Take 60 mg by mouth daily.     [provider]  fluticasone Asencion Islam)  50 MCG/ACT nasal spray Place 2 sprays into both nostrils daily.    [provider]  hyoscyamine (ANASPAZ) 0.125 MG TBDP Place 0.125 mg under the tongue as needed.      [provider]  OVER THE COUNTER MEDICATION Alpha CRS+ 2 tablets by mouth daily    [provider]  OVER THE COUNTER MEDICATION Micro Plex MVP one tablet by mouth daily    [provider]  predniSONE (DELTASONE) 10 MG tablet Take 1 tablet (10 mg total) by mouth daily with breakfast. 06/01/16   Diona Browner, Amy E, MD  Probiotic Product (PROBIOTIC-10 PO) Take by mouth.    [provider]     Allergies Patient has no known allergies.  Family History  Problem Relation Age of Onset  . Arthritis Father   . Prostate cancer Father   . Hypertension Father   . Polycystic kidney disease Mother   . CVA Mother   . Dementia Mother   . Heart Problems Mother        valve disease  . Diabetes Unknown        DM - MU and MA   . Heart attack Brother   . Hypertension Brother   . Ulcers Brother     Social History Social History   Tobacco Use  . Smoking status: Former Research scientist (life sciences)  . Smokeless tobacco: Never Used  Substance Use Topics  . Alcohol use: Yes    Comment: moderate   . Drug use: No    Review of Systems  Constitutional: No dizziness Eyes: Burning right eye ENT: No sore throat.  Respiratory: No wheezing or difficulty breathing    Skin: Negative for rash. Neurological: Negative for headaches    ____________________________________________   PHYSICAL EXAM:  VITAL SIGNS: ED Triage Vitals  Enc Vitals Group     BP 04/08/17 1534 (!) 151/70     Pulse Rate 04/08/17 1534 74     Resp 04/08/17 1534 18     Temp 04/08/17 1534 97.9 F (36.6 C)     Temp Source 04/08/17 1534 Oral     SpO2 04/08/17 1534 96 %     Weight 04/08/17 1431 63 kg (139 lb)     Height 04/08/17 1431 1.6 m (5\' 3" )     Head Circumference --      Peak Flow --      Pain Score 04/08/17 1434 6     Pain Loc --      Pain Edu? --      Excl. in [place]? --     Constitutional: Alert and oriented. No acute distress. Pleasant and interactive Eyes: Right eye conjunctivitis erythematous, pupils normal, PERRLA, EOMI Head: Atraumatic. Nose: No congestion/rhinnorhea. Mouth/Throat: Mucous membranes are moist.     Respiratory: Normal respiratory effort.  No retractions. Lungs CTAB.   Musculoskeletal:  Warm and well perfused Neurologic:  Normal speech and language. No gross focal neurologic deficits are appreciated.  Skin:  Skin is warm, dry and intact. No rash noted. Psychiatric: Mood  and affect are normal. Speech and behavior are normal.  ____________________________________________   LABS (all labs ordered are listed, but only abnormal results are displayed)  Labs Reviewed - No data to display ____________________________________________  EKG  None ____________________________________________  RADIOLOGY  None ____________________________________________   PROCEDURES  Procedure(s) performed: No  Procedures   Critical Care performed: No ____________________________________________   INITIAL IMPRESSION / ASSESSMENT AND PLAN / ED COURSE  Pertinent labs & imaging results that were available during  my care of the patient were reviewed by me and considered in my medical decision making (see chart for details).  Patient's eye rinsed continuously for 20 minutes in the emergency department.  Afterwards fluorescein stain demonstrated mild cornea irritation but no ulceration or abrasion.  Will treat with erythromycin ointment and have the patient follow-up with her ophthalmologist.    ____________________________________________   FINAL CLINICAL IMPRESSION(S) / ED DIAGNOSES  Final diagnoses:  Chemical conjunctivitis of right eye        Note:  This document was prepared using Dragon voice recognition software and may include unintentional dictation errors.    Lavonia Drafts, MD 04/08/17 [id number]

## 2017-04-10 ENCOUNTER — Other Ambulatory Visit: Payer: Self-pay | Admitting: *Deleted

## 2017-04-10 ENCOUNTER — Ambulatory Visit
Admission: RE | Admit: 2017-04-10 | Discharge: 2017-04-10 | Disposition: A | Discharge status: Home / Self Care | Payer: Medicare Other | Source: Ambulatory Visit | Attending: Family Medicine | Admitting: Family Medicine

## 2017-04-10 DIAGNOSIS — Z139 Encounter for screening, unspecified: Secondary | ICD-10-CM

## 2017-05-30 ENCOUNTER — Ambulatory Visit: Payer: Medicare Other

## 2017-07-10 ENCOUNTER — Ambulatory Visit (INDEPENDENT_AMBULATORY_CARE_PROVIDER_SITE_OTHER): Payer: Medicare Other

## 2017-07-10 ENCOUNTER — Telehealth: Payer: Self-pay | Admitting: Family Medicine

## 2017-07-10 VITALS — BP 110/78 | HR 59 | Temp 98.1°F | Ht 62.5 in | Wt 147.5 lb

## 2017-07-10 DIAGNOSIS — E782 Mixed hyperlipidemia: Secondary | ICD-10-CM

## 2017-07-10 DIAGNOSIS — Z0000 Encounter for general adult medical examination without abnormal findings: Secondary | ICD-10-CM | POA: Diagnosis not present

## 2017-07-10 LAB — LIPID PANEL
CHOL/HDL RATIO: 2
Cholesterol: 176 mg/dL (ref 0–200)
HDL: 71.6 mg/dL (ref >39.00)
LDL CALC: 87 mg/dL (ref 0–99)
NONHDL: 104.74
TRIGLYCERIDES: 91 mg/dL (ref 0.0–149.0)
VLDL: 18.2 mg/dL (ref 0.0–40.0)

## 2017-07-10 LAB — COMPREHENSIVE METABOLIC PANEL
ALT: 15 U/L (ref 0–35)
AST: 19 U/L (ref 0–37)
Albumin: 4 g/dL (ref 3.5–5.2)
Alkaline Phosphatase: 45 U/L (ref 39–117)
BUN: 17 mg/dL (ref 6–23)
CO2: 30 meq/L (ref 19–32)
Calcium: 9.6 mg/dL (ref 8.4–10.5)
Chloride: 106 mEq/L (ref 96–112)
Creatinine, Ser: 0.9 mg/dL (ref 0.40–1.20)
GFR: 65.39 mL/min (ref >60.00)
GLUCOSE: 89 mg/dL (ref 70–99)
Potassium: 4 mEq/L (ref 3.5–5.1)
Sodium: 142 mEq/L (ref 135–145)
TOTAL PROTEIN: 6.7 g/dL (ref 6.0–8.3)
Total Bilirubin: 0.4 mg/dL (ref 0.2–1.2)

## 2017-07-10 NOTE — Patient Instructions (Signed)
Sydney Ortiz , Thank you for taking time to come for your Medicare Wellness Visit. I appreciate your ongoing commitment to your health goals. Please review the following plan we discussed and let me know if I can assist you in the future.   These are the goals we discussed: Goals    . Increase physical activity     Starting 07/10/2017, I will continue to walk for 2 miles daily, to do water aerobics for 60 minutes 3 days per week, and to bicycle occasionally.        This is a list of the screening recommended for you and due dates:  Health Maintenance  Topic Date Due  . Flu Shot  10/04/2017  . Mammogram  04/10/2018  . Tetanus Vaccine  08/05/2022  . Colon Cancer Screening  09/10/2024  . DEXA scan (bone density measurement)  Completed  .  Hepatitis C: One time screening is recommended by Center for Disease Control  (CDC) for  adults born from [birth year] through 1965.   Completed  . Pneumonia vaccines  Completed   Preventive Care for Adults  A healthy lifestyle and preventive care can promote health and wellness. Preventive health guidelines for adults include the following key practices.  . A routine yearly physical is a good way to check with your health care provider about your health and preventive screening. It is a chance to share any concerns and updates on your health and to receive a thorough exam.  . Visit your dentist for a routine exam and preventive care every 6 months. Brush your teeth twice a day and floss once a day. Good oral hygiene prevents tooth decay and gum disease.  . The frequency of eye exams is based on your age, health, family medical history, use  of contact lenses, and other factors. Follow your health care provider's recommendations for frequency of eye exams.  . Eat a healthy diet. Foods like vegetables, fruits, whole grains, low-fat dairy products, and lean protein foods contain the nutrients you need without too many calories. Decrease your intake of foods high in  solid fats, added sugars, and salt. Eat the right amount of calories for you. Get information about a proper diet from your health care provider, if necessary.  . Regular physical exercise is one of the most important things you can do for your health. Most adults should get at least 150 minutes of moderate-intensity exercise (any activity that increases your heart rate and causes you to sweat) each week. In addition, most adults need muscle-strengthening exercises on 2 or more days a week.  Silver Sneakers may be a benefit available to you. To determine eligibility, you may visit the website: www.silversneakers.com or contact program at 423-448-5548 Mon-Fri between 8AM-8PM.   . Maintain a healthy weight. The body mass index (BMI) is a screening tool to identify possible weight problems. It provides an estimate of body fat based on height and weight. Your health care provider can find your BMI and can help you achieve or maintain a healthy weight.   For adults 20 years and older: ? A BMI below 18.5 is considered underweight. ? A BMI of 18.5 to 24.9 is normal. ? A BMI of 25 to 29.9 is considered overweight. ? A BMI of 30 and above is considered obese.   . Maintain normal blood lipids and cholesterol levels by exercising and minimizing your intake of saturated fat. Eat a balanced diet with plenty of fruit and vegetables. Blood tests for lipids and  cholesterol should begin at age 60 and be repeated every 5 years. If your lipid or cholesterol levels are high, you are over 50, or you are at high risk for heart disease, you may need your cholesterol levels checked more frequently. Ongoing high lipid and cholesterol levels should be treated with medicines if diet and exercise are not working.  . If you smoke, find out from your health care provider how to quit. If you do not use tobacco, please do not start.  . If you choose to drink alcohol, please do not consume more than 2 drinks per day. One drink  is considered to be 12 ounces (355 mL) of beer, 5 ounces (148 mL) of wine, or 1.5 ounces (44 mL) of liquor.  . If you are 40-45 years old, ask your health care provider if you should take aspirin to prevent strokes.  . Use sunscreen. Apply sunscreen liberally and repeatedly throughout the day. You should seek shade when your shadow is shorter than you. Protect yourself by wearing long sleeves, pants, a wide-brimmed hat, and sunglasses year round, whenever you are outdoors.  . Once a month, do a whole body skin exam, using a mirror to look at the skin on your back. Tell your health care provider of new moles, moles that have irregular borders, moles that are larger than a pencil eraser, or moles that have changed in shape or color.

## 2017-07-10 NOTE — Progress Notes (Signed)
PCP notes:   Health maintenance:  No gaps identified.   Abnormal screenings:   Hearing - failed  Hearing Screening   125Hz  250Hz  500Hz  1000Hz  2000Hz  3000Hz  4000Hz  6000Hz  8000Hz   Right ear:   40 40 40  40    Left ear:   0 40 40  40     Patient concerns:   None  Nurse concerns:  None  Next PCP appt:   07/17/17@1145 

## 2017-07-10 NOTE — Progress Notes (Signed)
Subjective:   Sydney Ortiz is a 72 y.o. female who presents for Medicare Annual (Subsequent) preventive examination.  Review of Systems:  N/A Cardiac Risk Factors include: advanced age (>1men, >42 women);dyslipidemia     Objective:     Vitals: BP 110/78 (BP Location: Right Arm, Patient Position: Sitting, Cuff Size: Normal)   Pulse (!) 59   Temp 98.1 F (36.7 C) (Oral)   Ht 5' 2.5" (1.588 m) Comment: no shoes  Wt 147 lb 8 oz (66.9 kg)   SpO2 97%   BMI 26.55 kg/m   Body mass index is 26.55 kg/m.  Advanced Directives 07/10/2017 04/08/2017 05/23/2016  Does Patient Have a Medical Advance Directive? Yes No Yes  Type of Paramedic of [place];Living will - [place];Living will  Copy of [place] in Chart? Yes - No - copy requested  Would patient like information on creating a medical advance directive? - No - Patient declined -    Tobacco Social History   Tobacco Use  Smoking Status Former Smoker  Smokeless Tobacco Never Used     Counseling given: No   Clinical Intake:  Pre-visit preparation completed: Yes  Pain : No/denies pain Pain Score: 0-No pain     Nutritional Status: BMI 25 -29 Overweight Nutritional Risks: None Diabetes: No  How often do you need to have someone help you when you read instructions, pamphlets, or other written materials from your doctor or pharmacy?: 1 - Never What is the last grade level you completed in school?: Bachelors degree  Interpreter Needed?: No  Comments: pt is a widow and lives at [facility] entered by [employer], LPN  Past Medical History:  Diagnosis Date  . AR (allergic rhinitis)   . Asthma   . Barrett esophagus   . Breast cancer ([place])    hx; s/p masectomy 1994  . Esophageal dysmotility    severe; has been elevated by [facility]ology at [facility] but no connective tissue disease diagnosis was made. was ANA positive but anti-SCL 7- negative .  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  Marland Kitchen GERD (gastroesophageal reflux disease)    24 hr pH study did not show significant acid refulux  . HLD (hyperlipidemia)    elevated triglycerides  . Impaired fasting glucose   . Migraine headache   . Osteoarthritis   . Paroxysmal SVT (supraventricular tachycardia) (HCC)    hx; 5 beat run noted on holter monitor in 2005. EF 65%  . PKD (polycystic kidney disease)    Past Surgical History:  Procedure Laterality Date  . abt CT test  6/07   bilateral cysts, larget on R 9.6 x 6.7 cm  . BREAST RECONSTRUCTION  9/11  . colonoscopy/EGD  2005  . holter monitor  1/05   PVC, PACs, SVT  . kidney surgery - cyst removal  3/06  . MASTECTOMY Right   . PARTIAL HYSTERECTOMY  1983   due to fibroids  . radial masectomy  1993   R side  . right carpal tunnel  7/11  . rotator cuff, right  2004  . UPPER GI ENDOSCOPY  09/2016   Family History  Problem Relation Age of Onset  . Arthritis Father   . Prostate cancer Father   . Hypertension Father   . Polycystic kidney disease Mother   . CVA Mother   . Dementia Mother   . Heart Problems Mother        valve disease  . Diabetes Unknown  DM - MU and MA   . Heart attack Brother   . Hypertension Brother   . Ulcers Brother    Social History   Socioeconomic History  . Marital status: Widowed    Spouse name: Not on file  . Number of children: Not on file  . Years of education: Not on file  . Highest education level: Not on file  Occupational History  . Not on file  Social Needs  . Financial resource strain: Not on file  . Food insecurity:    Worry: Not on file    Inability: Not on file  . Transportation needs:    Medical: Not on file    Non-medical: Not on file  Tobacco Use  . Smoking status: Former Research scientist (life sciences)  . Smokeless tobacco: Never Used  Substance and Sexual Activity  . Alcohol use: Yes    Comment: occasionally  . Drug use: No  . Sexual activity: Yes  Lifestyle  . Physical  activity:    Days per week: Not on file    Minutes per session: Not on file  . Stress: Not on file  Relationships  . Social connections:    Talks on phone: Not on file    Gets together: Not on file    Attends religious service: Not on file    Active member of club or organization: Not on file    Attends meetings of clubs or organizations: Not on file    Relationship status: Not on file  Other Topics Concern  . Not on file  Social History Narrative   Married, 2 adopted children (1B, 1G)   Originally from [place]; moved from [place] to [place].    Regular exercise - treadmill, 2 miles 3x/week; 4 miles daily    Retired - [employer]   Diet: avoids MSG (triggers HA), cultured cheese, coloring.       Pt signed designated party release form and gives Lorine Iannaccone, spouse [phone] and, and son Berneita Sanagustin, [phone], access to medical records. Can leave msg on answering machine [phone] and on cell [phone].      Has living will, [place] husband, full code ( reviewed 2014)    Outpatient Encounter Medications as of 07/10/2017  Medication Sig  . budesonide (ENTOCORT EC) 3 MG 24 hr capsule TAKE 3 CAPSULES BY MOUTH EVERY MORNING  . diphenhydramine-acetaminophen (TYLENOL PM) 25-500 MG TABS Take 1 tablet by mouth at bedtime as needed.  . fenofibrate (TRICOR) 145 MG tablet TAKE 1 TABLET BY MOUTH DAILY  . fexofenadine (ALLEGRA) 60 MG tablet Take 60 mg by mouth daily.   . fluticasone (FLONASE) 50 MCG/ACT nasal spray Place 2 sprays into both nostrils daily.  . hyoscyamine (ANASPAZ) 0.125 MG TBDP Place 0.125 mg under the tongue as needed.    Marland Kitchen OMEPRAZOLE PO Take 20 mg by mouth 2 (two) times daily.  Marland Kitchen OVER THE COUNTER MEDICATION Alpha CRS+ 2 tablets by mouth daily  . OVER THE COUNTER MEDICATION Micro Plex MVP one tablet by mouth daily  . Probiotic Product (PROBIOTIC-10 PO) Take by mouth.  . [DISCONTINUED] predniSONE (DELTASONE) 10 MG tablet Take 1 tablet (10 mg total) by mouth daily with breakfast.   No  facility-administered encounter medications on file as of 07/10/2017.     Activities of Daily Living In your present state of health, do you have any difficulty performing the following activities: 07/10/2017  Hearing? N  Vision? N  Difficulty concentrating or making decisions? N  Walking or climbing stairs? N  Dressing or bathing? N  Doing errands, shopping? N  Preparing Food and eating ? N  Using the Toilet? N  In the past six months, have you accidently leaked urine? N  Do you have problems with loss of bowel control? N  Managing your Medications? N  Managing your Finances? N  Housekeeping or managing your Housekeeping? N  Some recent data might be hidden    Patient Care Team: Jinny Sanders, MD as PCP - General Thelma Comp, [place] as Consulting Physician (Optometry) Newell Coral., MD as Referring Physician (Gastroenterology) Donato Heinz, MD as Consulting Physician (Nephrology)    Assessment:   This is a routine wellness examination for Ara.   Hearing Screening   125Hz  250Hz  500Hz  1000Hz  2000Hz  3000Hz  4000Hz  6000Hz  8000Hz   Right ear:   40 40 40  40    Left ear:   0 40 40  40    Vision Screening Comments: June 2018 with Dr. [name]     Exercise Activities and Dietary recommendations Current Exercise Habits: Home exercise routine;Structured exercise class, Type of exercise: walking;Other - see comments(water aerobics 3x/wk, walking daily, bicycling occassionally), Time (Minutes): 60, Frequency (Times/Week): 3, Weekly Exercise (Minutes/Week): 180, Intensity: Moderate, Exercise limited by: None identified  Goals    . Increase physical activity     Starting 07/10/2017, I will continue to walk for 2 miles daily, to do water aerobics for 60 minutes 3 days per week, and to bicycle occasionally.        Fall Risk Fall Risk  07/10/2017 05/23/2016 05/28/2015 04/23/2014 03/25/2013  Falls in the past year? No No Yes No No  Number falls in past yr: - - 1 - -  Injury  with Fall? - - Yes - -   Depression Screen PHQ 2/9 Scores 07/10/2017 05/23/2016 05/28/2015 04/23/2014  PHQ - 2 Score 0 0 0 0  PHQ- 9 Score 0 - - -     Cognitive Function MMSE - Mini Mental State Exam 07/10/2017 05/23/2016  Orientation to time 5 5  Orientation to Place 5 5  Registration 3 3  Attention/ Calculation 0 0  Recall 3 2  Recall-comments - pt was unable to recall 1 of 3 words  Language- name 2 objects 0 0  Language- repeat 1 1  Language- follow 3 step command 3 3  Language- read & follow direction 0 0  Write a sentence 0 0  Copy design 0 0  Total score 20 19        Immunization History  Administered Date(s) Administered  . Influenza Split 11/21/2010, 12/13/2011  . Influenza Whole 01/04/2007, 12/02/2007, 12/02/2008, 12/29/2009  . Influenza, High Dose Seasonal PF 12/14/2015, 12/04/2016  . Influenza,inj,Quad PF,6+ Mos 12/11/2012, 01/21/2014  . Influenza-Unspecified 01/01/2015  . Pneumococcal Conjugate-13 02/09/2016  . Pneumococcal Polysaccharide-23 06/04/2005, 12/11/2012  . Td 01/05/2004  . Tdap 08/04/2012  . Zoster 07/16/2009   Screening Tests Health Maintenance  Topic Date Due  . INFLUENZA VACCINE  10/04/2017  . MAMMOGRAM  04/10/2018  . TETANUS/TDAP  08/05/2022  . COLONOSCOPY  09/10/2024  . DEXA SCAN  Completed  . Hepatitis C Screening  Completed  . PNA vac Low Risk Adult  Completed      Plan:     I have personally reviewed, addressed, and noted the following in the patient's chart:  A. Medical and social history B. Use of alcohol, tobacco or illicit drugs  C. Current medications and supplements D. Functional ability and status E.  Nutritional  status F.  Physical activity G. Advance directives H. List of other physicians I.  Hospitalizations, surgeries, and ER visits in previous 12 months J.  [place] to include hearing, vision, cognitive, depression L. Referrals and appointments - none  In addition, I have reviewed and discussed with  patient certain preventive protocols, quality metrics, and best practice recommendations. A written personalized care plan for preventive services as well as general preventive health recommendations were provided to patient.  See attached scanned questionnaire for additional information.   Signed,   Lindell Noe, MHA, BS, LPN Health Coach

## 2017-07-10 NOTE — Telephone Encounter (Signed)
-----   Message from Eustace Pen, LPN sent at 3/0/1314 10:23 AM EDT ----- Regarding: Labs 5/7 Lab orders needed. Thank you.  Insurance:  [facility] Medicare

## 2017-07-12 NOTE — Progress Notes (Signed)
I reviewed health advisor's note, was available for consultation, and agree with documentation and plan.  

## 2017-07-17 ENCOUNTER — Ambulatory Visit (INDEPENDENT_AMBULATORY_CARE_PROVIDER_SITE_OTHER): Payer: Medicare Other | Admitting: Family Medicine

## 2017-07-17 ENCOUNTER — Other Ambulatory Visit: Payer: Self-pay

## 2017-07-17 ENCOUNTER — Encounter: Payer: Self-pay | Admitting: Family Medicine

## 2017-07-17 VITALS — BP 110/74 | HR 61 | Temp 97.9°F | Ht 62.5 in | Wt 147.0 lb

## 2017-07-17 DIAGNOSIS — Q613 Polycystic kidney, unspecified: Secondary | ICD-10-CM | POA: Diagnosis not present

## 2017-07-17 DIAGNOSIS — Z0000 Encounter for general adult medical examination without abnormal findings: Secondary | ICD-10-CM | POA: Diagnosis not present

## 2017-07-17 DIAGNOSIS — E2839 Other primary ovarian failure: Secondary | ICD-10-CM

## 2017-07-17 DIAGNOSIS — K22719 Barrett's esophagus with dysplasia, unspecified: Secondary | ICD-10-CM | POA: Diagnosis not present

## 2017-07-17 DIAGNOSIS — K2270 Barrett's esophagus without dysplasia: Secondary | ICD-10-CM | POA: Insufficient documentation

## 2017-07-17 DIAGNOSIS — E782 Mixed hyperlipidemia: Secondary | ICD-10-CM

## 2017-07-17 NOTE — Assessment & Plan Note (Signed)
Followed by GI.Marland Kitchen On PPI.

## 2017-07-17 NOTE — Progress Notes (Signed)
Subjective:    Patient ID: Sydney Ortiz, female    DOB: [birth year], 72 y.o.   MRN: [id number]  HPI  The patient presents for complete physical and review of chronic health problems. He/She also has the following acute concerns today:  The patient saw Candis Musa, LPN for medicare wellness. Note reviewed in detail and important notes copied below.  Health maintenance: No gaps identified.   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  PKD: currently no pain. followed by nephrology. Stable Cr ( 0.9), GFR ( 65).  Microscopic colitis, causing chronic diarrhea: Stable on budesonide, followed by GI. Last flare: currently.  Barretts esophagitis: plan repeat endoscopy in 10/2017 on omeprazole 40 mg daily.  Elevated Cholesterol:  Good control on tricor. AHA risk calc low. Lab Results  Component Value Date   CHOL 176 07/10/2017   HDL 71.60 07/10/2017   LDLCALC 87 07/10/2017   LDLDIRECT 130.8 11/15/2009   TRIG 91.0 07/10/2017   CHOLHDL 2 07/10/2017  Diet compliance: restricted Exercise: walking 3 days a week.. Was doing water aerobics, walking. Also in [employer]. Other complaints:   Social History /Family History/Past Medical History reviewed in detail and updated in EMR if needed. Blood pressure 110/74, pulse 61, temperature 97.9 F (36.6 C), temperature source Oral, height 5' 2.5" (1.588 m), weight 147 lb (66.7 kg). Body mass index is 26.46 kg/m.   Review of Systems  Constitutional: Negative for fatigue and fever.  HENT: Negative for congestion.   Eyes: Negative for pain.  Respiratory: Negative for cough and shortness of breath.   Cardiovascular: Negative for chest pain, palpitations and leg swelling.  Gastrointestinal: Negative for abdominal pain.    Genitourinary: Negative for dysuria and vaginal bleeding.  Musculoskeletal: Negative for back pain.  Neurological: Negative for syncope, light-headedness and headaches.  Psychiatric/Behavioral: Negative for dysphoric mood.       Objective:   Physical Exam  Constitutional: Vital signs are normal. She appears well-developed and well-nourished. She is cooperative.  Non-toxic appearance. She does not appear ill. No distress.  HENT:  Head: Normocephalic.  Right Ear: Hearing, tympanic membrane, external ear and ear canal normal.  Left Ear: Hearing, tympanic membrane, external ear and ear canal normal.  Nose: Nose normal.  Eyes: Pupils are equal, round, and reactive to light. Conjunctivae, EOM and lids are normal. Lids are everted and swept, no foreign bodies found.  Neck: Trachea normal and normal range of motion. Neck supple. Carotid bruit is not present. No thyroid mass and no thyromegaly present.  Cardiovascular: Normal rate, regular rhythm, S1 normal, S2 normal, normal heart sounds and intact distal pulses. Exam reveals no gallop.  No murmur heard. Pulmonary/Chest: Effort normal and breath sounds normal. No respiratory distress. She has no wheezes. She has no rhonchi. She has no rales. No breast tenderness, discharge or bleeding.  Abdominal: Soft. Normal appearance and bowel sounds are normal. She exhibits no distension, no fluid wave, no abdominal bruit and no mass. There is no hepatosplenomegaly. There is no tenderness. There is no rebound, no guarding and no CVA tenderness. No hernia.  Genitourinary: Vagina normal and uterus normal. No breast tenderness, discharge or bleeding.  Pelvic exam was performed with patient supine. There is no rash, tenderness or lesion on the right labia. There is no rash, tenderness or lesion on the left labia. Uterus is not enlarged and not tender. Cervix exhibits no motion tenderness, no discharge and no friability. Right adnexum displays no mass, no tenderness  and no fullness. Left adnexum displays no mass, no tenderness and no fullness.  Lymphadenopathy:    She has no cervical adenopathy.    She has no axillary adenopathy.  Neurological: She is alert. She has normal strength. No cranial nerve deficit or sensory deficit.  Skin: Skin is warm, dry and intact. No rash noted.  Psychiatric: Her speech is normal and behavior is normal. Judgment normal. Her mood appears not anxious. Cognition and memory are normal. She does not exhibit a depressed mood.          Assessment & Plan:  The patient's preventative maintenance and recommended screening tests for an annual wellness exam were reviewed in full today. Brought up to date unless services declined.  Counselled on the importance of diet, exercise, and its role in overall health and mortality. The patient's FH and SH was reviewed, including their home life, tobacco status, and drug and alcohol status.   Mammo/breast exam: personal history of breast cancer: stable 04/2017 Vaccines: Uptodate with PNA, flu, shingles, TDap, prevnar. DVE/pap:partial hysterectomy, one ovary remains. DVE not indicated, no pap indicated.  Colon: 09/2014 bx microscopic colitis, repeat in 10 years Dr. Clydene Laming at [facility]. DXA: 05/2012 Normal..due in 5 years. Former smoker minimal use.

## 2017-07-17 NOTE — Patient Instructions (Signed)
Please stop at the front desk to set up referral.  

## 2017-07-17 NOTE — Assessment & Plan Note (Signed)
Followed by nephrology. 

## 2017-07-17 NOTE — Assessment & Plan Note (Addendum)
Good control on tricor. Encouraged exercise, weight loss, healthy eating habits.

## 2017-08-03 ENCOUNTER — Other Ambulatory Visit: Payer: Self-pay | Admitting: Family Medicine

## 2017-08-27 ENCOUNTER — Ambulatory Visit
Admission: RE | Admit: 2017-08-27 | Discharge: 2017-08-27 | Disposition: A | Discharge status: Home / Self Care | Payer: Medicare Other | Source: Ambulatory Visit | Attending: Family Medicine | Admitting: Family Medicine

## 2017-08-27 DIAGNOSIS — E2839 Other primary ovarian failure: Secondary | ICD-10-CM

## 2017-09-28 ENCOUNTER — Encounter: Payer: Self-pay | Admitting: Family Medicine

## 2017-09-28 MED ORDER — SCOPOLAMINE 1 MG/3DAYS TD PT72: 1.0000 | MEDICATED_PATCH | TRANSDERMAL | 0 refills | Status: DC

## 2017-10-05 ENCOUNTER — Encounter: Payer: Self-pay | Admitting: Family Medicine

## 2017-12-26 ENCOUNTER — Encounter: Payer: Self-pay | Admitting: *Deleted

## 2018-01-25 LAB — HM DIABETES EYE EXAM

## 2018-02-12 ENCOUNTER — Other Ambulatory Visit: Payer: Self-pay | Admitting: Nephrology

## 2018-02-12 ENCOUNTER — Ambulatory Visit: Payer: Medicare Other | Admitting: Family Medicine

## 2018-02-12 ENCOUNTER — Ambulatory Visit (INDEPENDENT_AMBULATORY_CARE_PROVIDER_SITE_OTHER): Payer: Medicare Other

## 2018-02-12 ENCOUNTER — Encounter: Payer: Self-pay | Admitting: *Deleted

## 2018-02-12 ENCOUNTER — Encounter: Payer: Self-pay | Admitting: Family Medicine

## 2018-02-12 VITALS — BP 120/62 | HR 64 | Temp 98.3°F | Ht 62.5 in | Wt 152.5 lb

## 2018-02-12 DIAGNOSIS — M7989 Other specified soft tissue disorders: Secondary | ICD-10-CM | POA: Diagnosis not present

## 2018-02-12 DIAGNOSIS — N181 Chronic kidney disease, stage 1: Secondary | ICD-10-CM

## 2018-02-12 DIAGNOSIS — Z853 Personal history of malignant neoplasm of breast: Secondary | ICD-10-CM | POA: Diagnosis not present

## 2018-02-12 DIAGNOSIS — Q613 Polycystic kidney, unspecified: Secondary | ICD-10-CM

## 2018-02-12 LAB — BRAIN NATRIURETIC PEPTIDE: Pro B Natriuretic peptide (BNP): 8 pg/mL (ref 0.0–100.0)

## 2018-02-12 LAB — TSH: TSH: 0.93 u[IU]/mL (ref 0.35–4.50)

## 2018-02-12 NOTE — Progress Notes (Signed)
Subjective:    Patient ID: Sydney Ortiz, female    DOB: [birth year], 72 y.o.   MRN: [id number]  HPI 72 year old female patient with history of breast cancer, SVT, PCKD presents with new onset swelling and pain in right leg in last 3-4 weeks.  Has noted some  Slight swelling in both legs but right worse than left in last week.  Left leg was swollen over weekend 2 x normal.  Pain in entire leg, occ wakes her up at night. Muscle pain.Marland Kitchen No joint pain, redness or swelling.  She has been active lately.Marland Kitchen Helping daughter pack and upack.   improved some with elevating legs.   Did  Have trip to [place] in plane 2 hours 1 week ago.  Also trip to [place] earlier in year.   No new medications.  No falls  no low back pain ( some stiffness in low back at times), no left leg numbness or weakness.   no SOB, no chest pain.   Labs with CKD MD yesterday.  Blood pressure 120/62, pulse 64, temperature 98.3 F (36.8 C), temperature source Oral, height 5' 2.5" (1.588 m), weight 152 lb 8 oz (69.2 kg). Social History /Family History/Past Medical History reviewed in detail and updated in EMR if needed.  Review of Systems  Constitutional: Negative for fatigue and fever.  HENT: Negative for congestion.   Eyes: Negative for pain.  Respiratory: Negative for cough and shortness of breath.   Cardiovascular: Negative for chest pain, palpitations and leg swelling.  Gastrointestinal: Negative for abdominal pain.  Genitourinary: Negative for dysuria and vaginal bleeding.  Musculoskeletal: Negative for back pain.  Neurological: Negative for syncope, light-headedness and headaches.  Psychiatric/Behavioral: Negative for dysphoric mood.       Objective:   Physical Exam  Constitutional: Vital signs are normal. She appears well-developed and well-nourished. She is cooperative.  Non-toxic appearance. She does not appear ill. No distress.  HENT:  Head: Normocephalic.  Right Ear: Hearing, tympanic membrane, external ear  and ear canal normal. Tympanic membrane is not erythematous, not retracted and not bulging.  Left Ear: Hearing, tympanic membrane, external ear and ear canal normal. Tympanic membrane is not erythematous, not retracted and not bulging.  Nose: No mucosal edema or rhinorrhea. Right sinus exhibits no maxillary sinus tenderness and no frontal sinus tenderness. Left sinus exhibits no maxillary sinus tenderness and no frontal sinus tenderness.  Mouth/Throat: Uvula is midline, oropharynx is clear and moist and mucous membranes are normal.  Eyes: Pupils are equal, round, and reactive to light. Conjunctivae, EOM and lids are normal. Lids are everted and swept, no foreign bodies found.  Neck: Trachea normal and normal range of motion. Neck supple. Carotid bruit is not present. No thyroid mass and no thyromegaly present.  Cardiovascular: Normal rate, regular rhythm, S1 normal, S2 normal, normal heart sounds, intact distal pulses and normal pulses. Exam reveals no gallop and no friction rub.  No murmur heard.  Right leg , 1 plus edema nonpitting, varicose veins mpre prominent than on left.  left no edema  Pain in thigh and calf on left with squeeze  Pulmonary/Chest: Effort normal and breath sounds normal. No tachypnea. No respiratory distress. She has no decreased breath sounds. She has no wheezes. She has no rhonchi. She has no rales.  Abdominal: Soft. Normal appearance and bowel sounds are normal. There is no tenderness.  Neurological: She is alert.  Skin: Skin is warm, dry and intact. No rash noted.  Psychiatric: Her speech is  normal and behavior is normal. Judgment and thought content normal. Her mood appears not anxious. Cognition and memory are normal. She does not exhibit a depressed mood.          Assessment & Plan:

## 2018-02-12 NOTE — Assessment & Plan Note (Signed)
Eval with labs  For etiology.  Rule out DVT with [place] given  Recent car trips, pain on exam and history although remote of cancer.

## 2018-02-12 NOTE — Patient Instructions (Addendum)
Please stop at the lab to have labs drawn. Please stop at the front desk to set up referral.  

## 2018-02-15 ENCOUNTER — Other Ambulatory Visit: Payer: Medicare Other

## 2018-02-18 ENCOUNTER — Ambulatory Visit
Admission: RE | Admit: 2018-02-18 | Discharge: 2018-02-18 | Disposition: A | Discharge status: Home / Self Care | Payer: Medicare Other | Source: Ambulatory Visit | Attending: Nephrology | Admitting: Nephrology

## 2018-02-18 DIAGNOSIS — N181 Chronic kidney disease, stage 1: Secondary | ICD-10-CM

## 2018-02-18 DIAGNOSIS — Q613 Polycystic kidney, unspecified: Secondary | ICD-10-CM

## 2018-03-13 ENCOUNTER — Encounter: Payer: Self-pay | Admitting: Family Medicine

## 2018-03-13 ENCOUNTER — Ambulatory Visit: Payer: Medicare Other | Admitting: Family Medicine

## 2018-03-13 VITALS — BP 122/64 | HR 72 | Temp 98.3°F | Ht 62.5 in | Wt 152.8 lb

## 2018-03-13 DIAGNOSIS — H66002 Acute suppurative otitis media without spontaneous rupture of ear drum, left ear: Secondary | ICD-10-CM

## 2018-03-13 DIAGNOSIS — J0100 Acute maxillary sinusitis, unspecified: Secondary | ICD-10-CM | POA: Diagnosis not present

## 2018-03-13 MED ORDER — AMOXICILLIN-POT CLAVULANATE 875-125 MG PO TABS: 1.0000 | ORAL_TABLET | Freq: Two times a day (BID) | ORAL | 0 refills | Status: AC

## 2018-03-13 NOTE — Progress Notes (Signed)
Subjective:     Sydney Ortiz is a 73 y.o. female presenting for Ear Pain (Left ear pain since Friday 03/08/2018. She was seen at [facility] walk in on 03/10/2018 and was diagnosed with sinusitis. Patient is taking Prednisone and was also giving Hycodan syrup. Has been using Flonase. Left ear pain is still present, feels muffled and hears a heart beat in her ear. No drainage. No fever. Still has headaches, fatigue.)     URI   This is a new problem. The current episode started in the past 7 days. The problem has been unchanged. There has been no fever. Associated symptoms include congestion, ear pain (left), headaches, a plugged ear sensation, rhinorrhea and sinus pain. Pertinent negatives include no chest pain, coughing, nausea, sore throat, vomiting or wheezing. She has tried acetaminophen (cough syrup, prednisone, flonase) for the symptoms. The treatment provided mild relief.     Review of Systems  HENT: Positive for congestion, ear pain (left), rhinorrhea and sinus pain. Negative for sore throat.   Respiratory: Negative for cough and wheezing.   Cardiovascular: Negative for chest pain.  Gastrointestinal: Negative for nausea and vomiting.  Neurological: Positive for headaches.   03/08/2017: Sinus symptoms - no abx, low dose prednisone and hycodan.   Social History   Tobacco Use  Smoking Status Former Smoker  Smokeless Tobacco Never Used        Objective:    BP Readings from Last 3 Encounters:  03/13/18 122/64  02/12/18 120/62  07/17/17 110/74   Wt Readings from Last 3 Encounters:  03/13/18 152 lb 12 oz (69.3 kg)  02/12/18 152 lb 8 oz (69.2 kg)  07/17/17 147 lb (66.7 kg)    BP 122/64   Pulse 72   Temp 98.3 F (36.8 C)   Ht 5' 2.5" (1.588 m)   Wt 152 lb 12 oz (69.3 kg)   SpO2 95%   BMI 27.49 kg/m    Physical Exam Constitutional:      General: She is not in acute distress.    Appearance: She is well-developed. She is not diaphoretic.  HENT:     Head:  Normocephalic and atraumatic.     Right Ear: Tympanic membrane and ear canal normal.     Left Ear: Ear canal normal. A middle ear effusion is present. Tympanic membrane is erythematous and bulging.     Nose: Mucosal edema and rhinorrhea present.     Right Sinus: No maxillary sinus tenderness or frontal sinus tenderness.     Left Sinus: Maxillary sinus tenderness and frontal sinus tenderness present.     Mouth/Throat:     Pharynx: Uvula midline. Posterior oropharyngeal erythema present. No oropharyngeal exudate.     Tonsils: Swelling: 0 on the right. 0 on the left.  Eyes:     General: No scleral icterus.    Conjunctiva/sclera: Conjunctivae normal.  Neck:     Musculoskeletal: Neck supple.  Cardiovascular:     Rate and Rhythm: Normal rate and regular rhythm.     Heart sounds: Normal heart sounds. No murmur.  Pulmonary:     Effort: Pulmonary effort is normal. No respiratory distress.     Breath sounds: Normal breath sounds.  Lymphadenopathy:     Cervical: No cervical adenopathy.  Skin:    General: Skin is warm and dry.     Capillary Refill: Capillary refill takes less than 2 seconds.  Neurological:     Mental Status: She is alert.  Assessment & Plan:   Problem List Items Addressed This Visit    None    Visit Diagnoses    Acute non-recurrent maxillary sinusitis    -  Primary   Relevant Medications   HYDROcodone-homatropine (HYCODAN) 5-1.5 MG/5ML syrup   predniSONE (DELTASONE) 10 MG tablet   amoxicillin-clavulanate (AUGMENTIN) 875-125 MG tablet   Non-recurrent acute suppurative otitis media of left ear without spontaneous rupture of tympanic membrane       Relevant Medications   amoxicillin-clavulanate (AUGMENTIN) 875-125 MG tablet     Given duration of symptoms and exam concerning for otitis media and sinusitis will do antibiotics  Also recommended saline rinse   Return if symptoms worsen or fail to improve.  Lesleigh Noe, MD

## 2018-03-13 NOTE — Patient Instructions (Addendum)
Take antibiotics for 7 days. Let me know if the ear pain is not better in that time and we can give you 3 more days of medicine.   1. Drink plenty of fluids 2. Get lots of rest  Sinus Congestion 1) Neti Pot (Saline rinse) -- 2 times day -- if tolerated 2) Flonase (Store Brand ok) - once daily 3) Over the counter congestion medications  Cough 1) Cough drops can be helpful 2) Nyquil (or nighttime cough medication) 3) Honey is proven to be one of the best cough medications     If you develop fevers (Temperature >100.4), chills, worsening symptoms or symptoms lasting longer than 10 days return to clinic.

## 2018-03-20 ENCOUNTER — Telehealth: Payer: Self-pay

## 2018-03-20 NOTE — Telephone Encounter (Signed)
Pt was seen 03/13/18 with ear and sinus infection. Pt has finished abx and prednisone.pt has no more ear pain and no fever but pt cannot hear out of lt ear. Pt has been doing all of recommendations such as saline flushes and Tylenol. Pt wants to know what to do about not being able to hear out of lt ear. Pt request cb. [facility].

## 2018-03-20 NOTE — Telephone Encounter (Signed)
Called to speak with patient.   Discussed that hearing loss during OM can be normal, but if it is persisting beyond infection and effusion resolution she should have her hearing screened and see ENT.   Advised office visit to have ears re-examined and decision to refer based on re-evaluation.   Routing to pcp as FYI.   Pt planning to call back to schedule appointment on Friday.

## 2018-03-20 NOTE — Telephone Encounter (Signed)
Noted and agree with care and re-eval Friday.

## 2018-03-22 ENCOUNTER — Ambulatory Visit: Payer: Medicare Other | Admitting: Family Medicine

## 2018-03-22 ENCOUNTER — Encounter: Payer: Self-pay | Admitting: Family Medicine

## 2018-03-22 DIAGNOSIS — H6502 Acute serous otitis media, left ear: Secondary | ICD-10-CM

## 2018-03-22 DIAGNOSIS — H6590 Unspecified nonsuppurative otitis media, unspecified ear: Secondary | ICD-10-CM | POA: Insufficient documentation

## 2018-03-22 MED ORDER — PREDNISONE 10 MG PO TABS: ORAL_TABLET | ORAL | 0 refills | Status: DC

## 2018-03-22 NOTE — Patient Instructions (Signed)
Complete prednisone course.  Continue nasal saline.  Call if decreased hearing not improving in 3-4 weeks for consideration of referral to ENT MD.  Call sooner if fever and increased ear pain.

## 2018-03-22 NOTE — Progress Notes (Signed)
Subjective:    Patient ID: Sydney Ortiz, female    DOB: Mar 06, 1946, 73 y.o.   MRN: [id number]  HPI   73 year old female presents for follow up ear infection.  Seen initially at [facility] in 1/5 Dx with sinusitis.Marland Kitchen given prednisone, hycodan syrup.  She was seen on 03/13/2018 given increase in left ear pain for maxillary sinusitis and acute otitis media by Dr. Einar Pheasant.   Treated with Augmentin BID x  7 days   She reports pain is better, still headache but not as bad as before.  She cannot hear out of left ear.  Occ pain at times. No fever.  No SOB. She remains feeling tired.  She is using flonase 2 sprays, saline irrigation BID.  On allegra.  Review of Systems  Constitutional: Negative for fatigue and fever.  HENT: Negative for congestion.   Eyes: Negative for pain.  Respiratory: Negative for cough and shortness of breath.   Cardiovascular: Negative for chest pain, palpitations and leg swelling.  Gastrointestinal: Negative for abdominal pain.  Genitourinary: Negative for dysuria and vaginal bleeding.  Musculoskeletal: Negative for back pain.  Neurological: Negative for syncope, light-headedness and headaches.  Psychiatric/Behavioral: Negative for dysphoric mood.  All other systems reviewed and are negative.      Objective:   Physical Exam Constitutional:      General: She is not in acute distress.    Appearance: Normal appearance. She is well-developed. She is not ill-appearing or toxic-appearing.  HENT:     Head: Normocephalic.     Right Ear: Hearing, tympanic membrane, ear canal and external ear normal. Tympanic membrane is not erythematous, retracted or bulging.     Left Ear: Hearing, ear canal and external ear normal. A middle ear effusion is present. Tympanic membrane is not injected, erythematous, retracted or bulging.     Ears:     Comments:  Decreased insuflator movement.    Nose: No mucosal edema or rhinorrhea.     Right Sinus: No maxillary sinus tenderness or  frontal sinus tenderness.     Left Sinus: No maxillary sinus tenderness or frontal sinus tenderness.     Mouth/Throat:     Pharynx: Uvula midline.  Eyes:     General: Lids are normal. Lids are everted, no foreign bodies appreciated.     Conjunctiva/sclera: Conjunctivae normal.     Pupils: Pupils are equal, round, and reactive to light.  Neck:     Musculoskeletal: Normal range of motion and neck supple.     Thyroid: No thyroid mass or thyromegaly.     Vascular: No carotid bruit.     Trachea: Trachea normal.  Cardiovascular:     Rate and Rhythm: Normal rate and regular rhythm.     Pulses: Normal pulses.     Heart sounds: Normal heart sounds, S1 normal and S2 normal. No murmur. No friction rub. No gallop.   Pulmonary:     Effort: Pulmonary effort is normal. No tachypnea or respiratory distress.     Breath sounds: Normal breath sounds. No decreased breath sounds, wheezing, rhonchi or rales.  Abdominal:     General: Bowel sounds are normal.     Palpations: Abdomen is soft.     Tenderness: There is no abdominal tenderness.  Skin:    General: Skin is warm and dry.     Findings: No rash.  Neurological:     Mental Status: She is alert.  Psychiatric:        Mood and Affect: Mood  is not anxious or depressed.        Speech: Speech normal.        Behavior: Behavior normal. Behavior is cooperative.        Thought Content: Thought content normal.        Judgment: Judgment normal.           Assessment & Plan:

## 2018-03-22 NOTE — Assessment & Plan Note (Signed)
Suppurative OM resolved but fluid remains... treat with repeat pred taper (higher dose). If not improving in  4 weeks consider ENT referral for further eval. Pt told may take 2-3 months to resolve.

## 2018-04-09 DIAGNOSIS — H9202 Otalgia, left ear: Secondary | ICD-10-CM

## 2018-04-23 ENCOUNTER — Other Ambulatory Visit: Payer: Self-pay | Admitting: Family Medicine

## 2018-06-04 NOTE — Telephone Encounter (Signed)
Spoke with patient and explained everything. Discussed WebEx vs mychart and details with that. Patient aware of the charge with video visits. Patient aware to call back if she has any questions or concerns.

## 2018-06-13 ENCOUNTER — Other Ambulatory Visit: Payer: Self-pay

## 2018-06-13 ENCOUNTER — Ambulatory Visit (INDEPENDENT_AMBULATORY_CARE_PROVIDER_SITE_OTHER): Payer: Medicare Other | Admitting: Family Medicine

## 2018-06-13 ENCOUNTER — Encounter: Payer: Self-pay | Admitting: Family Medicine

## 2018-06-13 DIAGNOSIS — H1131 Conjunctival hemorrhage, right eye: Secondary | ICD-10-CM | POA: Diagnosis not present

## 2018-06-13 NOTE — Telephone Encounter (Signed)
Copied from [place] [id number]. Topic: Appointment Scheduling - Scheduling Inquiry for Clinic >> Jun 12, 2018  4:39 PM Yvette Rack wrote: Reason for CRM: Pt stated she was asked to call to schedule a virtual appt. Pt requests call back to schedule appt. Cb# [phone]

## 2018-06-13 NOTE — Progress Notes (Signed)
VIRTUAL VISIT Due to national recommendations of social distancing due to [place] 19, a virtual visit is felt to be most appropriate for this patient at this time.   I connected with the patient on 06/13/18 at  9:00 AM EDT by virtual telehealth platform and verified that I am speaking with the correct person using two identifiers.   I discussed the limitations, risks, security and privacy concerns of performing an evaluation and management service by  virtual telehealth platform and the availability of in person appointments. I also discussed with the patient that there may be a patient responsible charge related to this service. The patient expressed understanding and agreed to proceed.  Patient location: Home Provider Location: [facility] Participants: Eliezer Lofts and Earl Many   Chief Complaint  Patient presents with  . Eye Issue    Right-Very blood shot    History of Present Illness:  73 year old female with history of  Polycystic kidney disease,alleric rhinitis and asthma presents with new onset red eye.  She reports noted sudden onset 2 days ago after shower... bloody conjunctiva on right.  Right eye feels heavy. No discharge, minimally itchy. No heaviness.  No headache. No vision changes. No new numbness, no new weakness. No head or eye injury.   The day before she was having issues with sciatica. Took tylenol. No other new meds.   Stopped budesonide since 06/2018   Hx of  1 year eye injury  When got hit in  With soap bottle in eye... went back to normal. Cataract surgery.  BP Readings from Last 3 Encounters:  06/13/18 124/75  03/22/18 104/60  03/13/18 122/64    COVID 19 screen No recent travel or known exposure to [place] The patient denies respiratory symptoms of COVID 19 at this time.  The importance of social distancing was discussed today.   Review of Systems  Constitutional: Negative for chills and fever.  HENT: Negative for congestion and ear  pain.   Eyes: Positive for redness. Negative for blurred vision, double vision, photophobia, pain and discharge.  Respiratory: Negative for cough and shortness of breath.   Cardiovascular: Negative for chest pain, palpitations and leg swelling.  Gastrointestinal: Negative for abdominal pain, blood in stool, constipation, diarrhea, nausea and vomiting.  Genitourinary: Negative for dysuria.  Musculoskeletal: Negative for falls and myalgias.  Skin: Negative for rash.  Neurological: Negative for dizziness.  Psychiatric/Behavioral: Negative for depression. The patient is not nervous/anxious.       Past Medical History:  Diagnosis Date  . AR (allergic rhinitis)   . Asthma   . Barrett esophagus   . Breast cancer ([place])    hx; s/p masectomy 1994  . Esophageal dysmotility    severe; has been elevated by [facility]ology at [facility] but no connective tissue disease diagnosis was made. was ANA positive but anti-SCL 7- negative . 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  Marland Kitchen GERD (gastroesophageal reflux disease)    24 hr pH study did not show significant acid refulux  . HLD (hyperlipidemia)    elevated triglycerides  . Impaired fasting glucose   . Migraine headache   . Osteoarthritis   . Paroxysmal SVT (supraventricular tachycardia) (HCC)    hx; 5 beat run noted on holter monitor in 2005. EF 65%  . PKD (polycystic kidney disease)     reports that she has quit smoking. She has never used smokeless tobacco. She reports current alcohol use. She reports that she does not use drugs.   Current Outpatient Medications:  .  diphenhydramine-acetaminophen (TYLENOL PM) 25-500 MG TABS, Take 1 tablet by mouth at bedtime as needed., Disp: , Rfl:  .  fenofibrate (TRICOR) 145 MG tablet, TAKE ONE TABLET EVERY DAY, Disp: 90 tablet, Rfl: 0 .  fexofenadine (ALLEGRA) 60 MG tablet, Take 60 mg by mouth daily. , Disp: , Rfl:  .  fluticasone (FLONASE) 50 MCG/ACT nasal spray, Place 2 sprays into both nostrils daily.,  Disp: , Rfl:  .  hyoscyamine (ANASPAZ) 0.125 MG TBDP, Place 0.125 mg under the tongue as needed.  , Disp: , Rfl:  .  Multiple Vitamins-Minerals (MULTIVITAMIN ADULT PO), Take by mouth., Disp: , Rfl:  .  OMEPRAZOLE PO, Take 20 mg by mouth 2 (two) times daily., Disp: , Rfl:  .  OVER THE COUNTER MEDICATION, Micro Plex MVP one tablet by mouth daily, Disp: , Rfl:  .  Probiotic Product (PROBIOTIC-10 PO), Take by mouth., Disp: , Rfl:    Observations/Objective: Blood pressure 124/75, pulse 87, height 5' 2.5" (1.588 m), weight 150 lb (68 kg).  Physical Exam  Physical Exam Constitutional:      General: She is not in acute distress. EYE:  Left eye: clear conjunctiva, EOMI Right eye: bloody conjunctiva in lower ey, no appearance of inflammation, EOMI, pupils equal bialterally Pulmonary:     Effort: Pulmonary effort is normal. No respiratory distress.  Neurological:     Mental Status: She is alert and oriented to person, place, and time.  Psychiatric:        Mood and Affect: Mood normal.        Behavior: Behavior normal.   Assessment and Plan   Conjunctival hemorrhage of right eye No red flags. Even with limited exam, history and appearance very typical for conjunctiva hemorrhage.  Pt reassured. No treatment necessary. If eye pain or vision change starts she will call her eye MD ASAP.   I discussed the assessment and treatment plan with the patient. The patient was provided an opportunity to ask questions and all were answered. The patient agreed with the plan and demonstrated an understanding of the instructions.   The patient was advised to call back or seek an in-person evaluation if the symptoms worsen or if the condition fails to improve as anticipated.     Eliezer Lofts, MD

## 2018-06-13 NOTE — Assessment & Plan Note (Signed)
No red flags. Even with limited exam, history and appearance very typical for conjunctiva hemorrhage.  Pt reassured. No treatment necessary. If eye pain or vision change starts she will call her eye MD ASAP.

## 2018-06-13 NOTE — Patient Instructions (Signed)

## 2018-07-19 ENCOUNTER — Telehealth: Payer: Self-pay | Admitting: Family Medicine

## 2018-07-19 DIAGNOSIS — E782 Mixed hyperlipidemia: Secondary | ICD-10-CM

## 2018-07-19 NOTE — Telephone Encounter (Signed)
-----   Message from Cloyd Stagers, RT sent at 07/18/2018 11:43 AM EDT ----- Regarding: Lab Orders for Wednesday 5.20.2020 Please place lab orders for Wednesday 5.20.2020, Doxy.me visit on Friday 5.22.2020 Thank you, Dyke Maes RT(R)

## 2018-07-23 ENCOUNTER — Ambulatory Visit (INDEPENDENT_AMBULATORY_CARE_PROVIDER_SITE_OTHER): Payer: Medicare Other

## 2018-07-23 ENCOUNTER — Other Ambulatory Visit: Payer: Self-pay | Admitting: Family Medicine

## 2018-07-23 DIAGNOSIS — Z1231 Encounter for screening mammogram for malignant neoplasm of breast: Secondary | ICD-10-CM

## 2018-07-23 DIAGNOSIS — Z0000 Encounter for general adult medical examination without abnormal findings: Secondary | ICD-10-CM | POA: Diagnosis not present

## 2018-07-23 NOTE — Progress Notes (Signed)
I reviewed health advisor's note, was available for consultation, and agree with documentation and plan.  

## 2018-07-23 NOTE — Patient Instructions (Addendum)
Sydney Ortiz , Thank you for taking time to come for your Medicare Wellness Visit. I appreciate your ongoing commitment to your health goals. Please review the following plan we discussed and let me know if I can assist you in the future.   These are the goals we discussed: Goals    . Patient Stated     Starting 07/23/18, I will continue to walk at least 7000 steps daily.        This is a list of the screening recommended for you and due dates:  Health Maintenance  Topic Date Due  . Mammogram  03/06/2019*  . Flu Shot  10/05/2018  . Tetanus Vaccine  08/05/2022  . Colon Cancer Screening  09/10/2024  . DEXA scan (bone density measurement)  Completed  .  Hepatitis C: One time screening is recommended by Center for Disease Control  (CDC) for  adults born from [birth year] through 1965.   Completed  . Pneumonia vaccines  Completed  *Topic was postponed. The date shown is not the original due date.   Preventive Care for Adults  A healthy lifestyle and preventive care can promote health and wellness. Preventive health guidelines for adults include the following key practices.  . A routine yearly physical is a good way to check with your health care provider about your health and preventive screening. It is a chance to share any concerns and updates on your health and to receive a thorough exam.  . Visit your dentist for a routine exam and preventive care every 6 months. Brush your teeth twice a day and floss once a day. Good oral hygiene prevents tooth decay and gum disease.  . The frequency of eye exams is based on your age, health, family medical history, use  of contact lenses, and other factors. Follow your health care provider's recommendations for frequency of eye exams.  . Eat a healthy diet. Foods like vegetables, fruits, whole grains, low-fat dairy products, and lean protein foods contain the nutrients you need without too many calories. Decrease your intake of foods high in solid fats, added  sugars, and salt. Eat the right amount of calories for you. Get information about a proper diet from your health care provider, if necessary.  . Regular physical exercise is one of the most important things you can do for your health. Most adults should get at least 150 minutes of moderate-intensity exercise (any activity that increases your heart rate and causes you to sweat) each week. In addition, most adults need muscle-strengthening exercises on 2 or more days a week.  Silver Sneakers may be a benefit available to you. To determine eligibility, you may visit the website: www.silversneakers.com or contact program at 306-513-5597 Mon-Fri between 8AM-8PM.   . Maintain a healthy weight. The body mass index (BMI) is a screening tool to identify possible weight problems. It provides an estimate of body fat based on height and weight. Your health care provider can find your BMI and can help you achieve or maintain a healthy weight.   For adults 20 years and older: ? A BMI below 18.5 is considered underweight. ? A BMI of 18.5 to 24.9 is normal. ? A BMI of 25 to 29.9 is considered overweight. ? A BMI of 30 and above is considered obese.   . Maintain normal blood lipids and cholesterol levels by exercising and minimizing your intake of saturated fat. Eat a balanced diet with plenty of fruit and vegetables. Blood tests for lipids and cholesterol should  begin at age 27 and be repeated every 5 years. If your lipid or cholesterol levels are high, you are over 50, or you are at high risk for heart disease, you may need your cholesterol levels checked more frequently. Ongoing high lipid and cholesterol levels should be treated with medicines if diet and exercise are not working.  . If you smoke, find out from your health care provider how to quit. If you do not use tobacco, please do not start.  . If you choose to drink alcohol, please do not consume more than 2 drinks per day. One drink is considered to  be 12 ounces (355 mL) of beer, 5 ounces (148 mL) of wine, or 1.5 ounces (44 mL) of liquor.  . If you are 39-75 years old, ask your health care provider if you should take aspirin to prevent strokes.  . Use sunscreen. Apply sunscreen liberally and repeatedly throughout the day. You should seek shade when your shadow is shorter than you. Protect yourself by wearing long sleeves, pants, a wide-brimmed hat, and sunglasses year round, whenever you are outdoors.  . Once a month, do a whole body skin exam, using a mirror to look at the skin on your back. Tell your health care provider of new moles, moles that have irregular borders, moles that are larger than a pencil eraser, or moles that have changed in shape or color.

## 2018-07-23 NOTE — Progress Notes (Signed)
PCP notes:   Health maintenance:  Mammogram - addressed  Abnormal screenings:   None  Patient concerns:   Right arm pain - 3/10  Nurse concerns:  None  Next PCP appt:   07/26/18 @ 1000

## 2018-07-23 NOTE — Progress Notes (Signed)
Subjective:   Sydney Ortiz is a 73 y.o. female who presents for Medicare Annual (Subsequent) preventive examination.  Review of Systems:  N/A Cardiac Risk Factors include: advanced age (>57men, >62 women);dyslipidemia     Objective:     Vitals: There were no vitals taken for this visit.  There is no height or weight on file to calculate BMI.  Advanced Directives 07/23/2018 07/10/2017 04/08/2017 05/23/2016  Does Patient Have a Medical Advance Directive? Yes Yes No Yes  Type of Paramedic of [place];Living will [place];Living will - [place];Living will  Does patient want to make changes to medical advance directive? No - Patient declined - - -  Copy of [place] in Chart? Yes - validated most recent copy scanned in chart (See row information) Yes - No - copy requested  Would patient like information on creating a medical advance directive? - - No - Patient declined -    Tobacco Social History   Tobacco Use  Smoking Status Former Smoker  Smokeless Tobacco Never Used     Counseling given: No   Clinical Intake:  Pre-visit preparation completed: Yes  Pain : No/denies pain Pain Score: 3      Nutritional Status: BMI 25 -29 Overweight Nutritional Risks: None Diabetes: No  How often do you need to have someone help you when you read instructions, pamphlets, or other written materials from your doctor or pharmacy?: 1 - Never What is the last grade level you completed in school?: Bachelor degree  Interpreter Needed?: No  Comments: pt is a widow and 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 AR (allergic rhinitis)   . Asthma   . Barrett esophagus   . Breast cancer ([place])    hx; s/p masectomy 1994  . Esophageal dysmotility    severe; has been elevated by [facility]ology at [facility] but no connective tissue disease diagnosis was made. was  ANA positive but anti-SCL 7- negative . 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  Marland Kitchen GERD (gastroesophageal reflux disease)    24 hr pH study did not show significant acid refulux  . HLD (hyperlipidemia)    elevated triglycerides  . Impaired fasting glucose   . Migraine headache   . Osteoarthritis   . Paroxysmal SVT (supraventricular tachycardia) (HCC)    hx; 5 beat run noted on holter monitor in 2005. EF 65%  . PKD (polycystic kidney disease)    Past Surgical History:  Procedure Laterality Date  . abt CT test  6/07   bilateral cysts, larget on R 9.6 x 6.7 cm  . BREAST RECONSTRUCTION  9/11  . colonoscopy/EGD  2005  . holter monitor  1/05   PVC, PACs, SVT  . kidney surgery - cyst removal  3/06  . MASTECTOMY Right   . PARTIAL HYSTERECTOMY  1983   due to fibroids  . radial masectomy  1993   R side  . right carpal tunnel  7/11  . rotator cuff, right  2004  . UPPER GI ENDOSCOPY  09/2016   Family History  Problem Relation Age of Onset  . Arthritis Father   . Prostate cancer Father   . Hypertension Father   . Polycystic kidney disease Mother   . CVA Mother   . Dementia Mother   . Heart Problems Mother        valve disease  . Diabetes Other  DM - MU and MA   . Heart attack Brother   . Hypertension Brother   . Ulcers Brother    Social History   Socioeconomic History  . Marital status: Widowed    Spouse name: Not on file  . Number of children: Not on file  . Years of education: Not on file  . Highest education level: Not on file  Occupational History  . Not on file  Social Needs  . Financial resource strain: Not on file  . Food insecurity:    Worry: Not on file    Inability: Not on file  . Transportation needs:    Medical: Not on file    Non-medical: Not on file  Tobacco Use  . Smoking status: Former Research scientist (life sciences)  . Smokeless tobacco: Never Used  Substance and Sexual Activity  . Alcohol use: Yes    Comment: occasionally  . Drug use: No  . Sexual  activity: Yes  Lifestyle  . Physical activity:    Days per week: Not on file    Minutes per session: Not on file  . Stress: Not on file  Relationships  . Social connections:    Talks on phone: Not on file    Gets together: Not on file    Attends religious service: Not on file    Active member of club or organization: Not on file    Attends meetings of clubs or organizations: Not on file    Relationship status: Not on file  Other Topics Concern  . Not on file  Social History Narrative   Married, 2 adopted children (1B, 1G)   Originally from [place]; moved from [place] to [place].    Regular exercise - treadmill, 2 miles 3x/week; 4 miles daily    Retired - [employer]   Diet: avoids MSG (triggers HA), cultured cheese, coloring.       Pt signed designated party release form and gives Sydney Ortiz, spouse [phone] and, and son Sydney Ortiz, [phone], access to medical records. Can leave msg on answering machine [phone] and on cell [phone].      Has living will, [place] husband, full code ( reviewed 2014)    Outpatient Encounter Medications as of 07/23/2018  Medication Sig  . diphenhydramine-acetaminophen (TYLENOL PM) 25-500 MG TABS Take 1 tablet by mouth at bedtime as needed.  . fenofibrate (TRICOR) 145 MG tablet TAKE ONE TABLET EVERY DAY  . fexofenadine (ALLEGRA) 60 MG tablet Take 60 mg by mouth daily.   . fluticasone (FLONASE) 50 MCG/ACT nasal spray Place 2 sprays into both nostrils daily.  . hyoscyamine (ANASPAZ) 0.125 MG TBDP Place 0.125 mg under the tongue as needed.    . Multiple Vitamins-Minerals (MULTIVITAMIN ADULT PO) Take by mouth.  . OMEPRAZOLE PO Take 20 mg by mouth 2 (two) times daily.  Marland Kitchen OVER THE COUNTER MEDICATION Micro Plex MVP one tablet by mouth daily  . Probiotic Product (PROBIOTIC-10 PO) Take by mouth.   No facility-administered encounter medications on file as of 07/23/2018.     Activities of Daily Living In your present state of health, do you have any difficulty  performing the following activities: 07/23/2018  Hearing? Y  Vision? N  Difficulty concentrating or making decisions? N  Walking or climbing stairs? N  Dressing or bathing? N  Doing errands, shopping? N  Preparing Food and eating ? N  Using the Toilet? N  In the past six months, have you accidently leaked urine? N  Do you have problems with  loss of bowel control? N  Managing your Medications? N  Managing your Finances? N  Housekeeping or managing your Housekeeping? N  Some recent data might be hidden    Patient Care Team: Jinny Sanders, MD as PCP - General Thelma Comp, [place] as Consulting Physician (Optometry) Newell Coral., MD as Referring Physician (Gastroenterology) Donato Heinz, MD as Consulting Physician (Nephrology)    Assessment:   This is a routine wellness examination for Sydney Ortiz.  Hearing Screening Comments: Bilateral hearing aids Vision Screening Comments: Vision exam in 2019 with Dr. [name]   Exercise Activities and Dietary recommendations Current Exercise Habits: Home exercise routine, Type of exercise: walking, Time (Minutes): 45(7000 steps daily), Frequency (Times/Week): 7, Weekly Exercise (Minutes/Week): 315, Intensity: Mild, Exercise limited by: None identified  Goals    . Patient Stated     Starting 07/23/18, I will continue to walk at least 7000 steps daily.        Fall Risk Fall Risk  07/23/2018 07/10/2017 05/23/2016 05/28/2015 04/23/2014  Falls in the past year? 0 No No Yes No  Number falls in past yr: - - - 1 -  Injury with Fall? - - - Yes -   Depression Screen PHQ 2/9 Scores 07/23/2018 07/10/2017 05/23/2016 05/28/2015  PHQ - 2 Score 0 0 0 0  PHQ- 9 Score 0 0 - -     Cognitive Function MMSE - Mini Mental State Exam 07/23/2018 07/10/2017 05/23/2016  Orientation to time 5 5 5   Orientation to Place 5 5 5   Registration 3 3 3   Attention/ Calculation 0 0 0  Recall 3 3 2   Recall-comments - - pt was unable to recall 1 of 3 words  Language-  name 2 objects 0 0 0  Language- repeat 1 1 1   Language- follow 3 step command 0 3 3  Language- read & follow direction 0 0 0  Write a sentence 0 0 0  Copy design 0 0 0  Total score 17 20 19      PLEASE NOTE: A Mini-Cog screen was completed. Maximum score is 17. A value of 0 denotes this part of [place] MMSE was not completed or the patient failed this part of the Mini-Cog screening.   Mini-Cog Screening Orientation to Time - Max 5 pts Orientation to Place - Max 5 pts Registration - Max 3 pts Recall - Max 3 pts Language Repeat - Max 1 pts      Immunization History  Administered Date(s) Administered  . Influenza Split 11/21/2010, 12/13/2011  . Influenza Whole 01/04/2007, 12/02/2007, 12/02/2008, 12/29/2009  . Influenza, High Dose Seasonal PF 12/14/2015, 12/04/2016  . Influenza,inj,Quad PF,6+ Mos 12/11/2012, 01/21/2014, 12/19/2017  . Influenza-Unspecified 01/01/2015  . Pneumococcal Conjugate-13 02/09/2016  . Pneumococcal Polysaccharide-23 06/04/2005, 12/11/2012  . Td 01/05/2004  . Tdap 08/04/2012  . Zoster 07/16/2009  . Zoster Recombinat (Shingrix) 01/07/2018    Screening Tests Health Maintenance  Topic Date Due  . MAMMOGRAM  03/06/2019 (Originally 04/10/2018)  . INFLUENZA VACCINE  10/05/2018  . TETANUS/TDAP  08/05/2022  . COLONOSCOPY  09/10/2024  . DEXA SCAN  Completed  . Hepatitis C Screening  Completed  . PNA vac Low Risk Adult  Completed      Plan:     I have personally reviewed, addressed, and noted the following in the patient's chart:  A. Medical and social history B. Use of alcohol, tobacco or illicit drugs  C. Current medications and supplements D. Functional ability and status E.  Nutritional status F.  Physical activity G. Advance directives H. List of other physicians I.  Hospitalizations, surgeries, and ER visits in previous 12 months J.  Vitals (unless it is a telemedicine encounter) K. Screenings to include cognitive, depression, hearing, vision  (NOTE: hearing and vision screenings not completed in telemedicine encounter) L. Referrals and appointments   In addition, I have reviewed and discussed with patient certain preventive protocols, quality metrics, and best practice recommendations. A written personalized care plan for preventive services and recommendations were provided to patient.  With patient's permission, we connected on 07/23/18 at  8:00 AM EDT by a video enabled telemedicine application. Two patient identifiers were used to ensure the encounter occurred with the correct person.    Patient was in home and writer was in office.   Signed,   Lindell Noe, MHA, BS, LPN Health Coach

## 2018-07-24 ENCOUNTER — Other Ambulatory Visit: Payer: Self-pay

## 2018-07-24 ENCOUNTER — Other Ambulatory Visit (INDEPENDENT_AMBULATORY_CARE_PROVIDER_SITE_OTHER): Payer: Medicare Other

## 2018-07-24 DIAGNOSIS — E782 Mixed hyperlipidemia: Secondary | ICD-10-CM

## 2018-07-24 LAB — COMPREHENSIVE METABOLIC PANEL
ALT: 26 U/L (ref 0–35)
AST: 25 U/L (ref 0–37)
Albumin: 4.4 g/dL (ref 3.5–5.2)
Alkaline Phosphatase: 48 U/L (ref 39–117)
BUN: 16 mg/dL (ref 6–23)
CO2: 28 mEq/L (ref 19–32)
Calcium: 9.9 mg/dL (ref 8.4–10.5)
Chloride: 104 mEq/L (ref 96–112)
Creatinine, Ser: 0.87 mg/dL (ref 0.40–1.20)
GFR: 63.79 mL/min (ref >60.00)
Glucose, Bld: 102 mg/dL — ABNORMAL HIGH (ref 70–99)
Potassium: 4.4 mEq/L (ref 3.5–5.1)
Sodium: 140 mEq/L (ref 135–145)
Total Bilirubin: 0.4 mg/dL (ref 0.2–1.2)
Total Protein: 6.9 g/dL (ref 6.0–8.3)

## 2018-07-24 LAB — LIPID PANEL
Cholesterol: 191 mg/dL (ref 0–200)
HDL: 71.3 mg/dL (ref >39.00)
LDL Cholesterol: 94 mg/dL (ref 0–99)
NonHDL: 119.56
Total CHOL/HDL Ratio: 3
Triglycerides: 128 mg/dL (ref 0.0–149.0)
VLDL: 25.6 mg/dL (ref 0.0–40.0)

## 2018-07-26 ENCOUNTER — Encounter: Payer: Self-pay | Admitting: Family Medicine

## 2018-07-26 ENCOUNTER — Encounter: Payer: Medicare Other | Admitting: Family Medicine

## 2018-07-26 ENCOUNTER — Ambulatory Visit (INDEPENDENT_AMBULATORY_CARE_PROVIDER_SITE_OTHER): Payer: Medicare Other | Admitting: Family Medicine

## 2018-07-26 VITALS — Temp 98.6°F | Ht 62.5 in | Wt 150.0 lb

## 2018-07-26 DIAGNOSIS — Q613 Polycystic kidney, unspecified: Secondary | ICD-10-CM

## 2018-07-26 DIAGNOSIS — E782 Mixed hyperlipidemia: Secondary | ICD-10-CM

## 2018-07-26 DIAGNOSIS — J4520 Mild intermittent asthma, uncomplicated: Secondary | ICD-10-CM

## 2018-07-26 DIAGNOSIS — K22719 Barrett's esophagus with dysplasia, unspecified: Secondary | ICD-10-CM | POA: Diagnosis not present

## 2018-07-26 DIAGNOSIS — H1131 Conjunctival hemorrhage, right eye: Secondary | ICD-10-CM

## 2018-07-26 DIAGNOSIS — M79601 Pain in right arm: Secondary | ICD-10-CM

## 2018-07-26 MED ORDER — PREDNISONE 20 MG PO TABS: ORAL_TABLET | ORAL | 0 refills | Status: DC

## 2018-07-26 MED ORDER — FENOFIBRATE 145 MG PO TABS: 145.0000 mg | ORAL_TABLET | Freq: Every day | ORAL | 3 refills | Status: DC

## 2018-07-26 NOTE — Assessment & Plan Note (Signed)
Stable followed by GI. 

## 2018-07-26 NOTE — Patient Instructions (Signed)
Stop aleve.  Start a course of prednisone for right shoulder bursitis and right lateral epicondylitis. Start home physical therapy...we will mail to you.  If not improving .. can for in office visit with Sports Medicine.. Dr. Lorelei Pont for possible injection.

## 2018-07-26 NOTE — Assessment & Plan Note (Signed)
Stop aleve.  Start a course of prednisone for right shoulder bursitis and right lateral epicondylitis. Start home physical therapy...we will mail to you.  If not improving .. can for in office visit with Sports Medicine.. Dr. Lorelei Pont for possible injection.

## 2018-07-26 NOTE — Assessment & Plan Note (Signed)
Well controlled. Continue current medication.  

## 2018-07-26 NOTE — Progress Notes (Signed)
Tennis Elbow & Shoulder Bursitis exercises mailed to Sydney Ortiz.

## 2018-07-26 NOTE — Assessment & Plan Note (Addendum)
Stable , minimal issue

## 2018-07-26 NOTE — Assessment & Plan Note (Signed)
Followed by Dr. Zara Chess, stable Estimated Creatinine Clearance: 52.7 mL/min (by C-G formula based on SCr of 0.87 mg/dL).

## 2018-07-26 NOTE — Progress Notes (Signed)
VIRTUAL VISIT Due to national recommendations of social distancing due to [place] 19, a virtual visit is felt to be most appropriate for this patient at this time.   I connected with the patient on 07/26/18 at 10:00 AM EDT by virtual telehealth platform and verified that I am speaking with the correct person using two identifiers.   I discussed the limitations, risks, security and privacy concerns of performing an evaluation and management service by  virtual telehealth platform and the availability of in person appointments. I also discussed with the patient that there may be a patient responsible charge related to this service. The patient expressed understanding and agreed to proceed.  Patient location: Home Provider Location:  [facility] Participants: Eliezer Lofts and Earl Many   Chief Complaint  Patient presents with  . Annual Exam    MCW Part 2 saw Katha Cabal 07-23-18  . Pain    History of Present Illness: The patient presents for  review of chronic health problems. He/She also has the following acute concerns today: right arm pain  The patient saw Candis Musa, LPN for medicare wellness. Note reviewed in detail and important notes copied below. Health maintenance: Mammogram - addressed Abnormal screenings:  None Patient concerns:  Right arm pain - 3/10  07/26/18 Right arm pain, right shoulder, right elbow and right hand: Stopped budesonide prior to the pain starting. Took 2 aleve and pain improved significantly. She is trying to do home exercises but unable to do regular rehab at pool.  Hx of rotator cuff surgery many years ago on right shoulder.  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 Stable Cr  GRF 63  Barretts esophagitis: Stable endoscopy 10/2017 on omeprazole. Dr. Clydene Laming.. plan repeat in 3 years  Microscopic colitis, causing chronic diarrhea:Very well controlled, now gluten free... Has  stopped budesonide, followed by GI.  Elevated Cholesterol:  Stable control on tricor Lab Results  Component Value Date   CHOL 191 07/24/2018   HDL 71.30 07/24/2018   LDLCALC 94 07/24/2018   LDLDIRECT 130.8 11/15/2009   TRIG 128.0 07/24/2018   CHOLHDL 3 07/24/2018  Using medications without problems: Muscle aches:  Diet compliance:moderate Exercise: walking every few days Other complaints: Wt Readings from Last 3 Encounters:  07/26/18 150 lb (68 kg)  06/13/18 150 lb (68 kg)  03/22/18 151 lb 8 oz (68.7 kg)     COVID 19 screen No recent travel or known exposure to [place] The patient denies respiratory symptoms of COVID 19 at this time.  The importance of social distancing was discussed today.   Review of Systems  Constitutional: Negative for chills and fever.  HENT: Negative for congestion and ear pain.   Eyes: Negative for pain and redness.  Respiratory: Negative for cough and shortness of breath.   Cardiovascular: Negative for chest pain, palpitations and leg swelling.  Gastrointestinal: Negative for abdominal pain, blood in stool, constipation, diarrhea, nausea and vomiting.  Genitourinary: Negative for dysuria.  Musculoskeletal: Negative for falls and myalgias.  Skin: Negative for rash.  Neurological: Negative for dizziness.  Psychiatric/Behavioral: Negative for depression. The patient is not nervous/anxious.       Past Medical History:  Diagnosis Date  . AR (allergic rhinitis)   . Asthma   . Barrett esophagus   . Breast cancer ([place])    hx; s/p masectomy 1994  . Esophageal dysmotility    severe; has been elevated by [facility]ology at [facility] but no connective tissue disease diagnosis  was made. was ANA positive but anti-SCL 7- negative . 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  Marland Kitchen GERD (gastroesophageal reflux disease)    24 hr pH study did not show significant acid refulux  . HLD (hyperlipidemia)    elevated triglycerides  . Impaired fasting glucose   .  Migraine headache   . Osteoarthritis   . Paroxysmal SVT (supraventricular tachycardia) (HCC)    hx; 5 beat run noted on holter monitor in 2005. EF 65%  . PKD (polycystic kidney disease)     reports that she has quit smoking. She has never used smokeless tobacco. She reports current alcohol use. She reports that she does not use drugs.   Current Outpatient Medications:  .  diphenhydramine-acetaminophen (TYLENOL PM) 25-500 MG TABS, Take 1 tablet by mouth at bedtime as needed., Disp: , Rfl:  .  fenofibrate (TRICOR) 145 MG tablet, TAKE ONE TABLET EVERY DAY, Disp: 90 tablet, Rfl: 0 .  fexofenadine (ALLEGRA) 60 MG tablet, Take 60 mg by mouth daily. , Disp: , Rfl:  .  fluticasone (FLONASE) 50 MCG/ACT nasal spray, Place 2 sprays into both nostrils daily., Disp: , Rfl:  .  hyoscyamine (ANASPAZ) 0.125 MG TBDP, Place 0.125 mg under the tongue as needed.  , Disp: , Rfl:  .  Multiple Vitamins-Minerals (MULTIVITAMIN ADULT PO), Take by mouth., Disp: , Rfl:  .  OMEPRAZOLE PO, Take 20 mg by mouth 2 (two) times daily., Disp: , Rfl:  .  OVER THE COUNTER MEDICATION, Micro Plex MVP one tablet by mouth daily, Disp: , Rfl:  .  Probiotic Product (PROBIOTIC-10 PO), Take by mouth., Disp: , Rfl:    Observations/Objective: Temperature 98.6 F (37 C), height 5' 2.5" (1.588 m), weight 150 lb (68 kg).  Physical Exam  Physical Exam Constitutional:      General: The patient is not in acute distress. Pulmonary:     Effort: Pulmonary effort is normal. No respiratory distress.  Neurological:     Mental Status: The patient is alert and oriented to person, place, and time.  Psychiatric:        Mood and Affect: Mood normal.        Behavior: Behavior normal.   Assessment and Plan The patient's preventative maintenance and recommended screening tests for an annual wellness exam were reviewed in full today. Brought up to date unless services declined.  Counselled on the importance of diet, exercise, and its role in  overall health and mortality. The patient's FH and SH was reviewed, including their home life, tobacco status, and drug and alcohol status.   Mammo/breast exam: personal history of breast cancer: stable2/2019.Marland Kitchen pending repeat in 08/2018 Vaccines: Uptodate with PNA, flu, shingles, TDap, prevnar. DVE/pap:partial hysterectomy, one ovary remains. DVEnot indicated, no pap indicated.  Colon: 09/2014 bx microscopic colitis, repeat in 10 years Dr. Clydene Laming at [facility]. DXA: 08/27/2017 normal.. repeat in 5 years Former smoker minimal use. Hep C negative  Mild intermittent asthma Stable , minimal issue  Polycystic kidney disease Followed by Dr. Zara Chess, stable Estimated Creatinine Clearance: 52.7 mL/min (by C-G formula based on SCr of 0.87 mg/dL).   Hyperlipidemia Well controlled. Continue current medication.   Barrett's esophagus Stable followed by GI.  Right arm pain Stop aleve.  Start a course of prednisone for right shoulder bursitis and right lateral epicondylitis. Start home physical therapy...we will mail to you.  If not improving .. can for in office visit with Sports Medicine.. Dr. Lorelei Pont for possible injection.      I discussed the assessment  and treatment plan with the patient. The patient was provided an opportunity to ask questions and all were answered. The patient agreed with the plan and demonstrated an understanding of the instructions.   The patient was advised to call back or seek an in-person evaluation if the symptoms worsen or if the condition fails to improve as anticipated.     Eliezer Lofts, MD

## 2018-07-31 NOTE — Progress Notes (Signed)
Medicare wellness 5/24 cpx medicare 5/28 Pt aware

## 2018-08-17 ENCOUNTER — Ambulatory Visit
Admission: RE | Admit: 2018-08-17 | Discharge: 2018-08-17 | Disposition: A | Discharge status: Home / Self Care | Payer: Medicare Other | Source: Ambulatory Visit | Attending: Family Medicine | Admitting: Family Medicine

## 2018-08-17 ENCOUNTER — Other Ambulatory Visit: Payer: Self-pay

## 2018-08-17 DIAGNOSIS — Z1231 Encounter for screening mammogram for malignant neoplasm of breast: Secondary | ICD-10-CM

## 2018-08-20 ENCOUNTER — Other Ambulatory Visit: Payer: Self-pay | Admitting: *Deleted

## 2018-08-29 NOTE — Telephone Encounter (Signed)
Pt left VM at Triage requesting Butch Penny or Dr. Diona Browner to f/u with her regarding her mychart message

## 2018-08-29 NOTE — Telephone Encounter (Signed)
Dr. Lorelei Pont can you review this for patient or does it need to wait for Dr. Diona Browner? Thank you

## 2018-08-29 NOTE — Telephone Encounter (Signed)
I don't think this is urgent, and I would prefer Dr. Rometta Emery input.

## 2018-09-12 ENCOUNTER — Ambulatory Visit: Payer: Medicare Other

## 2018-10-25 DIAGNOSIS — Z20822 Contact with and (suspected) exposure to covid-19: Secondary | ICD-10-CM

## 2018-10-25 DIAGNOSIS — Z20828 Contact with and (suspected) exposure to other viral communicable diseases: Secondary | ICD-10-CM

## 2018-11-04 ENCOUNTER — Other Ambulatory Visit: Payer: Self-pay

## 2018-11-04 DIAGNOSIS — Z20822 Contact with and (suspected) exposure to covid-19: Secondary | ICD-10-CM

## 2018-11-06 LAB — NOVEL CORONAVIRUS, NAA: SARS-CoV-2, NAA: NOT DETECTED

## 2018-11-12 ENCOUNTER — Ambulatory Visit (INDEPENDENT_AMBULATORY_CARE_PROVIDER_SITE_OTHER): Payer: Medicare Other

## 2018-11-12 DIAGNOSIS — Z23 Encounter for immunization: Secondary | ICD-10-CM | POA: Diagnosis not present

## 2019-02-26 LAB — HM DIABETES EYE EXAM

## 2019-03-10 ENCOUNTER — Encounter: Payer: Self-pay | Admitting: Family Medicine

## 2019-07-17 ENCOUNTER — Other Ambulatory Visit: Payer: Self-pay | Admitting: Family Medicine

## 2019-07-17 DIAGNOSIS — Z1231 Encounter for screening mammogram for malignant neoplasm of breast: Secondary | ICD-10-CM

## 2019-07-24 ENCOUNTER — Telehealth: Payer: Self-pay | Admitting: Family Medicine

## 2019-07-24 DIAGNOSIS — E782 Mixed hyperlipidemia: Secondary | ICD-10-CM

## 2019-07-24 NOTE — Telephone Encounter (Signed)
-----   Message from Cloyd Stagers, RT sent at 07/22/2019  9:11 AM EDT ----- Regarding: Lab Orders for Friday 5.21.2021 Please place lab orders for Friday 5.21.2021, office visit for physical on Friday 5.28.2021 Thank you, Dyke Maes RT(R)

## 2019-07-25 ENCOUNTER — Ambulatory Visit (INDEPENDENT_AMBULATORY_CARE_PROVIDER_SITE_OTHER): Payer: Medicare Other

## 2019-07-25 ENCOUNTER — Other Ambulatory Visit (INDEPENDENT_AMBULATORY_CARE_PROVIDER_SITE_OTHER): Payer: Medicare Other

## 2019-07-25 ENCOUNTER — Telehealth: Payer: Self-pay

## 2019-07-25 DIAGNOSIS — Z0000 Encounter for general adult medical examination without abnormal findings: Secondary | ICD-10-CM | POA: Diagnosis not present

## 2019-07-25 DIAGNOSIS — E782 Mixed hyperlipidemia: Secondary | ICD-10-CM

## 2019-07-25 LAB — COMPREHENSIVE METABOLIC PANEL
ALT: 21 U/L (ref 0–35)
AST: 20 U/L (ref 0–37)
Albumin: 4.3 g/dL (ref 3.5–5.2)
Alkaline Phosphatase: 51 U/L (ref 39–117)
BUN: 17 mg/dL (ref 6–23)
CO2: 28 mEq/L (ref 19–32)
Calcium: 9.4 mg/dL (ref 8.4–10.5)
Chloride: 107 mEq/L (ref 96–112)
Creatinine, Ser: 0.88 mg/dL (ref 0.40–1.20)
GFR: 62.78 mL/min (ref >60.00)
Glucose, Bld: 94 mg/dL (ref 70–99)
Potassium: 4 mEq/L (ref 3.5–5.1)
Sodium: 140 mEq/L (ref 135–145)
Total Bilirubin: 0.3 mg/dL (ref 0.2–1.2)
Total Protein: 6.7 g/dL (ref 6.0–8.3)

## 2019-07-25 LAB — LIPID PANEL
Cholesterol: 177 mg/dL (ref 0–200)
HDL: 57.1 mg/dL (ref >39.00)
LDL Cholesterol: 99 mg/dL (ref 0–99)
NonHDL: 119.63
Total CHOL/HDL Ratio: 3
Triglycerides: 105 mg/dL (ref 0.0–149.0)
VLDL: 21 mg/dL (ref 0.0–40.0)

## 2019-07-25 NOTE — Telephone Encounter (Signed)
[facility] Night - Client Nonclinical Telephone Record [facility]urse Client [facility] Night - Client Client Site [place] - Night Physician Eliezer Lofts - MD Contact Type Call Who Is Calling Patient / Member / Family / Caregiver Caller Name Reeanna Trusty Caller Phone Number [phone] or [phone] Call Type Message Only Information Provided Reason for Call Returning a Call from the Office Initial [place] states she received a call from the office. Caller has lab work tomorrow as well as a tele- health appointment tomorrow. Additional Comment Caller is returning a call from the office. Please contact Emory Prashad on either [phone] or [phone] Disp. Time Disposition Final User 07/24/2019 5:09:08 PM General Information Provided Yes [place], Berdi Call Closed By: Bonnita Nasuti Transaction Date/Time: 07/24/2019 5:03:14 PM (ET)

## 2019-07-25 NOTE — Progress Notes (Signed)
PCP notes:  Health Maintenance: No gaps noted   Abnormal Screenings: none   Patient concerns: Arthritis pain    Nurse concerns: none   Next PCP appt.: 08/01/2019 @ 10 am

## 2019-07-25 NOTE — Progress Notes (Signed)
No critical labs need to be addressed urgently. We will discuss labs in detail at upcoming office visit.   

## 2019-07-25 NOTE — Patient Instructions (Signed)
Ms. Sydney Ortiz , Thank you for taking time to come for your Medicare Wellness Visit. I appreciate your ongoing commitment to your health goals. Please review the following plan we discussed and let me know if I can assist you in the future.   Screening recommendations/referrals: Colonoscopy: Up to date, completed 09/11/2014 Mammogram: scheduled for 08/28/2019 Bone Density: Up to date, completed 08/27/2017 Recommended yearly ophthalmology/optometry visit for glaucoma screening and checkup Recommended yearly dental visit for hygiene and checkup  Vaccinations: Influenza vaccine: Up to date, completed 11/12/2018 Pneumococcal vaccine: Completed series Tdap vaccine: Up to date, completed 08/04/2012 Shingles vaccine: administered 01/07/2018    Advanced directives: copy in chart per patient   Conditions/risks identified: hyperlipidemia  Next appointment: 08/01/2019 @ 10 am    Preventive Care 65 Years and Older, Female Preventive care refers to lifestyle choices and visits with your health care provider that can promote health and wellness. What does preventive care include?  A yearly physical exam. This is also called an annual well check.  Dental exams once or twice a year.  Routine eye exams. Ask your health care provider how often you should have your eyes checked.  Personal lifestyle choices, including:  Daily care of your teeth and gums.  Regular physical activity.  Eating a healthy diet.  Avoiding tobacco and drug use.  Limiting alcohol use.  Practicing safe sex.  Taking low-dose aspirin every day.  Taking vitamin and mineral supplements as recommended by your health care provider. What happens during an annual well check? The services and screenings done by your health care provider during your annual well check will depend on your age, overall health, lifestyle risk factors, and family history of disease. Counseling  Your health care provider may ask you questions about  your:  Alcohol use.  Tobacco use.  Drug use.  Emotional well-being.  Home and relationship well-being.  Sexual activity.  Eating habits.  History of falls.  Memory and ability to understand (cognition).  Work and work Statistician.  Reproductive health. Screening  You may have the following tests or measurements:  Height, weight, and BMI.  Blood pressure.  Lipid and cholesterol levels. These may be checked every 5 years, or more frequently if you are over 63 years old.  Skin check.  Lung cancer screening. You may have this screening every year starting at age 36 if you have a 30-pack-year history of smoking and currently smoke or have quit within the past 15 years.  Fecal occult blood test (FOBT) of the stool. You may have this test every year starting at age 93.  Flexible sigmoidoscopy or colonoscopy. You may have a sigmoidoscopy every 5 years or a colonoscopy every 10 years starting at age 28.  Hepatitis C blood test.  Hepatitis B blood test.  Sexually transmitted disease (STD) testing.  Diabetes screening. This is done by checking your blood sugar (glucose) after you have not eaten for a while (fasting). You may have this done every 1-3 years.  Bone density scan. This is done to screen for osteoporosis. You may have this done starting at age 51.  Mammogram. This may be done every 1-2 years. Talk to your health care provider about how often you should have regular mammograms. Talk with your health care provider about your test results, treatment options, and if necessary, the need for more tests. Vaccines  Your health care provider may recommend certain vaccines, such as:  Influenza vaccine. This is recommended every year.  Tetanus, diphtheria, and acellular pertussis (  Tdap, Td) vaccine. You may need a Td booster every 10 years.  Zoster vaccine. You may need this after age 28.  Pneumococcal 13-valent conjugate (PCV13) vaccine. One dose is recommended  after age 70.  Pneumococcal polysaccharide (PPSV23) vaccine. One dose is recommended after age 53. Talk to your health care provider about which screenings and vaccines you need and how often you need them. This information is not intended to replace advice given to you by your health care provider. Make sure you discuss any questions you have with your health care provider. Document Released: 03/19/2015 Document Revised: 11/10/2015 Document Reviewed: 12/22/2014 Elsevier Interactive Patient Education  2017 [place] Prevention in the Home Falls can cause injuries. They can happen to people of all ages. There are many things you can do to make your home safe and to help prevent falls. What can I do on the outside of my home?  Regularly fix the edges of walkways and driveways and fix any cracks.  Remove anything that might make you trip as you walk through a door, such as a raised step or threshold.  Trim any bushes or trees on the path to your home.  Use bright outdoor lighting.  Clear any walking paths of anything that might make someone trip, such as rocks or tools.  Regularly check to see if handrails are loose or broken. Make sure that both sides of any steps have handrails.  Any raised decks and porches should have guardrails on the edges.  Have any leaves, snow, or ice cleared regularly.  Use sand or salt on walking paths during winter.  Clean up any spills in your garage right away. This includes oil or grease spills. What can I do in the bathroom?  Use night lights.  Install grab bars by the toilet and in the tub and shower. Do not use towel bars as grab bars.  Use non-skid mats or decals in the tub or shower.  If you need to sit down in the shower, use a plastic, non-slip stool.  Keep the floor dry. Clean up any water that spills on the floor as soon as it happens.  Remove soap buildup in the tub or shower regularly.  Attach bath mats securely with  double-sided non-slip rug tape.  Do not have throw rugs and other things on the floor that can make you trip. What can I do in the bedroom?  Use night lights.  Make sure that you have a light by your bed that is easy to reach.  Do not use any sheets or blankets that are too big for your bed. They should not hang down onto the floor.  Have a firm chair that has side arms. You can use this for support while you get dressed.  Do not have throw rugs and other things on the floor that can make you trip. What can I do in the kitchen?  Clean up any spills right away.  Avoid walking on wet floors.  Keep items that you use a lot in easy-to-reach places.  If you need to reach something above you, use a strong step stool that has a grab bar.  Keep electrical cords out of the way.  Do not use floor polish or wax that makes floors slippery. If you must use wax, use non-skid floor wax.  Do not have throw rugs and other things on the floor that can make you trip. What can I do with my stairs?  Do  not leave any items on the stairs.  Make sure that there are handrails on both sides of the stairs and use them. Fix handrails that are broken or loose. Make sure that handrails are as long as the stairways.  Check any carpeting to make sure that it is firmly attached to the stairs. Fix any carpet that is loose or worn.  Avoid having throw rugs at the top or bottom of the stairs. If you do have throw rugs, attach them to the floor with carpet tape.  Make sure that you have a light switch at the top of the stairs and the bottom of the stairs. If you do not have them, ask someone to add them for you. What else can I do to help prevent falls?  Wear shoes that:  Do not have high heels.  Have rubber bottoms.  Are comfortable and fit you well.  Are closed at the toe. Do not wear sandals.  If you use a stepladder:  Make sure that it is fully opened. Do not climb a closed stepladder.  Make  sure that both sides of the stepladder are locked into place.  Ask someone to hold it for you, if possible.  Clearly mark and make sure that you can see:  Any grab bars or handrails.  First and last steps.  Where the edge of each step is.  Use tools that help you move around (mobility aids) if they are needed. These include:  Canes.  Walkers.  Scooters.  Crutches.  Turn on the lights when you go into a dark area. Replace any light bulbs as soon as they burn out.  Set up your furniture so you have a clear path. Avoid moving your furniture around.  If any of your floors are uneven, fix them.  If there are any pets around you, be aware of where they are.  Review your medicines with your doctor. Some medicines can make you feel dizzy. This can increase your chance of falling. Ask your doctor what other things that you can do to help prevent falls. This information is not intended to replace advice given to you by your health care provider. Make sure you discuss any questions you have with your health care provider. Document Released: 12/17/2008 Document Revised: 07/29/2015 Document Reviewed: 03/27/2014 Elsevier Interactive Patient Education  2017 [employer].

## 2019-07-25 NOTE — Progress Notes (Signed)
Subjective:   Trulie Guard is a 74 y.o. female who presents for Medicare Annual (Subsequent) preventive examination.  Review of Systems: N/A   I connected with the patient today by telephone and verified that I am speaking with the correct person using two identifiers. Location patient: home Location nurse: work Persons participating in the virtual visit: patient, Marine scientist.   I discussed the limitations, risks, security and privacy concerns of performing an evaluation and management service by telephone and the availability of in person appointments. I also discussed with the patient that there may be a patient responsible charge related to this service. The patient expressed understanding and verbally consented to this telephonic visit.    Interactive audio and video telecommunications were attempted between this nurse and patient, however failed, due to patient having technical difficulties OR patient did not have access to video capability.  We continued and completed visit with audio only.     Cardiac Risk Factors include: advanced age (>32men, >47 women);dyslipidemia     Objective:     Vitals: There were no vitals taken for this visit.  There is no height or weight on file to calculate BMI.  Advanced Directives 07/25/2019 07/23/2018 07/10/2017 04/08/2017 05/23/2016  Does Patient Have a Medical Advance Directive? Yes Yes Yes No Yes  Type of Paramedic of [place];Living will [place];Living will [place];Living will - [place];Living will  Does patient want to make changes to medical advance directive? - No - Patient declined - - -  Copy of [place] in Chart? Yes - validated most recent copy scanned in chart (See row information) Yes - validated most recent copy scanned in chart (See row information) Yes - No - copy requested  Would patient like information on creating a medical advance  directive? - - - No - Patient declined -    Tobacco Social History   Tobacco Use  Smoking Status Former Smoker  Smokeless Tobacco Never Used     Counseling given: Not Answered   Clinical Intake:  Pre-visit preparation completed: Yes  Pain : 0-10 Pain Score: 5  Pain Type: Chronic pain Pain Location: Finger (Comment which one)(arthritis) Pain Orientation: Right, Left Pain Descriptors / Indicators: Aching Pain Onset: More than a month ago Pain Frequency: Intermittent     Nutritional Risks: None Diabetes: No  How often do you need to have someone help you when you read instructions, pamphlets, or other written materials from your doctor or pharmacy?: 1 - Never What is the last grade level you completed in school?: 12th  Interpreter Needed?: No  Information entered by :: CJohnson, LPN  Past Medical History:  Diagnosis Date  . AR (allergic rhinitis)   . Asthma   . Barrett esophagus   . Breast cancer ([place])    hx; s/p masectomy 1994  . Esophageal dysmotility    severe; has been elevated by rheumatology at [facility] but no connective tissue disease diagnosis was made. was ANA positive but anti-SCL 7- negative . 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  Marland Kitchen GERD (gastroesophageal reflux disease)    24 hr pH study did not show significant acid refulux  . HLD (hyperlipidemia)    elevated triglycerides  . Impaired fasting glucose   . Migraine headache   . Osteoarthritis   . Paroxysmal SVT (supraventricular tachycardia) (HCC)    hx; 5 beat run noted on holter monitor in 2005. EF 65%  . PKD (polycystic kidney disease)  Past Surgical History:  Procedure Laterality Date  . abt CT test  6/07   bilateral cysts, larget on R 9.6 x 6.7 cm  . BREAST RECONSTRUCTION  9/11  . colonoscopy/EGD  2005  . holter monitor  1/05   PVC, PACs, SVT  . kidney surgery - cyst removal  3/06  . MASTECTOMY Right   . PARTIAL HYSTERECTOMY  1983   due to fibroids  . radial masectomy  1993    R side  . right carpal tunnel  7/11  . rotator cuff, right  2004  . UPPER GI ENDOSCOPY  09/2016   Family History  Problem Relation Age of Onset  . Arthritis Father   . Prostate cancer Father   . Hypertension Father   . Polycystic kidney disease Mother   . CVA Mother   . Dementia Mother   . Heart Problems Mother        valve disease  . Diabetes Other        DM - MU and MA   . Heart attack Brother   . Hypertension Brother   . Ulcers Brother    Social History   Socioeconomic History  . Marital status: Widowed    Spouse name: Not on file  . Number of children: Not on file  . Years of education: Not on file  . Highest education level: Not on file  Occupational History  . Not on file  Tobacco Use  . Smoking status: Former Research scientist (life sciences)  . Smokeless tobacco: Never Used  Substance and Sexual Activity  . Alcohol use: Yes    Comment: 3 times a week   . Drug use: No  . Sexual activity: Yes  Other Topics Concern  . Not on file  Social History Narrative   Married, 2 adopted children (1B, 1G)   Originally from [place]; moved from [place] to [place].    Regular exercise - treadmill, 2 miles 3x/week; 4 miles daily    Retired - [employer]   Diet: avoids MSG (triggers HA), cultured cheese, coloring.       Pt signed designated party release form and gives Colbie Vescovi, spouse [phone] and, and son Malarie Cresap, [phone], access to medical records. Can leave msg on answering machine [phone] and on cell [phone].      Has living will, HCPOA husband, full code ( reviewed 2014)   Social Determinants of Health   Financial Resource Strain: Low Risk   . Difficulty of Paying Living Expenses: Not hard at all  Food Insecurity: No Food Insecurity  . Worried About Charity fundraiser in the Last Year: Never true  . Ran Out of Food in the Last Year: Never true  Transportation Needs: No Transportation Needs  . Lack of Transportation (Medical): No  . Lack of Transportation (Non-Medical): No  Physical  Activity: Sufficiently Active  . Days of Exercise per Week: 7 days  . Minutes of Exercise per Session: 30 min  Stress: No Stress Concern Present  . Feeling of Stress : Not at all  Social Connections:   . Frequency of Communication with Friends and Family:   . Frequency of Social Gatherings with Friends and Family:   . Attends Religious Services:   . Active Member of Clubs or Organizations:   . Attends Archivist Meetings:   Marland Kitchen Marital Status:     Outpatient Encounter Medications as of 07/25/2019  Medication Sig  . diphenhydramine-acetaminophen (TYLENOL PM) 25-500 MG TABS Take 1 tablet by mouth  at bedtime as needed.  . fenofibrate (TRICOR) 145 MG tablet Take 1 tablet (145 mg total) by mouth daily.  . fexofenadine (ALLEGRA) 60 MG tablet Take 60 mg by mouth daily.   . fluticasone (FLONASE) 50 MCG/ACT nasal spray Place 2 sprays into both nostrils daily.  . hyoscyamine (ANASPAZ) 0.125 MG TBDP Place 0.125 mg under the tongue as needed.    . Multiple Vitamins-Minerals (MULTIVITAMIN ADULT PO) Take by mouth.  . OMEPRAZOLE PO Take 20 mg by mouth 2 (two) times daily.  Marland Kitchen OVER THE COUNTER MEDICATION Micro Plex MVP one tablet by mouth daily  . Probiotic Product (PROBIOTIC-10 PO) Take by mouth.  . predniSONE (DELTASONE) 20 MG tablet 3 tabs by mouth daily x 3 days, then 2 tabs by mouth daily x 2 days then 1 tab by mouth daily x 2 days   No facility-administered encounter medications on file as of 07/25/2019.    Activities of Daily Living In your present state of health, do you have any difficulty performing the following activities: 07/25/2019  Hearing? Y  Comment wears hearing aids  Vision? N  Difficulty concentrating or making decisions? N  Walking or climbing stairs? N  Dressing or bathing? N  Doing errands, shopping? N  Preparing Food and eating ? N  Using the Toilet? N  In the past six months, have you accidently leaked urine? N  Do you have problems with loss of bowel control? N   Managing your Medications? N  Managing your Finances? N  Housekeeping or managing your Housekeeping? N  Some recent data might be hidden    Patient Care Team: Jinny Sanders, MD as PCP - General Thelma Comp, [place] as Consulting Physician (Optometry) Newell Coral., MD as Referring Physician (Gastroenterology) Donato Heinz, MD as Consulting Physician (Nephrology)    Assessment:   This is a routine wellness examination for Addisyn.  Exercise Activities and Dietary recommendations Current Exercise Habits: Home exercise routine, Type of exercise: walking, Time (Minutes): 30, Frequency (Times/Week): 7, Weekly Exercise (Minutes/Week): 210, Intensity: Moderate, Exercise limited by: None identified  Goals    . Patient Stated     Starting 07/23/18, I will continue to walk at least 7000 steps daily.     . Patient Stated     07/25/2019, I will continue to walk 7,000 steps everyday.        Fall Risk Fall Risk  07/25/2019 07/23/2018 07/10/2017 05/23/2016 05/28/2015  Falls in the past year? 0 0 No No Yes  Number falls in past yr: 0 - - - 1  Injury with Fall? 0 - - - Yes  Risk for fall due to : Medication side effect - - - -  Follow up Falls evaluation completed;Falls prevention discussed - - - -   Is the patient's home free of loose throw rugs in walkways, pet beds, electrical cords, etc?   yes      Grab bars in the bathroom? yes      Handrails on the stairs?   yes      Adequate lighting?   yes  Timed Get Up and Go performed: N/A  Depression Screen PHQ 2/9 Scores 07/25/2019 07/23/2018 07/10/2017 05/23/2016  PHQ - 2 Score 0 0 0 0  PHQ- 9 Score 0 0 0 -     Cognitive Function MMSE - Mini Mental State Exam 07/25/2019 07/23/2018 07/10/2017 05/23/2016  Not completed: Refused - - -  Orientation to time - 5 5 5   Orientation to Place -  5 5 5   Registration - 3 3 3   Attention/ Calculation - 0 0 0  Recall - 3 3 2   Recall-comments - - - pt was unable to recall 1 of 3 words  Language-  name 2 objects - 0 0 0  Language- repeat - 1 1 1   Language- follow 3 step command - 0 3 3  Language- read & follow direction - 0 0 0  Write a sentence - 0 0 0  Copy design - 0 0 0  Total score - 17 20 19   Mini Cog  Mini-Cog screen was not completed. Patient refused. Maximum score is 22. A value of 0 denotes this part of the MMSE was not completed or the patient failed this part of the Mini-Cog screening.       Immunization History  Administered Date(s) Administered  . Fluad Quad(high Dose 65+) 11/12/2018  . Influenza Split 11/21/2010, 12/13/2011  . Influenza Whole 01/04/2007, 12/02/2007, 12/02/2008, 12/29/2009  . Influenza, High Dose Seasonal PF 12/14/2015, 12/04/2016  . Influenza,inj,Quad PF,6+ Mos 12/11/2012, 01/21/2014, 12/19/2017  . Influenza-Unspecified 01/01/2015  . Moderna SARS-COVID-2 Vaccination 03/20/2019, 04/17/2019  . Pneumococcal Conjugate-13 02/09/2016  . Pneumococcal Polysaccharide-23 06/04/2005, 12/11/2012  . Td 01/05/2004  . Tdap 08/04/2012  . Zoster 07/16/2009  . Zoster Recombinat (Shingrix) 01/07/2018    Qualifies for Shingles Vaccine: administered 01/07/2018  Screening Tests Health Maintenance  Topic Date Due  . MAMMOGRAM  08/17/2019  . INFLUENZA VACCINE  10/05/2019  . TETANUS/TDAP  08/05/2022  . COLONOSCOPY  09/10/2024  . DEXA SCAN  Completed  . COVID-19 Vaccine  Completed  . Hepatitis C Screening  Completed  . PNA vac Low Risk Adult  Completed    Cancer Screenings: Lung: Low Dose CT Chest recommended if Age 32-80 years, 30 pack-year currently smoking OR have quit w/in 15 years. Patient does not qualify. Breast:  Up to date on Mammogram: Yes, completed 08/17/2018, scheduled 08/28/2019  Up to date of Bone Density/Dexa: Yes, completed 08/27/2017 Colorectal: completed 09/11/2014  Additional Screenings:  Hepatitis C Screening: 08/16/2010     Plan:   Patient will continue to walk everyday for 7,000 steps.    I have personally reviewed and noted the  following in the patient's chart:   . Medical and social history . Use of alcohol, tobacco or illicit drugs  . Current medications and supplements . Functional ability and status . Nutritional status . Physical activity . Advanced directives . List of other physicians . Hospitalizations, surgeries, and ER visits in previous 12 months . Vitals . Screenings to include cognitive, depression, and falls . Referrals and appointments  In addition, I have reviewed and discussed with patient certain preventive protocols, quality metrics, and best practice recommendations. A written personalized care plan for preventive services as well as general preventive health recommendations were provided to patient.     Andrez Grime, LPN  [phone]

## 2019-07-28 ENCOUNTER — Ambulatory Visit: Payer: Medicare Other

## 2019-08-01 ENCOUNTER — Other Ambulatory Visit: Payer: Self-pay

## 2019-08-01 ENCOUNTER — Encounter: Payer: Self-pay | Admitting: Family Medicine

## 2019-08-01 ENCOUNTER — Ambulatory Visit (INDEPENDENT_AMBULATORY_CARE_PROVIDER_SITE_OTHER): Payer: Medicare Other | Admitting: Family Medicine

## 2019-08-01 VITALS — BP 120/78 | HR 66 | Temp 98.2°F | Ht 62.5 in | Wt 152.2 lb

## 2019-08-01 DIAGNOSIS — Z0000 Encounter for general adult medical examination without abnormal findings: Secondary | ICD-10-CM | POA: Diagnosis not present

## 2019-08-01 DIAGNOSIS — Q613 Polycystic kidney, unspecified: Secondary | ICD-10-CM | POA: Diagnosis not present

## 2019-08-01 DIAGNOSIS — R0683 Snoring: Secondary | ICD-10-CM

## 2019-08-01 DIAGNOSIS — E782 Mixed hyperlipidemia: Secondary | ICD-10-CM | POA: Diagnosis not present

## 2019-08-01 DIAGNOSIS — J4520 Mild intermittent asthma, uncomplicated: Secondary | ICD-10-CM | POA: Diagnosis not present

## 2019-08-01 DIAGNOSIS — G4733 Obstructive sleep apnea (adult) (pediatric): Secondary | ICD-10-CM | POA: Insufficient documentation

## 2019-08-01 MED ORDER — ATORVASTATIN CALCIUM 10 MG PO TABS
10.0000 mg | ORAL_TABLET | Freq: Every day | ORAL | 11 refills | Status: DC
Start: 2019-08-01 — End: 2020-06-29

## 2019-08-01 NOTE — Patient Instructions (Addendum)
Stop tricor. Start atorvastatin 10 mg daily. You will be called for referral for sleep apnea.  Get back to water exercise.

## 2019-08-01 NOTE — Progress Notes (Signed)
Chief Complaint  Patient presents with  . Annual Exam    Part 2    History of Present Illness: HPI   The patient presents for annual medicare wellness, complete physical and review of chronic health problems. He/She also has the following acute concerns today:  She has noted that she snores at night, wakes up all of a sudden at night. Waking up 1-2 times a night. No known apnea.  Minimal fatigue at night. OCc AM headache  Using tylenol PM  The patient saw a LPN or RN for medicare wellness visit.  Prevention and wellness was reviewed in detail. Note reviewed and important notes copied below. Health Maintenance: No gaps noted Abnormal Screenings: none  08/01/19  Elevated Cholesterol:  On ticor Lab Results  Component Value Date   CHOL 177 07/25/2019   HDL 57.10 07/25/2019   LDLCALC 99 07/25/2019   LDLDIRECT 130.8 11/15/2009   TRIG 105.0 07/25/2019   CHOLHDL 3 07/25/2019  Using medications without problems: Muscle aches:  Diet compliance: low fat, low carb, gluten free Exercise: walking Other complaints: The 10-year ASCVD risk score Mikey Bussing DC Brooke Bonito., et al., 2013) is: 12.7%   Values used to calculate the score:     Age: 74 years     Sex: Female     Is Non-Hispanic African American: No     Diabetic: No     Tobacco smoker: No     Systolic Blood Pressure: 123456 mmHg     Is BP treated: No     HDL Cholesterol: 57.1 mg/dL     Total Cholesterol: 177 mg/dL   Polycystic kidney disease followed by renal, Dr. Zara Chess.   mild intermittent asthma Stable control.  Allegra controls allergies.  This visit occurred during the SARS-CoV-2 public health emergency.  Safety protocols were in place, including screening questions prior to the visit, additional usage of staff PPE, and extensive cleaning of exam room while observing appropriate contact time as indicated for disinfecting solutions.   COVID 19 screen:  No recent travel or known exposure to COVID19 The patient denies  respiratory symptoms of COVID 19 at this time. The importance of social distancing was discussed today.     Review of Systems  Constitutional: Negative for chills and fever.  HENT: Negative for congestion and ear pain.   Eyes: Negative for pain and redness.  Respiratory: Negative for cough and shortness of breath.   Cardiovascular: Negative for chest pain, palpitations and leg swelling.  Gastrointestinal: Negative for abdominal pain, blood in stool, constipation, diarrhea, nausea and vomiting.  Genitourinary: Negative for dysuria.  Musculoskeletal: Negative for falls and myalgias.  Skin: Negative for rash.  Neurological: Negative for dizziness.  Psychiatric/Behavioral: Negative for depression. The patient has insomnia. The patient is not nervous/anxious.       Past Medical History:  Diagnosis Date  . AR (allergic rhinitis)   . Asthma   . Barrett esophagus   . Breast cancer ([place])    hx; s/p masectomy 1994  . Esophageal dysmotility    severe; has been elevated by [facility]ology at [facility] but no connective tissue disease diagnosis was made. was ANA positive but anti-SCL 7- negative . 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  Marland Kitchen GERD (gastroesophageal reflux disease)    24 hr pH study did not show significant acid refulux  . HLD (hyperlipidemia)    elevated triglycerides  . Impaired fasting glucose   . Migraine headache   . Osteoarthritis   . Paroxysmal SVT (supraventricular tachycardia) (HCC)  hx; 5 beat run noted on holter monitor in 2005. EF 65%  . PKD (polycystic kidney disease)     reports that she has quit smoking. She has never used smokeless tobacco. She reports current alcohol use. She reports that she does not use drugs.   Current Outpatient Medications:  .  acetaminophen (TYLENOL 8 HOUR ARTHRITIS PAIN) 650 MG CR tablet, Take 650 mg by mouth every 8 (eight) hours as needed for pain., Disp: , Rfl:  .  diphenhydramine-acetaminophen (TYLENOL PM) 25-500 MG TABS, Take 1  tablet by mouth at bedtime as needed., Disp: , Rfl:  .  fenofibrate (TRICOR) 145 MG tablet, Take 1 tablet (145 mg total) by mouth daily., Disp: 90 tablet, Rfl: 3 .  fexofenadine (ALLEGRA) 60 MG tablet, Take 60 mg by mouth daily. , Disp: , Rfl:  .  fluticasone (FLONASE) 50 MCG/ACT nasal spray, Place 2 sprays into both nostrils daily., Disp: , Rfl:  .  hyoscyamine (ANASPAZ) 0.125 MG TBDP, Place 0.125 mg under the tongue as needed.  , Disp: , Rfl:  .  Multiple Vitamins-Minerals (MULTIVITAMIN ADULT PO), Take by mouth., Disp: , Rfl:  .  OMEPRAZOLE PO, Take 20 mg by mouth 2 (two) times daily., Disp: , Rfl:  .  OVER THE COUNTER MEDICATION, Micro Plex MVP one tablet by mouth daily, Disp: , Rfl:  .  Probiotic Product (PROBIOTIC-10 PO), Take by mouth., Disp: , Rfl:    Observations/Objective: Blood pressure 120/78, pulse 66, temperature 98.2 F (36.8 C), temperature source Temporal, height 5' 2.5" (1.588 m), weight 152 lb 4 oz (69.1 kg), SpO2 95 %.  Physical Exam   Assessment and Plan   The patient's preventative maintenance and recommended screening tests for an annual wellness exam were reviewed in full today. Brought up to date unless services declined.  Counselled on the importance of diet, exercise, and its role in overall health and mortality. The patient's FH and SH was reviewed, including their home life, tobacco status, and drug and alcohol status.   Mammo/breast exam: personal history of breast cancer: stable6/2020 Vaccines: Uptodate with PNA, flu, shingles, TDap, prevnar, COVID19 DVE/pap:partial hysterectomy, one ovary remains. DVEnot indicated, no pap indicated.  Colon: 09/2014 bx microscopic colitis, repeat in 10 years Dr. Clydene Laming at [facility]. DXA: 08/27/2017 normal.. repeat in 5 years Former smoker minimal use. Hep C negative  Hyperlipidemia  Increase CVD risk. Stop tricor and change to statin. Re-eval in 3 months.  Mild intermittent asthma Stable.  Snoring Possible sleep apnea..  refer to sleep specialist.    Eliezer Lofts, MD

## 2019-08-01 NOTE — Assessment & Plan Note (Signed)
Stable

## 2019-08-01 NOTE — Assessment & Plan Note (Signed)
Possible sleep apnea.. refer to sleep specialist.

## 2019-08-01 NOTE — Assessment & Plan Note (Signed)
Increase CVD risk. Stop tricor and change to statin. Re-eval in 3 months.

## 2019-08-21 ENCOUNTER — Other Ambulatory Visit: Payer: Self-pay | Admitting: Family Medicine

## 2019-08-28 ENCOUNTER — Other Ambulatory Visit: Payer: Self-pay

## 2019-08-28 ENCOUNTER — Ambulatory Visit
Admission: RE | Admit: 2019-08-28 | Discharge: 2019-08-28 | Disposition: A | Discharge status: Home / Self Care | Payer: Medicare Other | Source: Ambulatory Visit | Attending: Family Medicine | Admitting: Family Medicine

## 2019-08-28 DIAGNOSIS — Z1231 Encounter for screening mammogram for malignant neoplasm of breast: Secondary | ICD-10-CM

## 2019-09-19 ENCOUNTER — Ambulatory Visit: Payer: Medicare Other | Admitting: Primary Care

## 2019-09-19 ENCOUNTER — Other Ambulatory Visit: Payer: Self-pay

## 2019-09-19 ENCOUNTER — Encounter: Payer: Self-pay | Admitting: Primary Care

## 2019-09-19 VITALS — BP 124/70 | HR 67 | Temp 97.9°F | Ht 63.0 in | Wt 148.0 lb

## 2019-09-19 DIAGNOSIS — R0683 Snoring: Secondary | ICD-10-CM

## 2019-09-19 NOTE — Progress Notes (Signed)
@Patient  ID: Sydney Ortiz, female    DOB: [birth year], 74 y.o.   MRN: [id number]  Chief Complaint  Patient presents with  . sleep consult    per Dr. Diona Browner-- no prior sleep study. c/o restless sleep and loud snoring x1y    Referring provider: Jinny Sanders, MD  HPI: 74 year old female former smoker.  Past medical history significant for mild intermittent asthma, allergic rhinitis, migraine headache, Barrett's esophagus, GERD, osteoarthritis, polycystic kidney disease, history of breast cancer, hyperlipidemia.  Patient referred by Dr. Diona Browner for sleep consult.    09/19/2019 Patient presents today for new sleep consult.  She reports loud snoring and restless sleep for the last year.  Patient does not report choking or struggling to breathe at night. Patient wakes up frequently at night, approx 3 times.  Normal bedtime is between 10 and 11 PM.  It takes her approximately half an hour to fall asleep.  She wakes up at 7 AM.  Her weight has fluctuated 5 to 10 pounds over the last 2 years.  She has never had a previous sleep study.  She has acid reflux, followed with gastroenterologist at [facility]. States that she coughs when she eats. She has aspirated in the past. Denies daytime fatigue. Epworth score 6/24.   No Known Allergies  Immunization History  Administered Date(s) Administered  . Fluad Quad(high Dose 65+) 11/12/2018  . Influenza Split 11/21/2010, 12/13/2011  . Influenza Whole 01/04/2007, 12/02/2007, 12/02/2008, 12/29/2009  . Influenza, High Dose Seasonal PF 12/14/2015, 12/04/2016  . Influenza,inj,Quad PF,6+ Mos 12/11/2012, 01/21/2014, 12/19/2017  . Influenza-Unspecified 01/01/2015  . Moderna SARS-COVID-2 Vaccination 03/20/2019, 04/17/2019  . Pneumococcal Conjugate-13 02/09/2016  . Pneumococcal Polysaccharide-23 06/04/2005, 12/11/2012  . Td 01/05/2004  . Tdap 08/04/2012  . Zoster 07/16/2009  . Zoster Recombinat (Shingrix) 01/07/2018    Past Medical History:  Diagnosis Date  . AR  (allergic rhinitis)   . Asthma   . Barrett esophagus   . Breast cancer ([place])    hx; s/p masectomy 1994  . Esophageal dysmotility    severe; has been elevated by rheumatology at [facility] but no connective tissue disease diagnosis was made. was ANA positive but anti-SCL 7- negative . 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  Marland Kitchen GERD (gastroesophageal reflux disease)    24 hr pH study did not show significant acid refulux  . HLD (hyperlipidemia)    elevated triglycerides  . Impaired fasting glucose   . Migraine headache   . Osteoarthritis   . Paroxysmal SVT (supraventricular tachycardia) (HCC)    hx; 5 beat run noted on holter monitor in 2005. EF 65%  . PKD (polycystic kidney disease)     Tobacco History: Social History   Tobacco Use  Smoking Status Former Smoker  . Packs/day: 0.25  . Years: 10.00  . Pack years: 2.50  . Quit date: 48  . Years since quitting: 35.5  Smokeless Tobacco Never Used   Counseling given: Not Answered   Outpatient Medications Prior to Visit  Medication Sig Dispense Refill  . acetaminophen (TYLENOL 8 HOUR ARTHRITIS PAIN) 650 MG CR tablet Take 650 mg by mouth every 8 (eight) hours as needed for pain.    Marland Kitchen atorvastatin (LIPITOR) 10 MG tablet Take 1 tablet (10 mg total) by mouth daily. 30 tablet 11  . diphenhydramine-acetaminophen (TYLENOL PM) 25-500 MG TABS Take 1 tablet by mouth at bedtime as needed.    . fexofenadine (ALLEGRA) 60 MG tablet Take 60 mg by mouth daily.     . fluticasone (FLONASE) 50  MCG/ACT nasal spray Place 2 sprays into both nostrils daily.    . hyoscyamine (ANASPAZ) 0.125 MG TBDP Place 0.125 mg under the tongue as needed.      . Multiple Vitamins-Minerals (MULTIVITAMIN ADULT PO) Take by mouth.    . OMEPRAZOLE PO Take 20 mg by mouth 2 (two) times daily.    Marland Kitchen OVER THE COUNTER MEDICATION Micro Plex MVP one tablet by mouth daily    . Probiotic Product (PROBIOTIC-10 PO) Take by mouth.     No facility-administered medications prior to  visit.    Review of Systems  Review of Systems  Constitutional: Negative for fatigue and unexpected weight change.  HENT: Positive for congestion and postnasal drip.        Nasal congestion   Respiratory: Positive for cough. Negative for chest tightness and shortness of breath.   Cardiovascular: Negative for chest pain and palpitations.   Physical Exam  BP 124/70 (BP Location: Left Arm, Cuff Size: Normal)   Pulse 67   Temp 97.9 F (36.6 C) (Temporal)   Ht 5\' 3"  (1.6 m)   Wt 148 lb (67.1 kg)   SpO2 96%   BMI 26.22 kg/m  Physical Exam Constitutional:      Appearance: Normal appearance.  HENT:     Head: Normocephalic and atraumatic.     Mouth/Throat:     Mouth: Mucous membranes are moist.     Pharynx: Oropharynx is clear.  Cardiovascular:     Rate and Rhythm: Normal rate and regular rhythm.  Pulmonary:     Effort: Pulmonary effort is normal.     Breath sounds: Normal breath sounds.  Neurological:     General: No focal deficit present.     Mental Status: She is alert and oriented to person, place, and time. Mental status is at baseline.  Psychiatric:        Mood and Affect: Mood normal.        Thought Content: Thought content normal.        Judgment: Judgment normal.      Lab Results:  CBC    Component Value Date/Time   WBC 4.8 05/25/2015 0820   RBC 4.20 05/25/2015 0820   HGB 12.9 05/25/2015 0820   HGB 12.6 09/24/2011 0949   HCT 38.7 05/25/2015 0820   HCT 37.9 09/24/2011 0949   PLT 252.0 05/25/2015 0820   PLT 209 09/24/2011 0949   MCV 92.3 05/25/2015 0820   MCV 92 09/24/2011 0949   MCH 30.7 09/24/2011 0949   MCH 30.6 11/22/2009 1155   MCHC 33.4 05/25/2015 0820   RDW 15.3 05/25/2015 0820   RDW 13.9 09/24/2011 0949   LYMPHSABS 2.5 05/25/2015 0820   MONOABS 0.5 05/25/2015 0820   EOSABS 0.1 05/25/2015 0820   BASOSABS 0.0 05/25/2015 0820    BMET    Component Value Date/Time   NA 140 07/25/2019 0743   NA 139 09/24/2011 0949   K 4.0 07/25/2019 0743    K 3.7 09/24/2011 0949   CL 107 07/25/2019 0743   CL 107 09/24/2011 0949   CO2 28 07/25/2019 0743   CO2 25 09/24/2011 0949   GLUCOSE 94 07/25/2019 0743   GLUCOSE 156 (H) 09/24/2011 0949   BUN 17 07/25/2019 0743   BUN 10 09/24/2011 0949   CREATININE 0.88 07/25/2019 0743   CREATININE 0.94 09/24/2011 0949   CALCIUM 9.4 07/25/2019 0743   CALCIUM 9.6 09/24/2011 0949   GFRNONAA >60 09/24/2011 0949   GFRAA >60 09/24/2011 7829  BNP No results found for: BNP  ProBNP    Component Value Date/Time   PROBNP 8.0 02/12/2018 1259    Imaging: MM 3D SCREEN BREAST UNI LEFT  Result Date: 09/01/2019 CLINICAL DATA:  Screening. EXAM: DIGITAL SCREENING UNILATERAL LEFT MAMMOGRAM WITH CAD AND TOMO COMPARISON:  Previous exam(s). ACR Breast Density Category c: The breast tissue is heterogeneously dense, which may obscure small masses. FINDINGS: There are no findings suspicious for malignancy. Images were processed with CAD. IMPRESSION: No mammographic evidence of malignancy. A result letter of this screening mammogram will be mailed directly to the patient. RECOMMENDATION: Screening mammogram in one year. (Code:SM-B-01Y) BI-RADS CATEGORY  1: Negative. Electronically Signed   By: Abelardo Diesel M.D.   On: 09/01/2019 10:45     Assessment & Plan:   Snoring - Patient reports loud snoring and restless sleep for the last year - Epworth 6/24 - Needs home sleep test to evaluate for underlying sleep apnea - Discussed treatment options and potential medical complications of untreated OSA - Encouraged patient to maintain healthy weight and get some form of daily activity - Avoid sedating medication and excessive alcohol use before bedtime as these can worsen potentially underlying sleep apnea - FU after HST in OV or by telephone      Martyn Ehrich, NP 09/22/2019

## 2019-09-19 NOTE — Patient Instructions (Addendum)
Pleasure meeting you today Sydney Ortiz  Recommendations: Maintain healthy weight and some form of daily activity Side sleeping position is best Avoid sedating medication and excessive alcohol use before bedtime as these can worsen potentially underlying sleep apnea  Orders: Home sleep test [id number]  Follow-up: We will follow-up by phone after sleep study (may take 1-2 weeks for sleep doctor to read your test)   Sleep Apnea Sleep apnea affects breathing during sleep. It causes breathing to stop for a short time or to become shallow. It can also increase the risk of:  Heart attack.  Stroke.  Being very overweight (obese).  Diabetes.  Heart failure.  Irregular heartbeat. The goal of treatment is to help you breathe normally again. What are the causes? There are three kinds of sleep apnea:  Obstructive sleep apnea. This is caused by a blocked or collapsed airway.  Central sleep apnea. This happens when the brain does not send the right signals to the muscles that control breathing.  Mixed sleep apnea. This is a combination of obstructive and central sleep apnea. The most common cause of this condition is a collapsed or blocked airway. This can happen if:  Your throat muscles are too relaxed.  Your tongue and tonsils are too large.  You are overweight.  Your airway is too small. What increases the risk?  Being overweight.  Smoking.  Having a small airway.  Being older.  Being female.  Drinking alcohol.  Taking medicines to calm yourself (sedatives or tranquilizers).  Having family members with the condition. What are the signs or symptoms?  Trouble staying asleep.  Being sleepy or tired during the day.  Getting angry a lot.  Loud snoring.  Headaches in the morning.  Not being able to focus your mind (concentrate).  Forgetting things.  Less interest in sex.  Mood swings.  Personality changes.  Feelings of sadness (depression).  Waking up a  lot during the night to pee (urinate).  Dry mouth.  Sore throat. How is this diagnosed?  Your medical history.  A physical exam.  A test that is done when you are sleeping (sleep study). The test is most often done in a sleep lab but may also be done at home. How is this treated?   Sleeping on your side.  Using a medicine to get rid of mucus in your nose (decongestant).  Avoiding the use of alcohol, medicines to help you relax, or certain pain medicines (narcotics).  Losing weight, if needed.  Changing your diet.  Not smoking.  Using a machine to open your airway while you sleep, such as: ? An oral appliance. This is a mouthpiece that shifts your lower jaw forward. ? A CPAP device. This device blows air through a mask when you breathe out (exhale). ? An EPAP device. This has valves that you put in each nostril. ? A BPAP device. This device blows air through a mask when you breathe in (inhale) and breathe out.  Having surgery if other treatments do not work. It is important to get treatment for sleep apnea. Without treatment, it can lead to:  High blood pressure.  Coronary artery disease.  In men, not being able to have an erection (impotence).  Reduced thinking ability. Follow these instructions at home: Lifestyle  Make changes that your doctor recommends.  Eat a healthy diet.  Lose weight if needed.  Avoid alcohol, medicines to help you relax, and some pain medicines.  Do not use any products that contain nicotine  or tobacco, such as cigarettes, e-cigarettes, and chewing tobacco. If you need help quitting, ask your doctor. General instructions  Take over-the-counter and prescription medicines only as told by your doctor.  If you were given a machine to use while you sleep, use it only as told by your doctor.  If you are having surgery, make sure to tell your doctor you have sleep apnea. You may need to bring your device with you.  Keep all follow-up  visits as told by your doctor. This is important. Contact a doctor if:  The machine that you were given to use during sleep bothers you or does not seem to be working.  You do not get better.  You get worse. Get help right away if:  Your chest hurts.  You have trouble breathing in enough air.  You have an uncomfortable feeling in your back, arms, or stomach.  You have trouble talking.  One side of your body feels weak.  A part of your face is hanging down. These symptoms may be an emergency. Do not wait to see if the symptoms will go away. Get medical help right away. Call your local emergency services (911 in the [place] Do not drive yourself to the hospital. Summary  This condition affects breathing during sleep.  The most common cause is a collapsed or blocked airway.  The goal of treatment is to help you breathe normally while you sleep. This information is not intended to replace advice given to you by your health care provider. Make sure you discuss any questions you have with your health care provider. Document Revised: 12/07/2017 Document Reviewed: 10/16/2017 Elsevier Patient Education  [place].

## 2019-09-22 ENCOUNTER — Encounter: Payer: Self-pay | Admitting: Primary Care

## 2019-09-22 NOTE — Assessment & Plan Note (Signed)
-   Patient reports loud snoring and restless sleep for the last year - Epworth 6/24 - Needs home sleep test to evaluate for underlying sleep apnea - Discussed treatment options and potential medical complications of untreated OSA - Encouraged patient to maintain healthy weight and get some form of daily activity - Avoid sedating medication and excessive alcohol use before bedtime as these can worsen potentially underlying sleep apnea - FU after HST in OV or by telephone

## 2019-09-22 NOTE — Progress Notes (Signed)
Reviewed and agree with assessment/plan.   Chesley Mires, MD [facility] Pulmonary/Critical Care 09/22/2019, 1:40 PM Pager:  [phone]

## 2019-09-24 ENCOUNTER — Ambulatory Visit: Payer: Medicare Other

## 2019-09-24 ENCOUNTER — Other Ambulatory Visit: Payer: Self-pay

## 2019-09-24 DIAGNOSIS — G4733 Obstructive sleep apnea (adult) (pediatric): Secondary | ICD-10-CM | POA: Diagnosis not present

## 2019-09-24 DIAGNOSIS — R0683 Snoring: Secondary | ICD-10-CM

## 2019-09-26 ENCOUNTER — Telehealth: Payer: Self-pay | Admitting: Primary Care

## 2019-09-26 DIAGNOSIS — G4733 Obstructive sleep apnea (adult) (pediatric): Secondary | ICD-10-CM | POA: Diagnosis not present

## 2019-09-26 NOTE — Telephone Encounter (Signed)
-----   Message from Chesley Mires, MD sent at 09/26/2019  8:18 AM EDT ----- Home sleep study from 09/24/19 showed mild obstructive sleep apnea with an AHI of 13.6 and SpO2 low of 77%.  Thanks.  Sydney Ortiz

## 2019-09-26 NOTE — Telephone Encounter (Signed)
Lease let patient know HST showed mild OSA. We can review and discuss best treatment options for her at her next vist on 8/12

## 2019-09-26 NOTE — Telephone Encounter (Signed)
Contacted patient, appointment time reviewed, results of HST reviewed. Patient verbalized understanding.

## 2019-10-15 NOTE — Progress Notes (Signed)
Virtual Visit via Telephone Note  I connected with Sydney Ortiz on 10/15/19 at 10:00 AM EDT by telephone and verified that I am speaking with the correct person using two identifiers.  Location: Patient: Home Provider: Office   I discussed the limitations, risks, security and privacy concerns of performing an evaluation and management service by telephone and the availability of in person appointments. I also discussed with the patient that there may be a patient responsible charge related to this service. The patient expressed understanding and agreed to proceed.   History of Present Illness: 74 year old female former smoker.  Past medical history significant for mild intermittent asthma, allergic rhinitis, migraine headache, Barrett's esophagus, GERD, osteoarthritis, polycystic kidney disease, history of breast cancer, hyperlipidemia.  Patient referred by Dr. Diona Browner for sleep consult.   Previous [facility] pulmonary encounters: 09/19/2019 Patient presents today for new sleep consult.  She reports loud snoring and restless sleep for the last year.  Patient does not report choking or struggling to breathe at night. Patient wakes up frequently at night, approx 3 times.  Normal bedtime is between 10 and 11 PM.  It takes her approximately half an hour to fall asleep.  She wakes up at 7 AM.  Her weight has fluctuated 5 to 10 pounds over the last 2 years.  She has never had a previous sleep study.  She has acid reflux, followed with gastroenterologist at [facility]. States that she coughs when she eats. She has aspirated in the past. Denies daytime fatigue. Epworth score 6/24.   10/16/2019 Patient contacted today for follow-up OSA. Home sleep study from 09/24/19 showed mild obstructive sleep apnea with an AHI of 13.6/hr and SpO2 low of 77%. Discussed treatment options with patient including  weight loss, oral appliance, CPAP therapy or referral to ENT for surgical assessment. She has elected to meet with orthodontic  dentist to discuss treatment with oral appliance.   Observations/Objective:  - Able to speak in full sentences, no significant shortness of breath or wheezing   Assessment and Plan:  Mild OSA: - HST 09/24/19 >> AHI 13.6/hr. Weight 148lbs - Treatment options discussed, patient has elected to meet with orthodontic dentist to discuss oral appliance  - Advised patient not to drive if experiencing excessive daytime fatigue or somnolence.   Follow Up Instructions:  - 6 months with Dr. Halford Chessman    I discussed the assessment and treatment plan with the patient. The patient was provided an opportunity to ask questions and all were answered. The patient agreed with the plan and demonstrated an understanding of the instructions.   The patient was advised to call back or seek an in-person evaluation if the symptoms worsen or if the condition fails to improve as anticipated.  I provided 18 minutes of non-face-to-face time during this encounter.   Martyn Ehrich, NP

## 2019-10-16 ENCOUNTER — Ambulatory Visit (INDEPENDENT_AMBULATORY_CARE_PROVIDER_SITE_OTHER): Payer: Medicare Other | Admitting: Primary Care

## 2019-10-16 ENCOUNTER — Other Ambulatory Visit: Payer: Self-pay

## 2019-10-16 DIAGNOSIS — G4733 Obstructive sleep apnea (adult) (pediatric): Secondary | ICD-10-CM

## 2019-10-16 NOTE — Addendum Note (Signed)
Addended by: Mathis Bud on: 10/16/2019 12:21 PM   Modules accepted: Orders

## 2019-10-16 NOTE — Progress Notes (Signed)
Reviewed and agree with assessment/plan.   Chesley Mires, MD [facility] Pulmonary/Critical Care 10/16/2019, 1:47 PM Pager:  [phone]

## 2019-10-16 NOTE — Patient Instructions (Addendum)
Pleasure speaking to you today [facility] sleep study showed mild obstructive sleep apnea. Treatment options include weight loss, oral appliance, CPAP therapy or referral to ENT for surgical assessment. Below are the two names of orthodontic dentists who specialize in sleep apnea with oral appliance.   Referral:  - Dr. Augustina Mood [phone] - Dr. Oneal Grout [phone]  Recommendations: - Maintain healthy weight, side sleeping position, do not drive if experiencing excessive daytime fatigue or somnolence   Follow-up: - 6 months with Dr. Halford Chessman -30 mins new patient (in [facility])    Sleep Apnea Sleep apnea affects breathing during sleep. It causes breathing to stop for a short time or to become shallow. It can also increase the risk of:  Heart attack.  Stroke.  Being very overweight (obese).  Diabetes.  Heart failure.  Irregular heartbeat. The goal of treatment is to help you breathe normally again. What are the causes? There are three kinds of sleep apnea:  Obstructive sleep apnea. This is caused by a blocked or collapsed airway.  Central sleep apnea. This happens when the brain does not send the right signals to the muscles that control breathing.  Mixed sleep apnea. This is a combination of obstructive and central sleep apnea. The most common cause of this condition is a collapsed or blocked airway. This can happen if:  Your throat muscles are too relaxed.  Your tongue and tonsils are too large.  You are overweight.  Your airway is too small. What increases the risk?  Being overweight.  Smoking.  Having a small airway.  Being older.  Being female.  Drinking alcohol.  Taking medicines to calm yourself (sedatives or tranquilizers).  Having family members with the condition. What are the signs or symptoms?  Trouble staying asleep.  Being sleepy or tired during the day.  Getting angry a lot.  Loud snoring.  Headaches in the  morning.  Not being able to focus your mind (concentrate).  Forgetting things.  Less interest in sex.  Mood swings.  Personality changes.  Feelings of sadness (depression).  Waking up a lot during the night to pee (urinate).  Dry mouth.  Sore throat. How is this diagnosed?  Your medical history.  A physical exam.  A test that is done when you are sleeping (sleep study). The test is most often done in a sleep lab but may also be done at home. How is this treated?   Sleeping on your side.  Using a medicine to get rid of mucus in your nose (decongestant).  Avoiding the use of alcohol, medicines to help you relax, or certain pain medicines (narcotics).  Losing weight, if needed.  Changing your diet.  Not smoking.  Using a machine to open your airway while you sleep, such as: ? An oral appliance. This is a mouthpiece that shifts your lower jaw forward. ? A CPAP device. This device blows air through a mask when you breathe out (exhale). ? An EPAP device. This has valves that you put in each nostril. ? A BPAP device. This device blows air through a mask when you breathe in (inhale) and breathe out.  Having surgery if other treatments do not work. It is important to get treatment for sleep apnea. Without treatment, it can lead to:  High blood pressure.  Coronary artery disease.  In men, not being able to have an erection (impotence).  Reduced thinking ability. Follow these instructions at home: Lifestyle  Make changes that your doctor recommends.  Eat a healthy diet.  Lose weight if needed.  Avoid alcohol, medicines to help you relax, and some pain medicines.  Do not use any products that contain nicotine or tobacco, such as cigarettes, e-cigarettes, and chewing tobacco. If you need help quitting, ask your doctor. General instructions  Take over-the-counter and prescription medicines only as told by your doctor.  If you were given a machine to use  while you sleep, use it only as told by your doctor.  If you are having surgery, make sure to tell your doctor you have sleep apnea. You may need to bring your device with you.  Keep all follow-up visits as told by your doctor. This is important. Contact a doctor if:  The machine that you were given to use during sleep bothers you or does not seem to be working.  You do not get better.  You get worse. Get help right away if:  Your chest hurts.  You have trouble breathing in enough air.  You have an uncomfortable feeling in your back, arms, or stomach.  You have trouble talking.  One side of your body feels weak.  A part of your face is hanging down. These symptoms may be an emergency. Do not wait to see if the symptoms will go away. Get medical help right away. Call your local emergency services (911 in the [place] Do not drive yourself to the hospital. Summary  This condition affects breathing during sleep.  The most common cause is a collapsed or blocked airway.  The goal of treatment is to help you breathe normally while you sleep. This information is not intended to replace advice given to you by your health care provider. Make sure you discuss any questions you have with your health care provider. Document Revised: 12/07/2017 Document Reviewed: 10/16/2017 Elsevier Patient Education  [place].

## 2019-10-30 ENCOUNTER — Other Ambulatory Visit (INDEPENDENT_AMBULATORY_CARE_PROVIDER_SITE_OTHER): Payer: Medicare Other

## 2019-10-30 ENCOUNTER — Other Ambulatory Visit: Payer: Self-pay

## 2019-10-30 DIAGNOSIS — E782 Mixed hyperlipidemia: Secondary | ICD-10-CM | POA: Diagnosis not present

## 2019-10-30 LAB — LIPID PANEL
Cholesterol: 167 mg/dL (ref 0–200)
HDL: 59.1 mg/dL (ref >39.00)
LDL Cholesterol: 81 mg/dL (ref 0–99)
NonHDL: 108.12
Total CHOL/HDL Ratio: 3
Triglycerides: 138 mg/dL (ref 0.0–149.0)
VLDL: 27.6 mg/dL (ref 0.0–40.0)

## 2019-10-30 LAB — COMPREHENSIVE METABOLIC PANEL
ALT: 22 U/L (ref 0–35)
AST: 20 U/L (ref 0–37)
Albumin: 4.2 g/dL (ref 3.5–5.2)
Alkaline Phosphatase: 63 U/L (ref 39–117)
BUN: 16 mg/dL (ref 6–23)
CO2: 29 mEq/L (ref 19–32)
Calcium: 9.8 mg/dL (ref 8.4–10.5)
Chloride: 103 mEq/L (ref 96–112)
Creatinine, Ser: 0.83 mg/dL (ref 0.40–1.20)
GFR: 67.12 mL/min (ref >60.00)
Glucose, Bld: 97 mg/dL (ref 70–99)
Potassium: 4.3 mEq/L (ref 3.5–5.1)
Sodium: 140 mEq/L (ref 135–145)
Total Bilirubin: 0.5 mg/dL (ref 0.2–1.2)
Total Protein: 6.9 g/dL (ref 6.0–8.3)

## 2019-11-25 ENCOUNTER — Other Ambulatory Visit: Payer: Self-pay | Admitting: Nephrology

## 2019-11-25 DIAGNOSIS — Q613 Polycystic kidney, unspecified: Secondary | ICD-10-CM

## 2019-11-25 DIAGNOSIS — N189 Chronic kidney disease, unspecified: Secondary | ICD-10-CM

## 2019-12-01 ENCOUNTER — Other Ambulatory Visit: Payer: Medicare Other

## 2019-12-04 ENCOUNTER — Ambulatory Visit
Admission: RE | Admit: 2019-12-04 | Discharge: 2019-12-04 | Disposition: A | Discharge status: Home / Self Care | Payer: Medicare Other | Source: Ambulatory Visit | Attending: Nephrology | Admitting: Nephrology

## 2019-12-04 DIAGNOSIS — N189 Chronic kidney disease, unspecified: Secondary | ICD-10-CM

## 2019-12-04 DIAGNOSIS — Q613 Polycystic kidney, unspecified: Secondary | ICD-10-CM

## 2019-12-13 ENCOUNTER — Other Ambulatory Visit: Payer: Self-pay

## 2019-12-13 ENCOUNTER — Ambulatory Visit (INDEPENDENT_AMBULATORY_CARE_PROVIDER_SITE_OTHER): Payer: Medicare Other

## 2019-12-13 DIAGNOSIS — Z23 Encounter for immunization: Secondary | ICD-10-CM | POA: Diagnosis not present

## 2019-12-26 ENCOUNTER — Other Ambulatory Visit: Payer: Self-pay

## 2019-12-26 ENCOUNTER — Ambulatory Visit: Payer: Medicare Other | Admitting: Family Medicine

## 2019-12-26 ENCOUNTER — Ambulatory Visit (INDEPENDENT_AMBULATORY_CARE_PROVIDER_SITE_OTHER)
Admission: RE | Admit: 2019-12-26 | Discharge: 2019-12-26 | Disposition: A | Discharge status: Home / Self Care | Payer: Medicare Other | Source: Ambulatory Visit | Attending: Family Medicine | Admitting: Family Medicine

## 2019-12-26 ENCOUNTER — Encounter: Payer: Self-pay | Admitting: Family Medicine

## 2019-12-26 ENCOUNTER — Other Ambulatory Visit: Payer: Self-pay | Admitting: Family Medicine

## 2019-12-26 VITALS — BP 130/74 | HR 60 | Temp 98.1°F | Ht 63.0 in | Wt 146.8 lb

## 2019-12-26 DIAGNOSIS — M25561 Pain in right knee: Secondary | ICD-10-CM

## 2019-12-26 NOTE — Progress Notes (Signed)
Chief Complaint  Patient presents with  . Knee Pain    Right  . Toe Pain    Right Big Toe  . Hand Pain    Right  . Arm Pain    Right    History of Present Illness: HPI   74 year   old female presents with new onset pain in right knee, right great toe, right hand and right arm.   She has had pain in right  Upper and lower leg x several months... had issues in the past but it has seemed to come back in   Right side of body feels achy off and on.Marland Kitchen occ wakes her up at ngiht.  No redness,  No heat. She has been doing stretching and water exercise. No low back pain or buttock pain. No neck pain. Hat helps some.   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  Voltaren gel three times daily. Using tylenol for arthritis.  Hx of kidney issues. She has new med: on statin 10 mg since 07/2019  This visit occurred during the SARS-CoV-2 public health emergency.  Safety protocols were in place, including screening questions prior to the visit, additional usage of staff PPE, and extensive cleaning of exam room while observing appropriate contact time as indicated for disinfecting solutions.   COVID 19 screen:  No recent travel or known exposure to COVID19 The patient denies respiratory symptoms of COVID 19 at this time. The importance of social distancing was discussed today.     Review of Systems  Constitutional: Negative for chills and fever.  HENT: Negative for congestion and ear pain.   Eyes: Negative for pain and redness.  Respiratory: Negative for cough and shortness of breath.   Cardiovascular: Negative for chest pain, palpitations and leg swelling.  Gastrointestinal: Negative for abdominal pain, blood in stool, constipation, diarrhea, nausea and vomiting.  Genitourinary: Negative for dysuria.  Musculoskeletal: Negative for falls and myalgias.  Skin: Negative for rash.  Neurological: Negative for dizziness.  Psychiatric/Behavioral: Negative for depression.  The patient is not nervous/anxious.       Past Medical History:  Diagnosis Date  . AR (allergic rhinitis)   . Asthma   . Barrett esophagus   . Breast cancer ([place])    hx; s/p masectomy 1994  . Esophageal dysmotility    severe; has been elevated by rheumatology at [facility] but no connective tissue disease diagnosis was made. was ANA positive but anti-SCL 7- negative . 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  Marland Kitchen GERD (gastroesophageal reflux disease)    24 hr pH study did not show significant acid refulux  . HLD (hyperlipidemia)    elevated triglycerides  . Impaired fasting glucose   . Migraine headache   . Osteoarthritis   . Paroxysmal SVT (supraventricular tachycardia) ([facility])    hx; 5 beat run noted on holter monitor in 2005. EF 65%  . PKD (polycystic kidney disease)     reports that she quit smoking about 35 years ago. She has a 2.50 pack-year smoking history. She has never used smokeless tobacco. She reports current alcohol use. She reports that she does not use drugs.   Current Outpatient Medications:  .  acetaminophen (TYLENOL 8 HOUR ARTHRITIS PAIN) 650 MG CR tablet, Take 650 mg by mouth every 8 (eight) hours as needed for pain., Disp: , Rfl:  .  atorvastatin (LIPITOR) 10 MG tablet, Take 1 tablet (10 mg total) by mouth daily., Disp: 30 tablet, Rfl: 11 .  diphenhydramine-acetaminophen (TYLENOL  PM) 25-500 MG TABS, Take 1 tablet by mouth at bedtime as needed., Disp: , Rfl:  .  fexofenadine (ALLEGRA) 60 MG tablet, Take 60 mg by mouth daily. , Disp: , Rfl:  .  fluticasone (FLONASE) 50 MCG/ACT nasal spray, Place 2 sprays into both nostrils daily., Disp: , Rfl:  .  hyoscyamine (ANASPAZ) 0.125 MG TBDP, Place 0.125 mg under the tongue as needed.  , Disp: , Rfl:  .  Multiple Vitamins-Minerals (MULTIVITAMIN ADULT PO), Take by mouth., Disp: , Rfl:  .  omeprazole (PRILOSEC) 20 MG capsule, Take 20 mg by mouth 2 (two) times daily., Disp: , Rfl:  .  OVER THE COUNTER MEDICATION, Micro Plex MVP  one tablet by mouth daily, Disp: , Rfl:  .  Probiotic Product (PROBIOTIC-10 PO), Take by mouth., Disp: , Rfl:    Observations/Objective: Blood pressure 130/74, pulse 60, temperature 98.1 F (36.7 C), temperature source Temporal, height 5\' 3"  (1.6 m), weight 146 lb 12 oz (66.6 kg), SpO2 94 %.  Physical Exam Constitutional:      General: She is not in acute distress.Vital signs are normal.     Appearance: Normal appearance. She is well-developed and well-nourished. She is not ill-appearing or toxic-appearing.  HENT:     Head: Normocephalic.     Right Ear: Hearing, tympanic membrane, ear canal and external ear normal. Tympanic membrane is not erythematous, retracted or bulging.     Left Ear: Hearing, tympanic membrane, ear canal and external ear normal. Tympanic membrane is not erythematous, retracted or bulging.     Nose: No mucosal edema or rhinorrhea.     Right Sinus: No maxillary sinus tenderness or frontal sinus tenderness.     Left Sinus: No maxillary sinus tenderness or frontal sinus tenderness.     Mouth/Throat:     Mouth: Oropharynx is clear and moist and mucous membranes are normal.     Pharynx: Uvula midline.  Eyes:     General: Lids are normal. Lids are everted, no foreign bodies appreciated.     Extraocular Movements: EOM normal.     Conjunctiva/sclera: Conjunctivae normal.     Pupils: Pupils are equal, round, and reactive to light.  Neck:     Thyroid: No thyroid mass or thyromegaly.     Vascular: No carotid bruit.     Trachea: Trachea normal.  Cardiovascular:     Rate and Rhythm: Normal rate and regular rhythm.     Pulses: Normal pulses and intact distal pulses.     Heart sounds: Normal heart sounds, S1 normal and S2 normal. No murmur heard. No friction rub. No gallop.   Pulmonary:     Effort: Pulmonary effort is normal. No tachypnea or respiratory distress.     Breath sounds: Normal breath sounds. No decreased breath sounds, wheezing, rhonchi or rales.  Abdominal:      General: Bowel sounds are normal.     Palpations: Abdomen is soft.     Tenderness: There is no abdominal tenderness.  Musculoskeletal:     Cervical back: Normal range of motion and neck supple.  Skin:    General: Skin is warm, dry and intact.     Findings: No rash.  Neurological:     Mental Status: She is alert.  Psychiatric:        Mood and Affect: Mood is not anxious or depressed.        Speech: Speech normal.        Behavior: Behavior normal. Behavior is cooperative.  Thought Content: Thought content normal.        Cognition and Memory: Cognition and memory normal.        Judgment: Judgment normal.      Assessment and Plan    Problem List Items Addressed This Visit    Acute pain of right knee - Primary     Eval with X-ray.  Not clearly referred from back but unusual right sided achy muscle pain pain. Increase topical voltaren to four times daily.  Hold atorvastatin for 1-2 weeks... if not improving restart.          Eliezer Lofts, MD

## 2019-12-26 NOTE — Patient Instructions (Addendum)
Increase topical voltaren to four times daily.  Hold atorvastatin for 1-2 weeks... if not improving restart.  We will call with results of X-ray.

## 2020-01-05 ENCOUNTER — Encounter: Payer: Self-pay | Admitting: Family Medicine

## 2020-01-05 ENCOUNTER — Other Ambulatory Visit: Payer: Self-pay

## 2020-01-05 ENCOUNTER — Ambulatory Visit: Payer: Medicare Other | Admitting: Family Medicine

## 2020-01-05 VITALS — BP 102/62 | HR 68 | Temp 98.0°F | Ht 63.0 in | Wt 149.2 lb

## 2020-01-05 DIAGNOSIS — M1711 Unilateral primary osteoarthritis, right knee: Secondary | ICD-10-CM

## 2020-01-05 DIAGNOSIS — M6281 Muscle weakness (generalized): Secondary | ICD-10-CM | POA: Diagnosis not present

## 2020-01-05 MED ORDER — TRIAMCINOLONE ACETONIDE 40 MG/ML IJ SUSP
40.0000 mg | Freq: Once | INTRAMUSCULAR | Status: AC
  Administered 2020-01-05: 40 mg via INTRA_ARTICULAR

## 2020-01-05 MED ORDER — METHYLPREDNISOLONE ACETATE 40 MG/ML IJ SUSP: 40.0000 mg | Freq: Once | INTRAMUSCULAR | Status: DC

## 2020-01-05 NOTE — Progress Notes (Signed)
Sydney Kesinger T. Buna Cuppett, MD, [place] [facility] [place], [address]  Phone: [phone]  FAX: [phone]  Sydney Ortiz - 74 y.o. female  MRN [id number]  Date of Birth: Jan 12, 1946  Date: 01/05/2020  PCP: Jinny Sanders, MD  Referral: Jinny Sanders, MD  Chief Complaint  Patient presents with  . Knee Pain    Right    This visit occurred during the SARS-CoV-2 public health emergency.  Safety protocols were in place, including screening questions prior to the visit, additional usage of staff PPE, and extensive cleaning of exam room while observing appropriate contact time as indicated for disinfecting solutions.   Subjective:   Sydney Ortiz is a 74 y.o. very pleasant female patient with Body mass index is 26.44 kg/m. who presents with the following:  Sydney Ortiz is a very nice young lady who appears younger than her stated age.  She is very active and works out almost every day, and she enjoys working out particularly with [employer].  Plain films of the right knee done on December 26, 2019 are independently reviewed and she does have some tricompartmental osteoarthritis most visualized at the [place] view.  This is most appreciable at the medial compartment and to a lesser extent of the patellofemoral compartment.  She does have some ongoing knee pain, acute on chronic with particularly worsening in the last month.  She is having pain that is limiting her walking for exercise to a degree.  She has not had any mechanical symptoms or functional giving way.  She does not have any history of significant trauma and she has never had any prior knee surgery.  She does not limit her NSAID use secondary to polycystic kidney disease.  Review of Systems is noted in the HPI, as appropriate   Objective:   BP 102/62   Pulse 68   Temp 98 F (36.7 C) (Temporal)   Ht 5\' 3"  (1.6 m)   Wt 149 lb 4  oz (67.7 kg)   SpO2 94%   BMI 26.44 kg/m   Right knee: Full extension and flexion to 110 degrees.  She does have a mild effusion.  She has had pain at the medial lateral patellar facets.  Medial joint line tenderness is the greatest with minimal at the lateral.  All ligamentous structures are intact.  McMurray's does produce some pain without mechanical symptoms.  This is also true for flexion pinch and no significant pain with bounce home testing.  She does have some pain when rising from a seated position and getting on the examination table.  Most notably, the patient's right leg does have a noticeably smaller quadricep.  Radiology: DG Knee 4 Views W/Patella Right  Result Date: 12/29/2019 CLINICAL DATA:  Right knee pain. EXAM: RIGHT KNEE - COMPLETE 4+ VIEW COMPARISON:  None. FINDINGS: Imaging obtained weight-bearing. There is mild medial tibiofemoral joint space narrowing. Mild tricompartmental peripheral spurring. Patella is normally situated in the trochlear groove. No fracture, erosion, periosteal reaction or evidence of focal bone lesion. Contralateral left knee is included on the AP view for comparison, mild spurring in the medial compartment. No focal soft tissue abnormality. IMPRESSION: Mild tricompartmental osteoarthritis, most prominent in the medial tibiofemoral compartment. Electronically Signed   By: Keith Rake M.D.   On: 12/29/2019 01:38    Assessment and Plan:     ICD-10-CM   1. Primary osteoarthritis of right knee  M17.11 triamcinolone acetonide (KENALOG-40) injection 40 mg    DISCONTINUED: methylPREDNISolone acetate (DEPO-MEDROL) injection 40 mg  2. Quadriceps weakness  M62.81    Osteoarthritis with acute exacerbation.  This is been particularly true over the last month.  I think that Tylenol would be a better choice for her given her polycystic kidney disease.  She is very active, and I gave her a comprehensive quadricep and knee rehab program in the setting of  osteoarthritis.  She also has access to a very nice gym at her [facility].  I gave her some recommendations so that the head teacher can also work with her.  We will inject her knee with some corticosteroid today, and hopefully this will give her some pain relief so she can work on her right-sided quadriceps.  Aspiration/Injection Procedure Note Sydney Ortiz [birth year] Date of procedure: 01/05/2020  Procedure: Large Joint Aspiration / Injection of Knee, R Indications: Pain  Procedure Details Patient verbally consented to procedure. Risks (including potential rare risk of infection), benefits, and alternatives explained. Sterilely prepped with Chloraprep. Ethyl cholride used for anesthesia. 9 cc Lidocaine 1% mixed with 1 mL of Kenalog 40 mg injected using the anteromedial approach without difficulty. No complications with procedure and tolerated well. Patient had decreased pain post-injection. Medication: 1 mL of Kenalog 40 mg  Meds ordered this encounter  Medications  . DISCONTD: methylPREDNISolone acetate (DEPO-MEDROL) injection 40 mg  . triamcinolone acetonide (KENALOG-40) injection 40 mg   Medications Discontinued During This Encounter  Medication Reason  . methylPREDNISolone acetate (DEPO-MEDROL) injection 40 mg    No orders of the defined types were placed in this encounter.   Follow-up: No follow-ups on file.  Signed,  Maud Deed. Aniyah Nobis, MD   Outpatient Encounter Medications as of 01/05/2020  Medication Sig  . acetaminophen (TYLENOL 8 HOUR ARTHRITIS PAIN) 650 MG CR tablet Take 650 mg by mouth every 8 (eight) hours as needed for pain.  . diphenhydramine-acetaminophen (TYLENOL PM) 25-500 MG TABS Take 1 tablet by mouth at bedtime as needed.  . fexofenadine (ALLEGRA) 60 MG tablet Take 60 mg by mouth daily.   . fluticasone (FLONASE) 50 MCG/ACT nasal spray Place 2 sprays into both nostrils daily.  . hyoscyamine (ANASPAZ) 0.125 MG TBDP Place 0.125 mg under the tongue as needed.      . Multiple Vitamins-Minerals (MULTIVITAMIN ADULT PO) Take by mouth.  Marland Kitchen omeprazole (PRILOSEC) 20 MG capsule Take 20 mg by mouth 2 (two) times daily.  Marland Kitchen OVER THE COUNTER MEDICATION Micro Plex MVP one tablet by mouth daily  . Probiotic Product (PROBIOTIC-10 PO) Take by mouth.  Marland Kitchen atorvastatin (LIPITOR) 10 MG tablet Take 1 tablet (10 mg total) by mouth daily. (Patient not taking: Reported on 01/05/2020)  . [EXPIRED] triamcinolone acetonide (KENALOG-40) injection 40 mg   . [DISCONTINUED] methylPREDNISolone acetate (DEPO-MEDROL) injection 40 mg    No facility-administered encounter medications on file as of 01/05/2020.

## 2020-01-19 ENCOUNTER — Other Ambulatory Visit: Payer: Self-pay | Admitting: Orthopedic Surgery

## 2020-01-19 DIAGNOSIS — M84361A Stress fracture, right tibia, initial encounter for fracture: Secondary | ICD-10-CM

## 2020-01-19 DIAGNOSIS — M1711 Unilateral primary osteoarthritis, right knee: Secondary | ICD-10-CM

## 2020-01-31 ENCOUNTER — Other Ambulatory Visit: Payer: Self-pay

## 2020-01-31 ENCOUNTER — Ambulatory Visit
Admission: RE | Admit: 2020-01-31 | Discharge: 2020-01-31 | Disposition: A | Discharge status: Home / Self Care | Payer: Medicare Other | Source: Ambulatory Visit | Attending: Orthopedic Surgery | Admitting: Orthopedic Surgery

## 2020-01-31 DIAGNOSIS — M1711 Unilateral primary osteoarthritis, right knee: Secondary | ICD-10-CM | POA: Diagnosis not present

## 2020-01-31 DIAGNOSIS — M84361A Stress fracture, right tibia, initial encounter for fracture: Secondary | ICD-10-CM | POA: Insufficient documentation

## 2020-02-03 ENCOUNTER — Other Ambulatory Visit: Payer: Self-pay | Admitting: Orthopedic Surgery

## 2020-02-10 ENCOUNTER — Other Ambulatory Visit
Admission: RE | Admit: 2020-02-10 | Discharge: 2020-02-10 | Disposition: A | Discharge status: Home / Self Care | Payer: Medicare Other | Source: Ambulatory Visit | Attending: Orthopedic Surgery | Admitting: Orthopedic Surgery

## 2020-02-10 ENCOUNTER — Other Ambulatory Visit: Payer: Self-pay

## 2020-02-10 HISTORY — DX: Nausea with vomiting, unspecified: Z98.890

## 2020-02-10 HISTORY — DX: Hepatomegaly, not elsewhere classified: R16.0

## 2020-02-10 HISTORY — DX: Nausea with vomiting, unspecified: R11.2

## 2020-02-10 HISTORY — DX: Prediabetes: R73.03

## 2020-02-10 HISTORY — DX: Family history of other specified conditions: Z84.89

## 2020-02-10 HISTORY — DX: Carpal tunnel syndrome, unspecified upper limb: G56.00

## 2020-02-10 NOTE — Patient Instructions (Addendum)
Your procedure is scheduled on: Thursday, December 9 Report to the Registration Desk on the 1st floor of the [facility]. To find out your arrival time, please call [phone] between 1PM - 3PM on: Wednesday, December 8  REMEMBER: Instructions that are not followed completely may result in serious medical risk, up to and including death; or upon the discretion of your surgeon and anesthesiologist your surgery may need to be rescheduled.  Do not eat food after midnight the night before surgery.  No gum chewing, lozengers or hard candies.  You may however, drink CLEAR liquids up to 2 hours before you are scheduled to arrive for your surgery. Do not drink anything within 2 hours of your scheduled arrival time.  Clear liquids include: - water  - apple juice without pulp - gatorade (not RED, PURPLE, OR BLUE) - black coffee or tea (Do NOT add milk or creamers to the coffee or tea) Do NOT drink anything that is not on this list.  In addition, your doctor has ordered for you to drink the provided  Ensure Pre-Surgery Clear Carbohydrate Drink  Drinking this carbohydrate drink up to two hours before surgery helps to reduce insulin resistance and improve patient outcomes. Please complete drinking 2 hours prior to scheduled arrival time.  TAKE THESE MEDICATIONS THE MORNING OF SURGERY WITH A SIP OF WATER:  1.  Atorvastatin 2.  Omeprazole - (take one the night before and one on the morning of surgery - helps to prevent nausea after surgery.)  One week prior to surgery: Stop Anti-inflammatories (NSAIDS) such as Advil, Aleve, Ibuprofen, Motrin, Naproxen, Naprosyn and Aspirin based products such as Excedrin, Goodys Powder, BC Powder. Stop ANY OVER THE COUNTER supplements until after surgery. (However, you may continue taking multivitamin up until the day before surgery.)  No Alcohol for 24 hours before or after surgery.  No Smoking including e-cigarettes for 24 hours prior to surgery.  No  chewable tobacco products for at least 6 hours prior to surgery.  No nicotine patches on the day of surgery.  On the morning of surgery brush your teeth with toothpaste and water, you may rinse your mouth with mouthwash if you wish. Do not swallow any toothpaste or mouthwash.  Do not wear jewelry, make-up, hairpins, clips or nail polish.  Do not wear lotions, powders, or perfumes.   Do not shave body from the neck down 48 hours prior to surgery just in case you cut yourself which could leave a site for infection.  Also, freshly shaved skin may become irritated if using the CHG soap.  Contact lenses, hearing aids and dentures may not be worn into surgery.  Do not bring valuables to the hospital. [facility] is not responsible for any missing/lost belongings or valuables.   Use CHG Soap as directed on instruction sheet.  Notify your doctor if there is any change in your medical condition (cold, fever, infection).  Wear comfortable clothing (specific to your surgery type) to the hospital.  Plan for stool softeners for home use; pain medications have a tendency to cause constipation. You can also help prevent constipation by eating foods high in fiber such as fruits and vegetables and drinking plenty of fluids as your diet allows.  After surgery, you can help prevent lung complications by doing breathing exercises.  Take deep breaths and cough every 1-2 hours. Your doctor may order a device called an Incentive Spirometer to help you take deep breaths.  If you are being discharged the day  of surgery, you will not be allowed to drive home. You will need a responsible adult (18 years or older) to drive you home and stay with you that night.   If you are taking public transportation, you will need to have a responsible adult (18 years or older) with you. Please confirm with your physician that it is acceptable to use public transportation.   Please call the [place] Dept. at  [phone] if you have any questions about these instructions.  Visitation Policy:  Patients undergoing a surgery or procedure may have one family member or support person with them as long as that person is not COVID-19 positive or experiencing its symptoms.  That person may remain in the waiting area during the procedure.

## 2020-02-11 ENCOUNTER — Encounter
Admission: RE | Admit: 2020-02-11 | Discharge: 2020-02-11 | Disposition: A | Discharge status: Home / Self Care | Payer: Medicare Other | Source: Ambulatory Visit | Attending: Orthopedic Surgery | Admitting: Orthopedic Surgery

## 2020-02-11 ENCOUNTER — Other Ambulatory Visit: Payer: Medicare Other

## 2020-02-11 DIAGNOSIS — Z20822 Contact with and (suspected) exposure to covid-19: Secondary | ICD-10-CM | POA: Diagnosis not present

## 2020-02-11 DIAGNOSIS — I471 Supraventricular tachycardia: Secondary | ICD-10-CM | POA: Diagnosis not present

## 2020-02-11 DIAGNOSIS — Z01818 Encounter for other preprocedural examination: Secondary | ICD-10-CM | POA: Insufficient documentation

## 2020-02-11 LAB — BASIC METABOLIC PANEL
Anion gap: 10 (ref 5–15)
BUN: 16 mg/dL (ref 8–23)
CO2: 25 mmol/L (ref 22–32)
Calcium: 9.4 mg/dL (ref 8.9–10.3)
Chloride: 106 mmol/L (ref 98–111)
Creatinine, Ser: 0.61 mg/dL (ref 0.44–1.00)
GFR, Estimated: >60 mL/min (ref >60)
Glucose, Bld: 90 mg/dL (ref 70–99)
Potassium: 3.6 mmol/L (ref 3.5–5.1)
Sodium: 141 mmol/L (ref 135–145)

## 2020-02-11 LAB — CBC
HCT: 41.6 % (ref 36.0–46.0)
Hemoglobin: 13.9 g/dL (ref 12.0–15.0)
MCH: 31.3 pg (ref 26.0–34.0)
MCHC: 33.4 g/dL (ref 30.0–36.0)
MCV: 93.7 fL (ref 80.0–100.0)
Platelets: 307 10*3/uL (ref 150–400)
RBC: 4.44 MIL/uL (ref 3.87–5.11)
RDW: 14.9 % (ref 11.5–15.5)
WBC: 6 10*3/uL (ref 4.0–10.5)
nRBC: 0 % (ref 0.0–0.2)

## 2020-02-12 ENCOUNTER — Ambulatory Visit
Admission: RE | Admit: 2020-02-12 | Discharge: 2020-02-12 | Disposition: A | Discharge status: Home / Self Care | Payer: Medicare Other | Attending: Orthopedic Surgery | Admitting: Orthopedic Surgery

## 2020-02-12 ENCOUNTER — Encounter: Payer: Self-pay | Admitting: Orthopedic Surgery

## 2020-02-12 ENCOUNTER — Encounter
Admission: RE | Disposition: A | Discharge status: Home / Self Care | Payer: Self-pay | Source: Home / Self Care | Attending: Orthopedic Surgery

## 2020-02-12 ENCOUNTER — Ambulatory Visit: Payer: Medicare Other | Admitting: Urgent Care

## 2020-02-12 ENCOUNTER — Other Ambulatory Visit: Payer: Self-pay

## 2020-02-12 DIAGNOSIS — M1711 Unilateral primary osteoarthritis, right knee: Secondary | ICD-10-CM | POA: Insufficient documentation

## 2020-02-12 DIAGNOSIS — S83231A Complex tear of medial meniscus, current injury, right knee, initial encounter: Secondary | ICD-10-CM | POA: Insufficient documentation

## 2020-02-12 DIAGNOSIS — Z79899 Other long term (current) drug therapy: Secondary | ICD-10-CM | POA: Diagnosis not present

## 2020-02-12 DIAGNOSIS — X58XXXA Exposure to other specified factors, initial encounter: Secondary | ICD-10-CM | POA: Insufficient documentation

## 2020-02-12 HISTORY — PX: KNEE ARTHROSCOPY WITH MEDIAL MENISECTOMY: SHX5651

## 2020-02-12 LAB — SARS CORONAVIRUS 2 (TAT 6-24 HRS): SARS Coronavirus 2: NEGATIVE

## 2020-02-12 SURGERY — ARTHROSCOPY, KNEE, WITH MEDIAL MENISCECTOMY
Anesthesia: General | Site: Knee | Laterality: Right

## 2020-02-12 MED ORDER — ONDANSETRON HCL 4 MG/2ML IJ SOLN
INTRAMUSCULAR | Status: DC | PRN
  Administered 2020-02-12: 4 mg via INTRAVENOUS

## 2020-02-12 MED ORDER — HYDROCODONE-ACETAMINOPHEN 5-325 MG PO TABS: 1.0000 | ORAL_TABLET | Freq: Four times a day (QID) | ORAL | 0 refills | Status: DC | PRN

## 2020-02-12 MED ORDER — PROPOFOL 500 MG/50ML IV EMUL
INTRAVENOUS | Status: AC
  Filled 2020-02-12: qty 50

## 2020-02-12 MED ORDER — LACTATED RINGERS IV SOLN: INTRAVENOUS | Status: DC | PRN

## 2020-02-12 MED ORDER — LIDOCAINE HCL (CARDIAC) PF 100 MG/5ML IV SOSY
PREFILLED_SYRINGE | INTRAVENOUS | Status: DC | PRN
  Administered 2020-02-12: 100 mg via INTRAVENOUS

## 2020-02-12 MED ORDER — PROPOFOL 10 MG/ML IV BOLUS
INTRAVENOUS | Status: DC | PRN
  Administered 2020-02-12: 150 mg via INTRAVENOUS
  Administered 2020-02-12: 50 mg via INTRAVENOUS

## 2020-02-12 MED ORDER — FENTANYL CITRATE (PF) 100 MCG/2ML IJ SOLN
25.0000 ug | INTRAMUSCULAR | Status: DC | PRN
  Administered 2020-02-12 (×2): 25 ug via INTRAVENOUS

## 2020-02-12 MED ORDER — DEXAMETHASONE SODIUM PHOSPHATE 10 MG/ML IJ SOLN
INTRAMUSCULAR | Status: DC | PRN
  Administered 2020-02-12: 10 mg via INTRAVENOUS

## 2020-02-12 MED ORDER — HYDROCODONE-ACETAMINOPHEN 5-325 MG PO TABS
ORAL_TABLET | ORAL | Status: AC
  Filled 2020-02-12: qty 1

## 2020-02-12 MED ORDER — ROCURONIUM BROMIDE 10 MG/ML (PF) SYRINGE
PREFILLED_SYRINGE | INTRAVENOUS | Status: AC
  Filled 2020-02-12: qty 10

## 2020-02-12 MED ORDER — HYDROCODONE-ACETAMINOPHEN 5-325 MG PO TABS
1.0000 | ORAL_TABLET | Freq: Once | ORAL | Status: AC
  Administered 2020-02-12: 1 via ORAL

## 2020-02-12 MED ORDER — PROPOFOL 500 MG/50ML IV EMUL
INTRAVENOUS | Status: DC | PRN
  Administered 2020-02-12: 150 ug/kg/min via INTRAVENOUS

## 2020-02-12 MED ORDER — FENTANYL CITRATE (PF) 100 MCG/2ML IJ SOLN
INTRAMUSCULAR | Status: AC
  Administered 2020-02-12: 25 ug via INTRAVENOUS
  Filled 2020-02-12: qty 2

## 2020-02-12 MED ORDER — CHLORHEXIDINE GLUCONATE 0.12 % MT SOLN: 15.0000 mL | Freq: Once | OROMUCOSAL | Status: AC

## 2020-02-12 MED ORDER — ONDANSETRON HCL 4 MG/2ML IJ SOLN: 4.0000 mg | Freq: Once | INTRAMUSCULAR | Status: DC | PRN

## 2020-02-12 MED ORDER — SODIUM CHLORIDE (PF) 0.9 % IJ SOLN
INTRAMUSCULAR | Status: AC
  Filled 2020-02-12: qty 10

## 2020-02-12 MED ORDER — PHENYLEPHRINE HCL (PRESSORS) 10 MG/ML IV SOLN
INTRAVENOUS | Status: DC | PRN
  Administered 2020-02-12: 100 ug via INTRAVENOUS

## 2020-02-12 MED ORDER — FENTANYL CITRATE (PF) 100 MCG/2ML IJ SOLN
INTRAMUSCULAR | Status: DC | PRN
  Administered 2020-02-12: 50 ug via INTRAVENOUS

## 2020-02-12 MED ORDER — ORAL CARE MOUTH RINSE: 15.0000 mL | Freq: Once | OROMUCOSAL | Status: AC

## 2020-02-12 MED ORDER — FENTANYL CITRATE (PF) 100 MCG/2ML IJ SOLN
INTRAMUSCULAR | Status: AC
  Filled 2020-02-12: qty 2

## 2020-02-12 MED ORDER — CHLORHEXIDINE GLUCONATE 0.12 % MT SOLN
OROMUCOSAL | Status: AC
  Administered 2020-02-12: 15 mL via OROMUCOSAL
  Filled 2020-02-12: qty 15

## 2020-02-12 MED ORDER — SUCCINYLCHOLINE CHLORIDE 200 MG/10ML IV SOSY
PREFILLED_SYRINGE | INTRAVENOUS | Status: AC
  Filled 2020-02-12: qty 10

## 2020-02-12 MED ORDER — LACTATED RINGERS IV SOLN: INTRAVENOUS | Status: DC

## 2020-02-12 MED ORDER — SCOPOLAMINE 1 MG/3DAYS TD PT72
MEDICATED_PATCH | TRANSDERMAL | Status: AC
  Administered 2020-02-12: 1.5 mg via TRANSDERMAL
  Filled 2020-02-12: qty 1

## 2020-02-12 MED ORDER — BUPIVACAINE-EPINEPHRINE (PF) 0.5% -1:200000 IJ SOLN
INTRAMUSCULAR | Status: AC
  Filled 2020-02-12: qty 30

## 2020-02-12 MED ORDER — SCOPOLAMINE 1 MG/3DAYS TD PT72: 1.0000 | MEDICATED_PATCH | Freq: Once | TRANSDERMAL | Status: DC

## 2020-02-12 SURGICAL SUPPLY — 31 items
APL PRP STRL LF DISP 70% ISPRP (MISCELLANEOUS) ×1
BLADE INCISOR PLUS 4.5 (BLADE) IMPLANT
BNDG ELASTIC 4X5.8 VLCR STR LF (GAUZE/BANDAGES/DRESSINGS) IMPLANT
CHLORAPREP W/TINT 26 (MISCELLANEOUS) ×2 IMPLANT
COVER WAND RF STERILE (DRAPES) ×2 IMPLANT
CUFF TOURN SGL QUICK 24 (TOURNIQUET CUFF)
CUFF TOURN SGL QUICK 30 (TOURNIQUET CUFF)
CUFF TRNQT CYL 24X4X16.5-23 (TOURNIQUET CUFF) IMPLANT
CUFF TRNQT CYL 30X4X21-28X (TOURNIQUET CUFF) IMPLANT
DRAPE C-ARMOR (DRAPES) IMPLANT
GAUZE SPONGE 4X4 12PLY STRL (GAUZE/BANDAGES/DRESSINGS) ×2 IMPLANT
GLOVE SURG SYN 9.0  PF PI (GLOVE) ×2
GLOVE SURG SYN 9.0 PF PI (GLOVE) ×1 IMPLANT
GOWN SRG 2XL LVL 4 RGLN SLV (GOWNS) ×1 IMPLANT
GOWN STRL NON-REIN 2XL LVL4 (GOWNS) ×2
GOWN STRL REUS W/ TWL LRG LVL3 (GOWN DISPOSABLE) ×2 IMPLANT
GOWN STRL REUS W/TWL LRG LVL3 (GOWN DISPOSABLE) ×4
IV LACTATED RINGER IRRG 3000ML (IV SOLUTION) ×4
IV LR IRRIG 3000ML ARTHROMATIC (IV SOLUTION) ×2 IMPLANT
KIT TURNOVER KIT A (KITS) ×2 IMPLANT
MANIFOLD NEPTUNE II (INSTRUMENTS) ×4 IMPLANT
NEEDLE HYPO 22GX1.5 SAFETY (NEEDLE) ×2 IMPLANT
PACK ARTHROSCOPY KNEE (MISCELLANEOUS) ×2 IMPLANT
SCALPEL PROTECTED #11 DISP (BLADE) ×2 IMPLANT
SET TUBE SUCT SHAVER OUTFL 24K (TUBING) ×2 IMPLANT
SET TUBE TIP INTRA-ARTICULAR (MISCELLANEOUS) ×2 IMPLANT
SUT ETHILON 4-0 (SUTURE) ×2
SUT ETHILON 4-0 FS2 18XMFL BLK (SUTURE) ×1
SUTURE ETHLN 4-0 FS2 18XMF BLK (SUTURE) ×1 IMPLANT
TUBING ARTHRO INFLOW-ONLY STRL (TUBING) ×2 IMPLANT
WAND COBLATION FLOW 50 (SURGICAL WAND) ×2 IMPLANT

## 2020-02-12 NOTE — Discharge Instructions (Addendum)
Baby your knee through the weekend should not walk more than the length of a normal driveway.  Ice to the knee today and tomorrow may help with swelling and pain Resume all your normal medications. Pain medicine as directed. Loosen Ace wrap if your leg swells but hopefully should stay in place until your return visit Monday. Call office if you are having problems   AMBULATORY SURGERY  DISCHARGE INSTRUCTIONS   1) The drugs that you were given will stay in your system until tomorrow so for the next 24 hours you should not:  A) Drive an automobile B) Make any legal decisions C) Drink any alcoholic beverage   2) You may resume regular meals tomorrow.  Today it is better to start with liquids and gradually work up to solid foods.  You may eat anything you prefer, but it is better to start with liquids, then soup and crackers, and gradually work up to solid foods.   3) Please notify your doctor immediately if you have any unusual bleeding, trouble breathing, redness and pain at the surgery site, drainage, fever, or pain not relieved by medication.   4) Additional Instructions: Main hospital number [phone]

## 2020-02-12 NOTE — OR Nursing (Signed)
Dispensed crutches and instructed on use.  Booklet on crutch use given to patient.

## 2020-02-12 NOTE — Transfer of Care (Signed)
Immediate Anesthesia Transfer of Care Note  Patient: Sydney Ortiz  Procedure(s) Performed: Right knee partial medial menisectomy (Right Knee)  Patient Location: PACU  Anesthesia Type:General  Level of Consciousness: awake, drowsy and patient cooperative  Airway & Oxygen Therapy: Patient Spontanous Breathing and Patient connected to face mask oxygen  Post-op Assessment: Report given to RN and Post -op Vital signs reviewed and stable  Post vital signs: Reviewed and stable  Last Vitals:  Vitals Value Taken Time  BP 124/72 02/12/20 1354  Temp    Pulse 73 02/12/20 1357  Resp 12 02/12/20 1357  SpO2 100 % 02/12/20 1357  Vitals shown include unvalidated device data.  Last Pain:  Vitals:   02/12/20 1200  TempSrc: Tympanic  PainSc: 0-No pain         Complications: No complications documented.

## 2020-02-12 NOTE — Anesthesia Preprocedure Evaluation (Signed)
Anesthesia Evaluation  Patient identified by MRN, date of birth, ID band Patient awake    Reviewed: Allergy & Precautions, NPO status , Patient's Chart, lab work & pertinent test results  History of Anesthesia Complications (+) PONV and history of anesthetic complications  Airway Mallampati: II       Dental   Pulmonary asthma (many years ago, no problems since) , neg sleep apnea, neg COPD, Not current smoker, former smoker,           Cardiovascular (-) hypertension(-) Past MI and (-) CHF (-) dysrhythmias (-) Valvular Problems/Murmurs     Neuro/Psych neg Seizures    GI/Hepatic Neg liver ROS, GERD  Medicated and Controlled,  Endo/Other  neg diabetes  Renal/GU Renal disease (polycycstic kidney dz)     Musculoskeletal   Abdominal   Peds  Hematology   Anesthesia Other Findings   Reproductive/Obstetrics                             Anesthesia Physical Anesthesia Plan  ASA: III  Anesthesia Plan: General   Post-op Pain Management:    Induction: Intravenous  PONV Risk Score and Plan: 4 or greater and Ondansetron, Dexamethasone, Midazolam and Treatment may vary due to age or medical condition  Airway Management Planned: LMA  Additional Equipment:   Intra-op Plan:   Post-operative Plan:   Informed Consent: I have reviewed the patients History and Physical, chart, labs and discussed the procedure including the risks, benefits and alternatives for the proposed anesthesia with the patient or authorized representative who has indicated his/her understanding and acceptance.       Plan Discussed with:   Anesthesia Plan Comments:         Anesthesia Quick Evaluation

## 2020-02-12 NOTE — H&P (Signed)
Chief Complaint  Patient presents with  . Establish Care  Rt Knee MRI done    History of the Present Illness: Sydney Ortiz is a 74 y.o. female here today for repeat evaluation of right knee pain. I saw her on 01/19/2020. We reviewed her right knee MRI, which revealed significant lateral compartment arthritis, radiographically, but there is concern for other injury, including bone marrow injury. I previously consulted with her on 01/19/2020.   The patient states her right knee is painful. She denies any pain with ambulation. She endorses taking Tylenol for pain relief and wearing a knee sleeve. The patient notes she would like to proceed with right knee arthroscopy.   I have reviewed past medical, surgical, social and family history, and allergies as documented in the EMR.  Past Medical History: Past Medical History:  Diagnosis Date  . Barrett's esophagus without dysplasia 03/29/2017  EGD 09/2016: C0M1 without clear dysplasia. Plan repeat in one year.  . Cancer (CMS-HCC) 2000  Right Breast removed  . Carpal tunnel syndrome  . Congenital polycystic kidney 04/10/2007  Overview: Qualifier: Diagnosis of By: Diona Browner MD, Amy Last Assessment & Plan: Stable per Dr. Zara Chess.  . Esophageal dysmotility  Weak peristalsis on Manometry 07/2008  . GERD (gastroesophageal reflux disease)  Impedance testing 07/2008  . History of Clostridium difficile infection 08/13/2014  Dx: 02/2014  . History of elevated glucose  . Hypercholesteremia  . Microscopic colitis - Lymphocytic colitis 09/17/2014  Colonoscopy 09/2014  . Polycystic kidney disease  Followed by Dr. Ambrose Pancoast   Past Surgical History: Past Surgical History:  Procedure Laterality Date  . APPENDECTOMY  . BREAST RECONSTRUCTION 2012  . BREAST SURGERY 1994  right mastectomy  . CARPAL TUNNEL RELEASE  . CATARACT EXTRACTION Bilateral  2012 AND 2014  . COLONOSCOPY N/A 09/19/2013  Procedure: Colonoscopy at [facility]; Surgeon: Abelardo Diesel Sherri Sear., MD; Location: [facility]; Service: Gastroenterology; Laterality: N/A;  . COLONOSCOPY 2016  . HYSTERECTOMY  . KIDNEY SURGERY  . MASTECTOMY  . ROTATOR CUFF REPAIR  . TONSILLECTOMY  . upper endoscopy 2010  . wrist operation Left  2017, 2018 (plates removed)   Past Family History: Family History  Problem Relation Age of Onset  . Colon cancer Neg Hx  . Colon polyps Neg Hx   Medications: Current Outpatient Medications Ordered in Epic  Medication Sig Dispense Refill  . acetaminophen (TYLENOL) 650 MG ER tablet Take 650 mg by mouth every 8 (eight) hours as needed  . ACETAMINOPHEN/DIPHENHYDRAMINE (TYLENOL PM ORAL) Take 500 mg by mouth.  . fexofenadine (ALLEGRA ALLERGY) 180 MG tablet  . fluticasone (FLONASE) 50 mcg/actuation nasal spray by Nasal route as needed.  Marland Kitchen LACTOBAC NO.41/BIFIDOBACT NO.7 (PROBIOTIC-10 ORAL) Take 2 tablets by mouth every evening.  . multivitamin tablet Take 1 tablet by mouth once daily.  Marland Kitchen omeprazole (PRILOSEC) 20 MG DR capsule TAKE 1 CAPSULE TWICE DAILY 180 capsule 3  . atorvastatin (LIPITOR) 10 MG tablet atorvastatin 10 mg tablet (Patient not taking: Reported on 02/02/2020)  . HYDROcodone-acetaminophen (NORCO) 5-325 mg tablet Take 1 tablet by mouth every 6 (six) hours as needed for up to 12 doses (Patient not taking: Reported on 01/19/2020 ) 12 tablet 0   No current Epic-ordered facility-administered medications on file.   Allergies: No Known Allergies   Body mass index is 26 kg/m.  Review of Systems: A comprehensive 14 point ROS was performed, reviewed, and the pertinent orthopaedic findings are documented in the HPI.  Vitals:  02/02/20 1311  BP: 130/78  General Physical Examination:  General/Constitutional: No apparent distress: well-nourished and well developed. Eyes: Pupils equal, round with synchronous movement. Lungs: Clear to auscultation HEENT: Normal Vascular: No edema, swelling or tenderness, except as noted in detailed  exam. Cardiac: Heart rate and rhythm is regular. Integumentary: No impressive skin lesions present, except as noted in detailed exam. Neuro/Psych: Normal mood and affect, oriented to person, place and time.  Musculoskeletal Examination: On exam, lungs are clear. Heart rate and rhythm are normal. HEENT is normal. Positive medial McMurray's. Full extension. Flexion to 110 degrees with medial joint line tenderness. No crepitation.   Radiographs: MRI was reviewed, which revealed significant lateral compartment arthritis, radiographically, but there is concern for other injury, including bone marrow injury. There is evidence of complete tear of the medial meniscus.   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   Assessment: ICD-10-CM  1. Complex tear of medial meniscus of right knee as current injury, subsequent encounter S83.231D   Plan: The patient has clinical findings of right knee lateral compartment arthritis.   We discussed the patient's prior x-ray and MRI findings. I explained she has a bone bruise on the medial aspect of her right knee, but no evidence of a stress fracture. I explained she has a complete tear of the medial meniscus, as well as arthritis. We discussed the risks and benefits of right knee arthroscopy in detail and provided the patient with a brochure on knee arthroscopy. She would like to proceed with right knee arthroscopy. The patient will follow up as needed.   Attestation: Cristy Friedlander, am documenting for [facility], MD utilizing [place].     Electronically signed by Lauris Poag, MD at 02/03/2020 7:27 PM EST   Reviewed  H+P. No changes noted.

## 2020-02-12 NOTE — Op Note (Signed)
02/12/2020  1:49 PM  PATIENT:  Sydney Ortiz  74 y.o. female  PRE-OPERATIVE DIAGNOSIS:  Complex tear of medial meniscus of right knee as current injury, subsequent encounter S83.231D  POST-OPERATIVE DIAGNOSIS:  Complex tear of medial meniscus of right knee as current injury, and plica band  PROCEDURE:  Procedure(s): Right knee partial medial menisectomy (Right) with plica excision/partial synovectomy  SURGEON: Laurene Footman, MD  ASSISTANTS: None  ANESTHESIA:   general  EBL:  Total I/O In: 500 [I.V.:500] Out: -   BLOOD ADMINISTERED:none  DRAINS: none   LOCAL MEDICATIONS USED:  MARCAINE     SPECIMEN:  No Specimen  DISPOSITION OF SPECIMEN:  N/A  COUNTS:  YES  TOURNIQUET:   No tourniquet used  IMPLANTS: None  DICTATION: .Dragon Dictation patient was brought to the operating room and after adequate anesthesia was obtained the right leg was prepped and draped in the usual sterile fashion after applying but not requiring the tourniquet and arthroscopic leg holder.  After patient identification and timeout procedure were completed inferior lateral portals made arthroscope was introduced showing severe patellofemoral degenerative changes with approximately half the patella surface showing exposed bone as well as fibrillated cartilage.  Trochlea also had involvement there was a thick plica band along the medial gutter impinging on the joint as well.,  The medial compartment inferior medial portal was made and on probing there is a complex tear consistent with the MRI findings radial tear at the junction of the middle and posterior thirds with a horizontal tear and most of the posterior horn this was subsequently debrided with a shaver and ArthroCare wand to a stable margin there was moderate degenerative changes with fibrillated cartilage but no exposed bone ACL is intact going the lateral compartment the articular cartilage better than it did on the MRI with no exposed bone and meniscus  intact gutters were checked and did not have a loose bodies after addressing the medial meniscus tear shaver was used to debride the plica and the ArthroCare wand was used to aid in hemostasis.  The knee was thoroughly irrigated and the 2 incisions infiltrated with 10 cc of half percent Sensorcaine plain followed by application of dressing Xeroform 4 x 4's web roll and Ace wrap  PLAN OF CARE: Discharge to home after PACU  PATIENT DISPOSITION:  PACU - hemodynamically stable.

## 2020-02-12 NOTE — Anesthesia Procedure Notes (Signed)
Procedure Name: LMA Insertion Date/Time: 02/12/2020 1:14 PM Performed by: Lia Foyer, CRNA Pre-anesthesia Checklist: Patient identified, Emergency Drugs available, Suction available and Patient being monitored Patient Re-evaluated:Patient Re-evaluated prior to induction Oxygen Delivery Method: Circle system utilized Preoxygenation: Pre-oxygenation with 100% oxygen Induction Type: IV induction Ventilation: Oral airway inserted - appropriate to patient size and Mask ventilation without difficulty LMA: LMA flexible inserted LMA Size: 4.0 Number of attempts: 2 Placement Confirmation: positive ETCO2 and breath sounds checked- equal and bilateral Tube secured with: Tape Dental Injury: Teeth and Oropharynx as per pre-operative assessment

## 2020-02-12 NOTE — Progress Notes (Signed)
Pt has been c/o substernal chest pain since coming into pacu. Pain has not gotten worse or improved.  significant reflux/esophageal hx.  Describes as burning.  Dr Ronelle Nigh notified.  EKG was done at 1407 and dr Ronelle Nigh to bedside to review EKG

## 2020-02-12 NOTE — Anesthesia Postprocedure Evaluation (Signed)
Anesthesia Post Note  Patient: Sydney Ortiz  Procedure(s) Performed: Right knee partial medial menisectomy (Right Knee)  Patient location during evaluation: PACU Anesthesia Type: General Level of consciousness: awake and alert Pain management: pain level controlled Vital Signs Assessment: post-procedure vital signs reviewed and stable Respiratory status: spontaneous breathing and respiratory function stable Cardiovascular status: stable Anesthetic complications: no   No complications documented.   Last Vitals:  Vitals:   02/12/20 1438 02/12/20 1554  BP: (!) 143/85 (!) 141/74  Pulse: (!) 59 76  Resp: 16 16  Temp: 36.8 C   SpO2: 98% 96%    Last Pain:  Vitals:   02/12/20 1554  TempSrc:   PainSc: 3                  Jerriyah Louis K

## 2020-02-13 ENCOUNTER — Encounter: Payer: Self-pay | Admitting: Orthopedic Surgery

## 2020-03-01 DIAGNOSIS — M25561 Pain in right knee: Secondary | ICD-10-CM | POA: Insufficient documentation

## 2020-03-01 NOTE — Assessment & Plan Note (Signed)
Eval with X-ray.  Not clearly referred from back but unusual right sided achy muscle pain pain. Increase topical voltaren to four times daily.  Hold atorvastatin for 1-2 weeks... if not improving restart.

## 2020-06-22 ENCOUNTER — Other Ambulatory Visit: Payer: Self-pay | Admitting: Family Medicine

## 2020-06-22 DIAGNOSIS — Z1231 Encounter for screening mammogram for malignant neoplasm of breast: Secondary | ICD-10-CM

## 2020-06-29 ENCOUNTER — Other Ambulatory Visit: Payer: Self-pay | Admitting: Family Medicine

## 2020-07-20 ENCOUNTER — Telehealth: Payer: Self-pay | Admitting: Family Medicine

## 2020-07-20 DIAGNOSIS — E782 Mixed hyperlipidemia: Secondary | ICD-10-CM

## 2020-07-20 NOTE — Telephone Encounter (Signed)
-----   Message from Ellamae Sia sent at 07/12/2020 10:45 AM EDT ----- Regarding: Lab orders for Tuesday, 5.24.22 Patient is scheduled for CPX labs, please order future labs, Thanks , Karna Christmas

## 2020-07-27 ENCOUNTER — Other Ambulatory Visit (INDEPENDENT_AMBULATORY_CARE_PROVIDER_SITE_OTHER): Payer: Medicare Other

## 2020-07-27 ENCOUNTER — Other Ambulatory Visit: Payer: Self-pay

## 2020-07-27 ENCOUNTER — Ambulatory Visit (INDEPENDENT_AMBULATORY_CARE_PROVIDER_SITE_OTHER): Payer: Medicare Other

## 2020-07-27 DIAGNOSIS — E782 Mixed hyperlipidemia: Secondary | ICD-10-CM

## 2020-07-27 DIAGNOSIS — Z0000 Encounter for general adult medical examination without abnormal findings: Secondary | ICD-10-CM | POA: Diagnosis not present

## 2020-07-27 LAB — LIPID PANEL
Cholesterol: 185 mg/dL (ref 0–200)
HDL: 66.6 mg/dL (ref >39.00)
LDL Cholesterol: 84 mg/dL (ref 0–99)
NonHDL: 118.52
Total CHOL/HDL Ratio: 3
Triglycerides: 175 mg/dL — ABNORMAL HIGH (ref 0.0–149.0)
VLDL: 35 mg/dL (ref 0.0–40.0)

## 2020-07-27 LAB — COMPREHENSIVE METABOLIC PANEL
ALT: 18 U/L (ref 0–35)
AST: 17 U/L (ref 0–37)
Albumin: 4.2 g/dL (ref 3.5–5.2)
Alkaline Phosphatase: 59 U/L (ref 39–117)
BUN: 12 mg/dL (ref 6–23)
CO2: 29 mEq/L (ref 19–32)
Calcium: 9.6 mg/dL (ref 8.4–10.5)
Chloride: 104 mEq/L (ref 96–112)
Creatinine, Ser: 0.68 mg/dL (ref 0.40–1.20)
GFR: 85.33 mL/min (ref >60.00)
Glucose, Bld: 91 mg/dL (ref 70–99)
Potassium: 3.8 mEq/L (ref 3.5–5.1)
Sodium: 141 mEq/L (ref 135–145)
Total Bilirubin: 0.5 mg/dL (ref 0.2–1.2)
Total Protein: 6.6 g/dL (ref 6.0–8.3)

## 2020-07-27 NOTE — Patient Instructions (Signed)
Sydney Ortiz , Thank you for taking time to come for your Medicare Wellness Visit. I appreciate your ongoing commitment to your health goals. Please review the following plan we discussed and let me know if I can assist you in the future.   Screening recommendations/referrals: Colonoscopy: Up to date, completed 09/11/2014, due 09/2024 Mammogram: Up to date, completed 08/28/2019, scheduled 08/30/2020 Bone Density: Up to date, completed 08/27/2017, due 2-5 years  Recommended yearly ophthalmology/optometry visit for glaucoma screening and checkup Recommended yearly dental visit for hygiene and checkup  Vaccinations: Influenza vaccine: Up to date, completed 12/13/2019, due 10/2020 Pneumococcal vaccine: Completed series Tdap vaccine: Up to date, completed 08/04/2012, due 08/2022 Shingles vaccine: Completed series   Covid-19:Completed series  Advanced directives: Please bring a copy of your POA (Power of [place]) and/or Living Will to your next appointment.   Conditions/risks identified: hyperlipidemia   Next appointment: Follow up in one year for your annual wellness visit    Preventive Care 65 Years and Older, Female Preventive care refers to lifestyle choices and visits with your health care provider that can promote health and wellness. What does preventive care include?  A yearly physical exam. This is also called an annual well check.  Dental exams once or twice a year.  Routine eye exams. Ask your health care provider how often you should have your eyes checked.  Personal lifestyle choices, including:  Daily care of your teeth and gums.  Regular physical activity.  Eating a healthy diet.  Avoiding tobacco and drug use.  Limiting alcohol use.  Practicing safe sex.  Taking low-dose aspirin every day.  Taking vitamin and mineral supplements as recommended by your health care provider. What happens during an annual well check? The services and screenings done by your health care  provider during your annual well check will depend on your age, overall health, lifestyle risk factors, and family history of disease. Counseling  Your health care provider may ask you questions about your:  Alcohol use.  Tobacco use.  Drug use.  Emotional well-being.  Home and relationship well-being.  Sexual activity.  Eating habits.  History of falls.  Memory and ability to understand (cognition).  Work and work Statistician.  Reproductive health. Screening  You may have the following tests or measurements:  Height, weight, and BMI.  Blood pressure.  Lipid and cholesterol levels. These may be checked every 5 years, or more frequently if you are over 56 years old.  Skin check.  Lung cancer screening. You may have this screening every year starting at age 50 if you have a 30-pack-year history of smoking and currently smoke or have quit within the past 15 years.  Fecal occult blood test (FOBT) of the stool. You may have this test every year starting at age 71.  Flexible sigmoidoscopy or colonoscopy. You may have a sigmoidoscopy every 5 years or a colonoscopy every 10 years starting at age 52.  Hepatitis C blood test.  Hepatitis B blood test.  Sexually transmitted disease (STD) testing.  Diabetes screening. This is done by checking your blood sugar (glucose) after you have not eaten for a while (fasting). You may have this done every 1-3 years.  Bone density scan. This is done to screen for osteoporosis. You may have this done starting at age 12.  Mammogram. This may be done every 1-2 years. Talk to your health care provider about how often you should have regular mammograms. Talk with your health care provider about your test results, treatment  options, and if necessary, the need for more tests. Vaccines  Your health care provider may recommend certain vaccines, such as:  Influenza vaccine. This is recommended every year.  Tetanus, diphtheria, and acellular  pertussis (Tdap, Td) vaccine. You may need a Td booster every 10 years.  Zoster vaccine. You may need this after age 78.  Pneumococcal 13-valent conjugate (PCV13) vaccine. One dose is recommended after age 42.  Pneumococcal polysaccharide (PPSV23) vaccine. One dose is recommended after age 62. Talk to your health care provider about which screenings and vaccines you need and how often you need them. This information is not intended to replace advice given to you by your health care provider. Make sure you discuss any questions you have with your health care provider. Document Released: 03/19/2015 Document Revised: 11/10/2015 Document Reviewed: 12/22/2014 Elsevier Interactive Patient Education  2017 [place] Prevention in the Home Falls can cause injuries. They can happen to people of all ages. There are many things you can do to make your home safe and to help prevent falls. What can I do on the outside of my home?  Regularly fix the edges of walkways and driveways and fix any cracks.  Remove anything that might make you trip as you walk through a door, such as a raised step or threshold.  Trim any bushes or trees on the path to your home.  Use bright outdoor lighting.  Clear any walking paths of anything that might make someone trip, such as rocks or tools.  Regularly check to see if handrails are loose or broken. Make sure that both sides of any steps have handrails.  Any raised decks and porches should have guardrails on the edges.  Have any leaves, snow, or ice cleared regularly.  Use sand or salt on walking paths during winter.  Clean up any spills in your garage right away. This includes oil or grease spills. What can I do in the bathroom?  Use night lights.  Install grab bars by the toilet and in the tub and shower. Do not use towel bars as grab bars.  Use non-skid mats or decals in the tub or shower.  If you need to sit down in the shower, use a plastic,  non-slip stool.  Keep the floor dry. Clean up any water that spills on the floor as soon as it happens.  Remove soap buildup in the tub or shower regularly.  Attach bath mats securely with double-sided non-slip rug tape.  Do not have throw rugs and other things on the floor that can make you trip. What can I do in the bedroom?  Use night lights.  Make sure that you have a light by your bed that is easy to reach.  Do not use any sheets or blankets that are too big for your bed. They should not hang down onto the floor.  Have a firm chair that has side arms. You can use this for support while you get dressed.  Do not have throw rugs and other things on the floor that can make you trip. What can I do in the kitchen?  Clean up any spills right away.  Avoid walking on wet floors.  Keep items that you use a lot in easy-to-reach places.  If you need to reach something above you, use a strong step stool that has a grab bar.  Keep electrical cords out of the way.  Do not use floor polish or wax that makes floors slippery.  If you must use wax, use non-skid floor wax.  Do not have throw rugs and other things on the floor that can make you trip. What can I do with my stairs?  Do not leave any items on the stairs.  Make sure that there are handrails on both sides of the stairs and use them. Fix handrails that are broken or loose. Make sure that handrails are as long as the stairways.  Check any carpeting to make sure that it is firmly attached to the stairs. Fix any carpet that is loose or worn.  Avoid having throw rugs at the top or bottom of the stairs. If you do have throw rugs, attach them to the floor with carpet tape.  Make sure that you have a light switch at the top of the stairs and the bottom of the stairs. If you do not have them, ask someone to add them for you. What else can I do to help prevent falls?  Wear shoes that:  Do not have high heels.  Have rubber  bottoms.  Are comfortable and fit you well.  Are closed at the toe. Do not wear sandals.  If you use a stepladder:  Make sure that it is fully opened. Do not climb a closed stepladder.  Make sure that both sides of the stepladder are locked into place.  Ask someone to hold it for you, if possible.  Clearly mark and make sure that you can see:  Any grab bars or handrails.  First and last steps.  Where the edge of each step is.  Use tools that help you move around (mobility aids) if they are needed. These include:  Canes.  Walkers.  Scooters.  Crutches.  Turn on the lights when you go into a dark area. Replace any light bulbs as soon as they burn out.  Set up your furniture so you have a clear path. Avoid moving your furniture around.  If any of your floors are uneven, fix them.  If there are any pets around you, be aware of where they are.  Review your medicines with your doctor. Some medicines can make you feel dizzy. This can increase your chance of falling. Ask your doctor what other things that you can do to help prevent falls. This information is not intended to replace advice given to you by your health care provider. Make sure you discuss any questions you have with your health care provider. Document Released: 12/17/2008 Document Revised: 07/29/2015 Document Reviewed: 03/27/2014 Elsevier Interactive Patient Education  2017 [employer].

## 2020-07-27 NOTE — Progress Notes (Signed)
No critical labs need to be addressed urgently. We will discuss labs in detail at upcoming office visit.   

## 2020-07-27 NOTE — Progress Notes (Signed)
PCP notes:  Health Maintenance: No gaps n   Abnormal Screenings: none   Patient concerns: Sleep issues Right leg edema    Nurse concerns: none   Next PCP appt.: 08/03/2020 @ 8:20 am

## 2020-07-27 NOTE — Progress Notes (Signed)
Subjective:   Sydney Ortiz is a 75 y.o. female who presents for Medicare Annual (Subsequent) preventive examination.  Review of Systems: N/A      I connected with the patient today by telephone and verified that I am speaking with the correct person using two identifiers. Location patient: home Location nurse: work Persons participating in the telephone visit: patient, nurse.   I discussed the limitations, risks, security and privacy concerns of performing an evaluation and management service by telephone and the availability of in person appointments. I also discussed with the patient that there may be a patient responsible charge related to this service. The patient expressed understanding and verbally consented to this telephonic visit.        Cardiac Risk Factors include: advanced age (>50men, >58 women);Other (see comment), Risk factor comments: hyperlipidemia     Objective:    Today's Vitals   There is no height or weight on file to calculate BMI.  Advanced Directives 07/27/2020 02/12/2020 02/10/2020 07/25/2019 07/23/2018 07/10/2017 04/08/2017  Does Patient Have a Medical Advance Directive? Yes Yes Yes Yes Yes Yes No  Type of Paramedic of [place];Living will [place];Living will [place];Living will [place];Living will [place];Living will [place];Living will -  Does patient want to make changes to medical advance directive? - No - Patient declined No - Patient declined - No - Patient declined - -  Copy of [place] in Chart? No - copy requested Yes - validated most recent copy scanned in chart (See row information) Yes - validated most recent copy scanned in chart (See row information) Yes - validated most recent copy scanned in chart (See row information) Yes - validated most recent copy scanned in chart (See row information) Yes -  Would  patient like information on creating a medical advance directive? - - - - - - No - Patient declined    Current Medications (verified) Outpatient Encounter Medications as of 07/27/2020  Medication Sig  . acetaminophen (TYLENOL) 650 MG CR tablet Take 1,300 mg by mouth every 8 (eight) hours.  Marland Kitchen atorvastatin (LIPITOR) 10 MG tablet TAKE ONE TABLET EVERY DAY  . budesonide (ENTOCORT EC) 3 MG 24 hr capsule Take 3 mg by mouth daily.  . diphenhydramine-acetaminophen (TYLENOL PM) 25-500 MG TABS Take 1 tablet by mouth at bedtime as needed (sleep).   . fexofenadine (ALLEGRA) 60 MG tablet Take 180 mg by mouth daily.  . fluticasone (FLONASE) 50 MCG/ACT nasal spray Place 2 sprays into both nostrils daily as needed for allergies.   . hyoscyamine (ANASPAZ) 0.125 MG TBDP Place 0.125 mg under the tongue every 6 (six) hours as needed for cramping.  . Multiple Vitamins-Minerals (MULTIVITAMIN ADULT PO) Take 1 tablet by mouth daily.   Marland Kitchen omeprazole (PRILOSEC) 20 MG capsule Take 20 mg by mouth 2 (two) times daily.  . Probiotic Product (PROBIOTIC-10 PO) Take 1 capsule by mouth daily.   Marland Kitchen HYDROcodone-acetaminophen (NORCO) 5-325 MG tablet Take 1 tablet by mouth every 6 (six) hours as needed for moderate pain.   No facility-administered encounter medications on file as of 07/27/2020.    Allergies (verified) Patient has no known allergies.   History: Past Medical History:  Diagnosis Date  . AR (allergic rhinitis)   . Asthma   . Barrett esophagus   . Breast cancer ([place])    hx; s/p masectomy 1994  . Carpal tunnel syndrome   .  Clostridium difficile infection 2015  . Enlarged liver   . Esophageal dysmotility    severe; has been elevated by [facility]ology at [facility] but no connective tissue disease diagnosis was made. was ANA positive but anti-SCL 7- negative . 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  . Family history of adverse reaction to anesthesia    grandmother had problems but unsure what problem was  .  GERD (gastroesophageal reflux disease)    24 hr pH study did not show significant acid refulux  . HLD (hyperlipidemia)    elevated triglycerides  . Impaired fasting glucose   . Migraine headache   . Osteoarthritis   . Paroxysmal SVT (supraventricular tachycardia) (HCC)    hx; 5 beat run noted on holter monitor in 2005. EF 65%  . PKD (polycystic kidney disease)   . PONV (postoperative nausea and vomiting)   . Pre-diabetes    Past Surgical History:  Procedure Laterality Date  . ABDOMINAL HYSTERECTOMY    . abt CT test  6/07   bilateral cysts, larget on R 9.6 x 6.7 cm  . APPENDECTOMY    . BREAST RECONSTRUCTION  9/11  . CATARACT EXTRACTION, BILATERAL Bilateral 2012, 2014  . COLONOSCOPY  2015, 2016  . colonoscopy/EGD  2005  . EYE SURGERY    . holter monitor  1/05   PVC, PACs, SVT  . kidney surgery - cyst removal  3/06  . KNEE ARTHROSCOPY WITH MEDIAL MENISECTOMY Right 02/12/2020   Procedure: Right knee partial medial menisectomy;  Surgeon: Hessie Knows, MD;  Location: [facility] ORS;  Service: Orthopedics;  Laterality: Right;  . MASTECTOMY Right   . PARTIAL HYSTERECTOMY  1983   due to fibroids  . radial masectomy  1993   R side  . right carpal tunnel  7/11  . rotator cuff, right  2004  . TONSILLECTOMY    . UPPER GI ENDOSCOPY  2010, 09/2016  . WRIST SURGERY Left 2017, 2018   fracture repair 2017; plates removed 5732   Family History  Problem Relation Age of Onset  . Arthritis Father   . Prostate cancer Father   . Hypertension Father   . Polycystic kidney disease Mother   . CVA Mother   . Dementia Mother   . Heart Problems Mother        valve disease  . Diabetes Other        DM - MU and MA   . Heart attack Brother   . Hypertension Brother   . Ulcers Brother    Social History   Socioeconomic History  . Marital status: Widowed    Spouse name: Not on file  . Number of children: Not on file  . Years of education: Not on file  . Highest education level: Not on file   Occupational History  . Not on file  Tobacco Use  . Smoking status: Former Smoker    Packs/day: 0.25    Years: 10.00    Pack years: 2.50    Quit date: 1986    Years since quitting: 36.4  . Smokeless tobacco: Never Used  Vaping Use  . Vaping Use: Never used  Substance and Sexual Activity  . Alcohol use: Yes    Comment: wine 3 times a week   . Drug use: No  . Sexual activity: Yes  Other Topics Concern  . Not on file  Social History Narrative   Married, 2 adopted children (1B, 1G)   Originally from [place]; moved from [place] to [place].    Regular  exercise - treadmill, 2 miles 3x/week; 4 miles daily    Retired - [employer]   Diet: avoids MSG (triggers HA), cultured cheese, coloring.       Pt signed designated party release form and gives Aylin Rhoads, spouse [phone] and, and son Josseline Reddin, [phone], access to medical records. Can leave msg on answering machine [phone] and on cell [phone].      Has living will, HCPOA husband, full code ( reviewed 2014)   Social Determinants of Health   Financial Resource Strain: Low Risk   . Difficulty of Paying Living Expenses: Not hard at all  Food Insecurity: No Food Insecurity  . Worried About Charity fundraiser in the Last Year: Never true  . Ran Out of Food in the Last Year: Never true  Transportation Needs: No Transportation Needs  . Lack of Transportation (Medical): No  . Lack of Transportation (Non-Medical): No  Physical Activity: Insufficiently Active  . Days of Exercise per Week: 2 days  . Minutes of Exercise per Session: 60 min  Stress: No Stress Concern Present  . Feeling of Stress : Not at all  Social Connections: Not on file    Tobacco Counseling Counseling given: Not Answered   Clinical Intake:  Pre-visit preparation completed: Yes  Pain : No/denies pain     Nutritional Risks: None Diabetes: No  How often do you need to have someone help you when you read instructions, pamphlets, or other written  materials from your doctor or pharmacy?: 1 - Never  Diabetic: No Nutrition Risk Assessment:  Has the patient had any N/V/D within the last 2 months?  No  Does the patient have any non-healing wounds?  No  Has the patient had any unintentional weight loss or weight gain?  No   Diabetes:  Is the patient diabetic?  No  If diabetic, was a CBG obtained today?  N/A Did the patient bring in their glucometer from home?  N/A How often do you monitor your CBG's? N/A.   Financial Strains and Diabetes Management:  Are you having any financial strains with the device, your supplies or your medication? N/A.  Does the patient want to be seen by Chronic Care Management for management of their diabetes?  N/A  Would the patient like to be referred to a Nutritionist or for Diabetic Management?  N/A   Interpreter Needed?: No  Information entered by :: CJohnson, LPN   Activities of Daily Living In your present state of health, do you have any difficulty performing the following activities: 07/27/2020 02/10/2020  Hearing? Tempie Donning  Comment wears hearing aids wears hearing aids  Vision? N N  Difficulty concentrating or making decisions? N N  Walking or climbing stairs? N Y  Comment - due to right knee pain  Dressing or bathing? N N  Doing errands, shopping? N Y  Comment - unable to drive due to knee injury  Preparing Food and eating ? N -  Using the Toilet? N -  In the past six months, have you accidently leaked urine? Y -  Comment does not wear pads -  Do you have problems with loss of bowel control? N -  Managing your Medications? N -  Managing your Finances? N -  Housekeeping or managing your Housekeeping? N -  Some recent data might be hidden    Patient Care Team: Jinny Sanders, MD as PCP - General Thelma Comp, [place] as Consulting Physician (Optometry) Newell Coral., MD as  Referring Physician (Gastroenterology) Donato Heinz, MD as Consulting Physician  (Nephrology)  Indicate any recent Medical Services you may have received from other than [facility] providers in the past year (date may be approximate).     Assessment:   This is a routine wellness examination for [place].  Hearing/Vision screen  Hearing Screening   125Hz  250Hz  500Hz  1000Hz  2000Hz  3000Hz  4000Hz  6000Hz  8000Hz   Right ear:           Left ear:           Vision Screening Comments: Patient gets annual eye exams   Dietary issues and exercise activities discussed: Current Exercise Habits: Structured exercise class, Type of exercise: Other - see comments (water aerobics), Time (Minutes): 60, Frequency (Times/Week): 2, Weekly Exercise (Minutes/Week): 120, Intensity: Moderate, Exercise limited by: None identified  Goals Addressed            This Visit's Progress   . Patient Stated       07/27/2020, I will continue to do water aerobics 2 days a week for 1 hour.       Depression Screen PHQ 2/9 Scores 07/27/2020 07/25/2019 07/23/2018 07/10/2017 05/23/2016 05/28/2015 04/23/2014  PHQ - 2 Score 0 0 0 0 0 0 0  PHQ- 9 Score 0 0 0 0 - - -    Fall Risk Fall Risk  07/27/2020 07/25/2019 07/23/2018 07/10/2017 05/23/2016  Falls in the past year? 0 0 0 No No  Number falls in past yr: 0 0 - - -  Injury with Fall? 0 0 - - -  Risk for fall due to : Medication side effect Medication side effect - - -  Follow up Falls evaluation completed;Falls prevention discussed Falls evaluation completed;Falls prevention discussed - - -    FALL RISK PREVENTION PERTAINING TO THE HOME:  Any stairs in or around the home? Yes  If so, are there any without handrails? No  Home free of loose throw rugs in walkways, pet beds, electrical cords, etc? Yes  Adequate lighting in your home to reduce risk of falls? Yes   ASSISTIVE DEVICES UTILIZED TO PREVENT FALLS:  Life alert? No  Use of a cane, walker or w/c? No  Grab bars in the bathroom? Yes  Shower chair or bench in shower? Yes  Elevated toilet seat or a handicapped  toilet? Yes   TIMED UP AND GO:  Was the test performed? N/A telephone visit .    Cognitive Function: MMSE - Mini Mental State Exam 07/27/2020 07/25/2019 07/23/2018 07/10/2017 05/23/2016  Not completed: - Refused - - -  Orientation to time 5 - 5 5 5   Orientation to Place 5 - 5 5 5   Registration 3 - 3 3 3   Attention/ Calculation 5 - 0 0 0  Recall 3 - 3 3 2   Recall-comments - - - - pt was unable to recall 1 of 3 words  Language- name 2 objects - - 0 0 0  Language- repeat 1 - 1 1 1   Language- follow 3 step command - - 0 3 3  Language- read & follow direction - - 0 0 0  Write a sentence - - 0 0 0  Copy design - - 0 0 0  Total score - - 17 20 19   Mini Cog  Mini-Cog screen was completed. Maximum score is 22. A value of 0 denotes this part of the MMSE was not completed or the patient failed this part of the Mini-Cog screening.  Immunizations Immunization History  Administered Date(s) Administered  . Fluad Quad(high Dose 65+) 11/12/2018, 12/13/2019  . Influenza Split 11/21/2010, 12/13/2011  . Influenza Whole 01/04/2007, 12/02/2007, 12/02/2008, 12/29/2009  . Influenza, High Dose Seasonal PF 12/14/2015, 12/04/2016  . Influenza,inj,Quad PF,6+ Mos 12/11/2012, 01/21/2014, 12/19/2017  . Influenza-Unspecified 01/01/2015  . Moderna SARS-COV2 Booster Vaccination 06/14/2020  . Moderna Sars-Covid-2 Vaccination 03/20/2019, 04/17/2019, 01/16/2020  . Pneumococcal Conjugate-13 02/09/2016  . Pneumococcal Polysaccharide-23 06/04/2005, 12/11/2012  . Td 01/05/2004  . Tdap 08/04/2012  . Zoster 07/16/2009  . Zoster Recombinat (Shingrix) 01/07/2018, 06/15/2019    TDAP status: Up to date  Flu Vaccine status: Up to date  Pneumococcal vaccine status: Up to date  Covid-19 vaccine status: Completed vaccines  Qualifies for Shingles Vaccine? Yes   Zostavax completed Yes   Shingrix Completed?: Yes  Screening Tests Health Maintenance  Topic Date Due  . MAMMOGRAM  08/27/2020  . INFLUENZA  VACCINE  10/04/2020  . TETANUS/TDAP  08/05/2022  . COLONOSCOPY (Pts 45-59yrs Insurance coverage will need to be confirmed)  09/10/2024  . DEXA SCAN  Completed  . COVID-19 Vaccine  Completed  . Hepatitis C Screening  Completed  . PNA vac Low Risk Adult  Completed  . HPV VACCINES  Aged Out    Health Maintenance  There are no preventive care reminders to display for this patient.  Colorectal cancer screening: Type of screening: Colonoscopy. Completed 09/11/2014. Repeat every 10 years  Mammogram status: Completed 08/28/2019. Repeat every year  Bone Density status: Completed 08/27/2017. Results reflect: Bone density results: NORMAL. Repeat every 2-5 years.  Lung Cancer Screening: (Low Dose CT Chest recommended if Age 4-80 years, 30 pack-year currently smoking OR have quit w/in 15years.) does not qualify.    Additional Screening:  Hepatitis C Screening: does qualify; Completed 08/16/2010  Vision Screening: Recommended annual ophthalmology exams for early detection of glaucoma and other disorders of the eye. Is the patient up to date with their annual eye exam?  Yes  Who is the provider or what is the name of the office in which the patient attends annual eye exams? [facility] If pt is not established with a provider, would they like to be referred to a provider to establish care? No .   Dental Screening: Recommended annual dental exams for proper oral hygiene  Community Resource Referral / Chronic Care Management: CRR required this visit?  No   CCM required this visit?  No      Plan:     I have personally reviewed and noted the following in the patient's chart:   . Medical and social history . Use of alcohol, tobacco or illicit drugs  . Current medications and supplements including opioid prescriptions.  . Functional ability and status . Nutritional status . Physical activity . Advanced directives . List of other physicians . Hospitalizations, surgeries, and ER  visits in previous 12 months . Vitals . Screenings to include cognitive, depression, and falls . Referrals and appointments  In addition, I have reviewed and discussed with patient certain preventive protocols, quality metrics, and best practice recommendations. A written personalized care plan for preventive services as well as general preventive health recommendations were provided to patient.   Due to this being a telephonic visit, the after visit summary with patients personalized plan was offered to patient via office or my-chart. Patient preferred to pick up at office at next visit or via mychart.   Andrez Grime, LPN   [id number]/[id number]

## 2020-08-03 ENCOUNTER — Encounter: Payer: Self-pay | Admitting: Family Medicine

## 2020-08-03 ENCOUNTER — Ambulatory Visit (INDEPENDENT_AMBULATORY_CARE_PROVIDER_SITE_OTHER): Payer: Medicare Other | Admitting: Family Medicine

## 2020-08-03 ENCOUNTER — Other Ambulatory Visit: Payer: Self-pay

## 2020-08-03 VITALS — BP 118/72 | HR 69 | Temp 98.2°F | Ht 63.0 in | Wt 149.0 lb

## 2020-08-03 DIAGNOSIS — J4520 Mild intermittent asthma, uncomplicated: Secondary | ICD-10-CM | POA: Diagnosis not present

## 2020-08-03 DIAGNOSIS — F5104 Psychophysiologic insomnia: Secondary | ICD-10-CM

## 2020-08-03 DIAGNOSIS — E782 Mixed hyperlipidemia: Secondary | ICD-10-CM

## 2020-08-03 DIAGNOSIS — Q613 Polycystic kidney, unspecified: Secondary | ICD-10-CM | POA: Diagnosis not present

## 2020-08-03 DIAGNOSIS — Z0000 Encounter for general adult medical examination without abnormal findings: Secondary | ICD-10-CM | POA: Diagnosis not present

## 2020-08-03 DIAGNOSIS — G4733 Obstructive sleep apnea (adult) (pediatric): Secondary | ICD-10-CM

## 2020-08-03 NOTE — Assessment & Plan Note (Signed)
LDL remains at goal < 100 and improved from last year. On atorvastatin 10mg  daily.

## 2020-08-03 NOTE — Assessment & Plan Note (Signed)
Currently using tylenol PM nightly.. she will try minimizing use.  If not controlled on lower dose.. consider trazadone.

## 2020-08-03 NOTE — Assessment & Plan Note (Signed)
Followed by Dr. Zara Chess. Nephrology.

## 2020-08-03 NOTE — Assessment & Plan Note (Signed)
Minimal issue with allergy control.

## 2020-08-03 NOTE — Progress Notes (Signed)
Patient ID: Sydney Ortiz, female    DOB: 03/08/1945, 75 y.o.   MRN: [id number]  This visit was conducted in person.  BP 118/72   Pulse 69   Temp 98.2 F (36.8 C) (Temporal)   Ht 5\' 3"  (1.6 m)   Wt 149 lb (67.6 kg)   SpO2 94%   BMI 26.39 kg/m    CC:  Chief Complaint  Patient presents with  . Annual Exam    Subjective:   HPI: Sydney Ortiz is a 75 y.o. female presenting on 08/03/2020 for Annual Exam  The patient presents for  complete physical and review of chronic health problems. He/She also has the following acute concerns today:  The patient saw a LPN or RN for medicare wellness visit.  Prevention and wellness was reviewed in detail. Note reviewed and important notes copied below.  Health Maintenance: No gaps noted   Abnormal Screenings: none  08/03/20  Reviewed labs in detail with patient.   Had right knee arthroscopy in 02/2020 for  Torn meniscus... also has OA.   Hx of microscopic colitis: On budesonide for flare since April.. tapering down now.  Elevated Cholesterol:LDL remains at goal < 100 and improved from last year. On atorvastatin 10mg  daily. Lab Results  Component Value Date   CHOL 185 07/27/2020   HDL 66.60 07/27/2020   LDLCALC 84 07/27/2020   LDLDIRECT 130.8 11/15/2009   TRIG 175.0 (H) 07/27/2020   CHOLHDL 3 07/27/2020  Using medications without problems: none Muscle aches: none Diet compliance: heart healthy Exercise: walking and water aerobic. Other complaints:  Wt Readings from Last 3 Encounters:  08/03/20 149 lb (67.6 kg)  02/10/20 149 lb (67.6 kg)  01/31/20 147 lb (66.7 kg)     Dx of OSA;  Now has oral  applaine and now has improvement in symptoms.  Polycystic kidney disease followed by renal, Dr. Zara Chess... stable   Mild intermittent asthma Stable control   Allegra, flonasecontrols allergies.  Relevant past medical, surgical, family and social history reviewed and updated as indicated. Interim medical history since  our last visit reviewed. Allergies and medications reviewed and updated. Outpatient Medications Prior to Visit  Medication Sig Dispense Refill  . acetaminophen (TYLENOL) 650 MG CR tablet Take 1,300 mg by mouth every 8 (eight) hours.    Marland Kitchen atorvastatin (LIPITOR) 10 MG tablet TAKE ONE TABLET EVERY DAY 30 tablet 1  . budesonide (ENTOCORT EC) 3 MG 24 hr capsule Take 3 mg by mouth daily. Patient taking 2 capsule every day for 1 month.    . diphenhydramine-acetaminophen (TYLENOL PM) 25-500 MG TABS Take 1 tablet by mouth at bedtime as needed (sleep).     . fexofenadine (ALLEGRA) 60 MG tablet Take 180 mg by mouth daily.    . fluticasone (FLONASE) 50 MCG/ACT nasal spray Place 2 sprays into both nostrils daily as needed for allergies.     . hyoscyamine (ANASPAZ) 0.125 MG TBDP Place 0.125 mg under the tongue every 6 (six) hours as needed for cramping.    . Multiple Vitamins-Minerals (MULTIVITAMIN ADULT PO) Take 1 tablet by mouth daily.     Marland Kitchen omeprazole (PRILOSEC) 20 MG capsule Take 20 mg by mouth 2 (two) times daily.    . Probiotic Product (PROBIOTIC-10 PO) Take 1 capsule by mouth daily.     Marland Kitchen HYDROcodone-acetaminophen (NORCO) 5-325 MG tablet Take 1 tablet by mouth every 6 (six) hours as needed for moderate pain. 30 tablet 0   No facility-administered medications prior to visit.  Per HPI unless specifically indicated in ROS section below Review of Systems  Constitutional: Negative for fatigue and fever.  HENT: Negative for congestion.   Eyes: Negative for pain.  Respiratory: Negative for cough and shortness of breath.   Cardiovascular: Negative for chest pain, palpitations and leg swelling.  Gastrointestinal: Negative for abdominal pain.       Has EGD planned this year.  Genitourinary: Negative for dysuria and vaginal bleeding.  Musculoskeletal: Negative for back pain.  Neurological: Negative for syncope, light-headedness and headaches.  Psychiatric/Behavioral: Negative for dysphoric mood.    Objective:  BP 118/72   Pulse 69   Temp 98.2 F (36.8 C) (Temporal)   Ht 5\' 3"  (1.6 m)   Wt 149 lb (67.6 kg)   SpO2 94%   BMI 26.39 kg/m   Wt Readings from Last 3 Encounters:  08/03/20 149 lb (67.6 kg)  02/10/20 149 lb (67.6 kg)  01/31/20 147 lb (66.7 kg)      Physical Exam Constitutional:      General: She is not in acute distress.    Appearance: Normal appearance. She is well-developed. She is not ill-appearing or toxic-appearing.  HENT:     Head: Normocephalic.     Right Ear: Hearing, tympanic membrane, ear canal and external ear normal. Tympanic membrane is not erythematous, retracted or bulging.     Left Ear: Hearing, tympanic membrane, ear canal and external ear normal. Tympanic membrane is not erythematous, retracted or bulging.     Nose: No mucosal edema or rhinorrhea.     Right Sinus: No maxillary sinus tenderness or frontal sinus tenderness.     Left Sinus: No maxillary sinus tenderness or frontal sinus tenderness.     Mouth/Throat:     Pharynx: Uvula midline.  Eyes:     General: Lids are normal. Lids are everted, no foreign bodies appreciated.     Conjunctiva/sclera: Conjunctivae normal.     Pupils: Pupils are equal, round, and reactive to light.  Neck:     Thyroid: No thyroid mass or thyromegaly.     Vascular: No carotid bruit.     Trachea: Trachea normal.  Cardiovascular:     Rate and Rhythm: Normal rate and regular rhythm.     Pulses: Normal pulses.     Heart sounds: Normal heart sounds, S1 normal and S2 normal. No murmur heard. No friction rub. No gallop.   Pulmonary:     Effort: Pulmonary effort is normal. No tachypnea or respiratory distress.     Breath sounds: Normal breath sounds. No decreased breath sounds, wheezing, rhonchi or rales.  Abdominal:     General: Bowel sounds are normal.     Palpations: Abdomen is soft.     Tenderness: There is no abdominal tenderness.  Musculoskeletal:     Cervical back: Normal range of motion and neck supple.   Skin:    General: Skin is warm and dry.     Findings: No rash.  Neurological:     Mental Status: She is alert.  Psychiatric:        Mood and Affect: Mood is not anxious or depressed.        Speech: Speech normal.        Behavior: Behavior normal. Behavior is cooperative.        Thought Content: Thought content normal.        Judgment: Judgment normal.       Results for orders placed or performed in visit on 07/27/20  Comprehensive metabolic panel  Result Value Ref Range   Sodium 141 135 - 145 mEq/L   Potassium 3.8 3.5 - 5.1 mEq/L   Chloride 104 96 - 112 mEq/L   CO2 29 19 - 32 mEq/L   Glucose, Bld 91 70 - 99 mg/dL   BUN 12 6 - 23 mg/dL   Creatinine, Ser 0.68 0.40 - 1.20 mg/dL   Total Bilirubin 0.5 0.2 - 1.2 mg/dL   Alkaline Phosphatase 59 39 - 117 U/L   AST 17 0 - 37 U/L   ALT 18 0 - 35 U/L   Total Protein 6.6 6.0 - 8.3 g/dL   Albumin 4.2 3.5 - 5.2 g/dL   GFR 85.33 >60.00 mL/min   Calcium 9.6 8.4 - 10.5 mg/dL  Lipid panel  Result Value Ref Range   Cholesterol 185 0 - 200 mg/dL   Triglycerides 175.0 (H) 0.0 - 149.0 mg/dL   HDL 66.60 >39.00 mg/dL   VLDL 35.0 0.0 - 40.0 mg/dL   LDL Cholesterol 84 0 - 99 mg/dL   Total CHOL/HDL Ratio 3    NonHDL 118.52     This visit occurred during the SARS-CoV-2 public health emergency.  Safety protocols were in place, including screening questions prior to the visit, additional usage of staff PPE, and extensive cleaning of exam room while observing appropriate contact time as indicated for disinfecting solutions.   COVID 19 screen:  No recent travel or known exposure to COVID19 The patient denies respiratory symptoms of COVID 19 at this time. The importance of social distancing was discussed today.   Assessment and Plan The patient's preventative maintenance and recommended screening tests for an annual wellness exam were reviewed in full today. Brought up to date unless services declined.  Counselled on the importance of diet,  exercise, and its role in overall health and mortality. The patient's FH and SH was reviewed, including their home life, tobacco status, and drug and alcohol status.   Mammo/breast exam: personal history of breast cancer: stable6/2021, scheduled 6/22 Vaccines: Uptodate with PNA, flu, shingles, TDap, prevnar, COVID19 DVE/pap:partial hysterectomy, one ovary remains. DVEnot indicated, no pap indicated.  Colon: 09/2014 bx microscopic colitis, repeat in 10 years Dr. Clydene Laming at [facility]. DXA:08/27/2017 normal.. repeat in 5 years Former smoker minimal use. Hep C negative    Problem List Items Addressed This Visit    Chronic insomnia     Currently using tylenol PM nightly.. she will try minimizing use.  If not controlled on lower dose.. consider trazadone.        Hyperlipidemia    LDL remains at goal < 100 and improved from last year. On atorvastatin 10mg  daily.       Mild intermittent asthma    Minimal issue with allergy control.      OSA (obstructive sleep apnea)    Improved symptoms with oral appliance      Polycystic kidney disease     Followed by Dr. Zara Chess. Nephrology.       Other Visit Diagnoses    Routine general medical examination at a health care facility    -  Primary       Eliezer Lofts, MD

## 2020-08-03 NOTE — Patient Instructions (Addendum)
Elevated feet and wear compression hose on long trips and when standing.  Keep up heathy eating and regular exercise.  Call if you would like to try a trial of trazodone for sleep.

## 2020-08-03 NOTE — Assessment & Plan Note (Signed)
Improved symptoms with oral appliance

## 2020-08-30 ENCOUNTER — Ambulatory Visit: Payer: Medicare Other

## 2020-08-31 ENCOUNTER — Other Ambulatory Visit: Payer: Self-pay | Admitting: Family Medicine

## 2020-10-13 ENCOUNTER — Other Ambulatory Visit: Payer: Self-pay

## 2020-10-13 ENCOUNTER — Ambulatory Visit
Admission: RE | Admit: 2020-10-13 | Discharge: 2020-10-13 | Disposition: A | Discharge status: Home / Self Care | Payer: Medicare Other | Source: Ambulatory Visit | Attending: Family Medicine | Admitting: Family Medicine

## 2020-10-13 ENCOUNTER — Encounter: Payer: Self-pay | Admitting: Family Medicine

## 2020-10-13 ENCOUNTER — Ambulatory Visit: Payer: Medicare Other | Admitting: Family Medicine

## 2020-10-13 VITALS — BP 110/60 | HR 74 | Temp 98.3°F | Ht 63.0 in | Wt 146.0 lb

## 2020-10-13 DIAGNOSIS — M791 Myalgia, unspecified site: Secondary | ICD-10-CM

## 2020-10-13 DIAGNOSIS — E559 Vitamin D deficiency, unspecified: Secondary | ICD-10-CM | POA: Diagnosis not present

## 2020-10-13 DIAGNOSIS — M2550 Pain in unspecified joint: Secondary | ICD-10-CM

## 2020-10-13 DIAGNOSIS — Z1231 Encounter for screening mammogram for malignant neoplasm of breast: Secondary | ICD-10-CM

## 2020-10-13 NOTE — Patient Instructions (Addendum)
Stop the atorvastatin.  If makes no difference in a week, then ok to go back on it.

## 2020-10-13 NOTE — Progress Notes (Signed)
Kalana Yust T. Curlee Bogan, MD, [place] at [facility] [place], [address]  Phone: [phone]  FAX: [phone]  Sydney Ortiz - 75 y.o. female  MRN [id number]  Date of Birth: December 11, 1945  Date: 10/13/2020  PCP: Jinny Sanders, MD  Referral: Jinny Sanders, MD  Chief Complaint  Patient presents with   Muscle Pain    X 1 month    This visit occurred during the SARS-CoV-2 public health emergency.  Safety protocols were in place, including screening questions prior to the visit, additional usage of staff PPE, and extensive cleaning of exam room while observing appropriate contact time as indicated for disinfecting solutions.   Subjective:   Sydney Ortiz is a 75 y.o. very pleasant female patient with Body mass index is 25.86 kg/m. who presents with the following:  This is a very pleasant young lady that I recall well from a prior arthritis visit, but she presents today with new symptoms which are distinctly different compared to her normal state of health.  She has been having some muscle and joint aches that are new in the last month.  She is having achiness in general, and she is waking up at night with pain all over.  She does endorse arthralgia throughout much of her body, and multiple joints, worse on the left side.  She does not endorse any injury whatsoever. She has no known history of rheumatological disease.  Went to visit her children in [place].    When had some shingles.  Felt tired and muscles were hurting a lot.  This is been sometime, however.  Wakes up at night.   Started all over. Knee and also on the left side.   Takes a lot to get up in the morning Ticks? Some nights  When rotating on one side, will hurt on both sides.   Has colitis.   Will get steroids when she needs it.   Around the same time as shingles.   Was on Budesonide - with flair  Has been on lipitor  Taking it in the  morning    Review of Systems is noted in the HPI, as appropriate   Objective:   BP 110/60   Pulse 74   Temp 98.3 F (36.8 C) (Temporal)   Ht '5\' 3"'$  (1.6 m)   Wt 146 lb (66.2 kg)   SpO2 95%   BMI 25.86 kg/m   GEN: No acute distress; alert,appropriate. PULM: Breathing comfortably in no respiratory distress PSYCH: Normally interactive.   She seems to walk normally without a specific limp.  She does have full range of motion throughout upper extremities and lower extremities. She does have some bogginess at the wrist and digits consistent with synovitis. Full range of motion at the spine. I do not appreciate any deficit in muscular strength. She is otherwise neurovascularly intact  Radiology: Results for orders placed or performed in visit on 10/13/20  B. burgdorfi antibodies by WB  Result Value Ref Range   B burgdorferi IgG Abs (IB) NEGATIVE NEGATIVE   Lyme Disease 18 kD IgG NON-REACTIVE    Lyme Disease 23 kD IgG NON-REACTIVE    Lyme Disease 28 kD IgG NON-REACTIVE    Lyme Disease 30 kD IgG NON-REACTIVE    Lyme Disease 39 kD IgG NON-REACTIVE    Lyme Disease 41 kD IgG REACTIVE (A)    Lyme Disease 45 kD IgG NON-REACTIVE    Lyme Disease 58 kD IgG NON-REACTIVE  Lyme Disease 66 kD IgG NON-REACTIVE    Lyme Disease 93 kD IgG NON-REACTIVE    B burgdorferi IgM Abs (IB) POSITIVE (A) NEGATIVE   Lyme Disease 23 kD IgM REACTIVE (A)    Lyme Disease 39 kD IgM REACTIVE (A)    Lyme Disease 41 kD IgM NON-REACTIVE   Rocky mtn spotted fvr abs pnl(IgG+IgM)  Result Value Ref Range   RMSF IgG NOT DETECTED NOT DETECTED   RMSF IgM NOT DETECTED NOT DETECTED  VITAMIN D 25 Hydroxy (Vit-D Deficiency, Fractures)  Result Value Ref Range   VITD 39.23 30.00 - 100.00 ng/mL  CBC with Differential/Platelet  Result Value Ref Range   WBC 7.0 4.0 - 10.5 K/uL   RBC 4.20 3.87 - 5.11 Mil/uL   Hemoglobin 12.6 12.0 - 15.0 g/dL   HCT 38.6 36.0 - 46.0 %   MCV 91.9 78.0 - 100.0 fl   MCHC 32.7 30.0 - 36.0  g/dL   RDW 13.9 11.5 - 15.5 %   Platelets 343.0 150.0 - 400.0 K/uL   Neutrophils Relative % 61.5 43.0 - 77.0 %   Lymphocytes Relative 26.5 12.0 - 46.0 %   Monocytes Relative 10.5 3.0 - 12.0 %   Eosinophils Relative 0.7 0.0 - 5.0 %   Basophils Relative 0.8 0.0 - 3.0 %   Neutro Abs 4.3 1.4 - 7.7 K/uL   Lymphs Abs 1.8 0.7 - 4.0 K/uL   Monocytes Absolute 0.7 0.1 - 1.0 K/uL   Eosinophils Absolute 0.0 0.0 - 0.7 K/uL   Basophils Absolute 0.1 0.0 - 0.1 K/uL  TSH  Result Value Ref Range   TSH 1.56 0.35 - 5.50 uIU/mL  CK  Result Value Ref Range   Total CK 116 7 - 177 U/L  High sensitivity CRP  Result Value Ref Range   CRP, High Sensitivity 36.870 (H) 0.000 - 5.000 mg/L  Sedimentation rate  Result Value Ref Range   Sed Rate 17 0 - 30 mm/hr     Assessment and Plan:     ICD-10-CM   1. Myalgia  M79.10 B. burgdorfi antibodies by WB    Rocky mtn spotted fvr abs pnl(IgG+IgM)    Ehrlichia antibody panel    VITAMIN D 25 Hydroxy (Vit-D Deficiency, Fractures)    CBC with Differential/Platelet    TSH    CK    High sensitivity CRP    Sedimentation rate    2. Vitamin D deficiency  E55.9 VITAMIN D 25 Hydroxy (Vit-D Deficiency, Fractures)    3. Polyarthralgia  M25.50 B. burgdorfi antibodies by WB    Rocky mtn spotted fvr abs pnl(IgG+IgM)    Ehrlichia antibody panel    CBC with Differential/Platelet    TSH    CK    High sensitivity CRP    Sedimentation rate     At the time of this dictation the patient's CRP has returned at 37.  Lyme disease titers indicate a positive IgM with 2 out of 3 IgM patterns on Western blot.  IgG is negative.  Reviewed both up-to-date and CDC, and this criteria along with clinical symptoms would go along with acute Lyme disease.  Given endorsement of polyarthralgia, I am going to treat her with 28 days of doxycycline.  100 mg p.o twice daily x28 days, #56.  This was sent in on her MyChart encounter.  I attempted to call the patient, but I sent a detailed MyChart  message when there was no answer.  I did leave a message on machine.  Medications Discontinued During This Encounter  Medication Reason   budesonide (ENTOCORT EC) 3 MG 24 hr capsule Completed Course   Orders Placed This Encounter  Procedures   B. burgdorfi antibodies by WB   Rocky mtn spotted fvr abs pnl(IgG+IgM)   Ehrlichia antibody panel   VITAMIN D 25 Hydroxy (Vit-D Deficiency, Fractures)   CBC with Differential/Platelet   TSH   CK   High sensitivity CRP   Sedimentation rate    Dr[employer] One speech-to-text software was used for transcription in this dictation.  Possible transcriptional errors can occur using Editor, commissioning.   Signed,  Maud Deed. Feliciana Narayan, MD   Outpatient Encounter Medications as of 10/13/2020  Medication Sig   acetaminophen (TYLENOL) 650 MG CR tablet Take 1,300 mg by mouth every 8 (eight) hours.   atorvastatin (LIPITOR) 10 MG tablet TAKE 1 TABLET BY MOUTH DAILY   diphenhydramine-acetaminophen (TYLENOL PM) 25-500 MG TABS Take 1 tablet by mouth at bedtime as needed (sleep).    fexofenadine (ALLEGRA) 60 MG tablet Take 180 mg by mouth daily.   fluticasone (FLONASE) 50 MCG/ACT nasal spray Place 2 sprays into both nostrils daily as needed for allergies.    hyoscyamine (ANASPAZ) 0.125 MG TBDP Place 0.125 mg under the tongue every 6 (six) hours as needed for cramping.   Multiple Vitamins-Minerals (MULTIVITAMIN ADULT PO) Take 1 tablet by mouth daily.    omeprazole (PRILOSEC) 20 MG capsule Take 20 mg by mouth 2 (two) times daily.   Probiotic Product (PROBIOTIC-10 PO) Take 1 capsule by mouth daily.    [DISCONTINUED] budesonide (ENTOCORT EC) 3 MG 24 hr capsule Take 3 mg by mouth daily. Patient taking 2 capsule every day for 1 month.   No facility-administered encounter medications on file as of 10/13/2020.

## 2020-10-14 DIAGNOSIS — A6920 Lyme disease, unspecified: Secondary | ICD-10-CM

## 2020-10-14 LAB — CBC WITH DIFFERENTIAL/PLATELET
Basophils Absolute: 0.1 10*3/uL (ref 0.0–0.1)
Basophils Relative: 0.8 % (ref 0.0–3.0)
Eosinophils Absolute: 0 10*3/uL (ref 0.0–0.7)
Eosinophils Relative: 0.7 % (ref 0.0–5.0)
HCT: 38.6 % (ref 36.0–46.0)
Hemoglobin: 12.6 g/dL (ref 12.0–15.0)
Lymphocytes Relative: 26.5 % (ref 12.0–46.0)
Lymphs Abs: 1.8 10*3/uL (ref 0.7–4.0)
MCHC: 32.7 g/dL (ref 30.0–36.0)
MCV: 91.9 fl (ref 78.0–100.0)
Monocytes Absolute: 0.7 10*3/uL (ref 0.1–1.0)
Monocytes Relative: 10.5 % (ref 3.0–12.0)
Neutro Abs: 4.3 10*3/uL (ref 1.4–7.7)
Neutrophils Relative %: 61.5 % (ref 43.0–77.0)
Platelets: 343 10*3/uL (ref 150.0–400.0)
RBC: 4.2 Mil/uL (ref 3.87–5.11)
RDW: 13.9 % (ref 11.5–15.5)
WBC: 7 10*3/uL (ref 4.0–10.5)

## 2020-10-14 LAB — SEDIMENTATION RATE: Sed Rate: 17 mm/hr (ref 0–30)

## 2020-10-14 LAB — CK: Total CK: 116 U/L (ref 7–177)

## 2020-10-14 LAB — TSH: TSH: 1.56 u[IU]/mL (ref 0.35–5.50)

## 2020-10-14 LAB — HIGH SENSITIVITY CRP: CRP, High Sensitivity: 36.87 mg/L — ABNORMAL HIGH (ref 0.000–5.000)

## 2020-10-14 LAB — VITAMIN D 25 HYDROXY (VIT D DEFICIENCY, FRACTURES): VITD: 39.23 ng/mL (ref 30.00–100.00)

## 2020-10-15 MED ORDER — DOXYCYCLINE HYCLATE 100 MG PO TABS: 100.0000 mg | ORAL_TABLET | Freq: Two times a day (BID) | ORAL | 0 refills | Status: DC

## 2020-10-15 NOTE — Telephone Encounter (Signed)
Amy:  FYI - + 2/3 IgM Lyme AB on Western blot with neg IgG and polyarthralgia x 3-4 weeks    ICD-10-CM   1. Lyme disease  A69.20 doxycycline (VIBRA-TABS) 100 MG tablet      Meds ordered this encounter  Medications   doxycycline (VIBRA-TABS) 100 MG tablet    Sig: Take 1 tablet (100 mg total) by mouth 2 (two) times daily.    Dispense:  56 tablet    Refill:  0   Results for orders placed or performed in visit on 10/13/20  B. burgdorfi antibodies by WB  Result Value Ref Range   B burgdorferi IgG Abs (IB) NEGATIVE NEGATIVE   Lyme Disease 18 kD IgG NON-REACTIVE    Lyme Disease 23 kD IgG NON-REACTIVE    Lyme Disease 28 kD IgG NON-REACTIVE    Lyme Disease 30 kD IgG NON-REACTIVE    Lyme Disease 39 kD IgG NON-REACTIVE    Lyme Disease 41 kD IgG REACTIVE (A)    Lyme Disease 45 kD IgG NON-REACTIVE    Lyme Disease 58 kD IgG NON-REACTIVE    Lyme Disease 66 kD IgG NON-REACTIVE    Lyme Disease 93 kD IgG NON-REACTIVE    B burgdorferi IgM Abs (IB) POSITIVE (A) NEGATIVE   Lyme Disease 23 kD IgM REACTIVE (A)    Lyme Disease 39 kD IgM REACTIVE (A)    Lyme Disease 41 kD IgM NON-REACTIVE   Rocky mtn spotted fvr abs pnl(IgG+IgM)  Result Value Ref Range   RMSF IgG NOT DETECTED NOT DETECTED   RMSF IgM NOT DETECTED NOT DETECTED  VITAMIN D 25 Hydroxy (Vit-D Deficiency, Fractures)  Result Value Ref Range   VITD 39.23 30.00 - 100.00 ng/mL  CBC with Differential/Platelet  Result Value Ref Range   WBC 7.0 4.0 - 10.5 K/uL   RBC 4.20 3.87 - 5.11 Mil/uL   Hemoglobin 12.6 12.0 - 15.0 g/dL   HCT 38.6 36.0 - 46.0 %   MCV 91.9 78.0 - 100.0 fl   MCHC 32.7 30.0 - 36.0 g/dL   RDW 13.9 11.5 - 15.5 %   Platelets 343.0 150.0 - 400.0 K/uL   Neutrophils Relative % 61.5 43.0 - 77.0 %   Lymphocytes Relative 26.5 12.0 - 46.0 %   Monocytes Relative 10.5 3.0 - 12.0 %   Eosinophils Relative 0.7 0.0 - 5.0 %   Basophils Relative 0.8 0.0 - 3.0 %   Neutro Abs 4.3 1.4 - 7.7 K/uL   Lymphs Abs 1.8 0.7 - 4.0 K/uL    Monocytes Absolute 0.7 0.1 - 1.0 K/uL   Eosinophils Absolute 0.0 0.0 - 0.7 K/uL   Basophils Absolute 0.1 0.0 - 0.1 K/uL  TSH  Result Value Ref Range   TSH 1.56 0.35 - 5.50 uIU/mL  CK  Result Value Ref Range   Total CK 116 7 - 177 U/L  High sensitivity CRP  Result Value Ref Range   CRP, High Sensitivity 36.870 (H) 0.000 - 5.000 mg/L  Sedimentation rate  Result Value Ref Range   Sed Rate 17 0 - 30 mm/hr

## 2020-10-18 ENCOUNTER — Telehealth: Payer: Self-pay

## 2020-10-18 NOTE — Telephone Encounter (Signed)
Sydney Ortiz - Client Nonclinical Telephone Record AccessNurse Client [name] - Client Client Site [facility] - Ortiz Physician Eliezer Lofts - MD Contact Type Call Who Is Calling Patient / Member / Family / Caregiver Caller Name [name] Caller Phone Number [phone] Call Type Message Only Information Provided Reason for Call Returning a Call from the Office Initial [facility] [place] states is returning a call. Might be about lab work results. Additional Comment Thank You Disp. Time Disposition Final User 10/15/2020 5:40:54 PM General Information Provided Yes [name] Call Closed By: [name] Transaction Date/Time: 10/15/2020 5:38:42 PM (ET)

## 2020-10-18 NOTE — Telephone Encounter (Signed)
Left message for Sydney Ortiz that I do not know who called her on Friday.  Dr. Lorelei Pont sent her a detailed MyChart message about her lab results but I don't see where anyone called her.  I ask that if she has any questions or concerns to please call me back.

## 2020-10-18 NOTE — Telephone Encounter (Signed)
I sent her some long mychart messages explaining in a lot of detail.  If she needs me to call her, it will not be able to do it until tonight.

## 2020-10-18 NOTE — Telephone Encounter (Signed)
Dr. Lorelei Pont did you call this patient??

## 2020-10-19 ENCOUNTER — Telehealth: Payer: Self-pay | Admitting: *Deleted

## 2020-10-19 MED ORDER — TRAZODONE HCL 50 MG PO TABS: 25.0000 mg | ORAL_TABLET | Freq: Every evening | ORAL | 3 refills | Status: DC | PRN

## 2020-10-19 NOTE — Telephone Encounter (Signed)
Patient left a voicemail stating that she has talked with Dr. Diona Browner previously about her not being able to sleep. Patient wants to know if Dr. Diona Browner will prescribe medication to help her sleep. [facility].

## 2020-10-20 LAB — B. BURGDORFI ANTIBODIES BY WB
B burgdorferi IgG Abs (IB): NEGATIVE
B burgdorferi IgM Abs (IB): POSITIVE — AB
Lyme Disease 18 kD IgG: NONREACTIVE
Lyme Disease 23 kD IgG: NONREACTIVE
Lyme Disease 23 kD IgM: REACTIVE — AB
Lyme Disease 28 kD IgG: NONREACTIVE
Lyme Disease 30 kD IgG: NONREACTIVE
Lyme Disease 39 kD IgG: NONREACTIVE
Lyme Disease 39 kD IgM: REACTIVE — AB
Lyme Disease 41 kD IgG: REACTIVE — AB
Lyme Disease 41 kD IgM: NONREACTIVE
Lyme Disease 45 kD IgG: NONREACTIVE
Lyme Disease 58 kD IgG: NONREACTIVE
Lyme Disease 66 kD IgG: NONREACTIVE
Lyme Disease 93 kD IgG: NONREACTIVE

## 2020-10-20 LAB — ROCKY MTN SPOTTED FVR ABS PNL(IGG+IGM)
RMSF IgG: NOT DETECTED
RMSF IgM: NOT DETECTED

## 2020-10-20 LAB — EHRLICHIA ANTIBODY PANEL
E. CHAFFEENSIS AB IGG: <1:64 {titer}
E. CHAFFEENSIS AB IGM: <1:20 {titer}

## 2020-10-22 DIAGNOSIS — A6920 Lyme disease, unspecified: Secondary | ICD-10-CM | POA: Insufficient documentation

## 2020-12-03 ENCOUNTER — Ambulatory Visit: Payer: Medicare Other | Admitting: Family Medicine

## 2020-12-07 ENCOUNTER — Other Ambulatory Visit: Payer: Self-pay

## 2020-12-07 ENCOUNTER — Ambulatory Visit: Payer: Medicare Other | Admitting: Family Medicine

## 2020-12-07 VITALS — BP 128/76 | HR 71 | Temp 98.0°F | Resp 12 | Ht 63.0 in | Wt 138.6 lb

## 2020-12-07 DIAGNOSIS — M791 Myalgia, unspecified site: Secondary | ICD-10-CM | POA: Diagnosis not present

## 2020-12-07 DIAGNOSIS — B948 Sequelae of other specified infectious and parasitic diseases: Secondary | ICD-10-CM | POA: Diagnosis not present

## 2020-12-07 MED ORDER — MELOXICAM 15 MG PO TABS: 15.0000 mg | ORAL_TABLET | Freq: Every day | ORAL | 0 refills | Status: DC

## 2020-12-07 NOTE — Progress Notes (Signed)
Patient ID: Sydney Ortiz, female    DOB: 10-Sep-1945, 75 y.o.   MRN: [id number]  This visit was conducted in person.  BP 128/76   Pulse 71   Temp 98 F (36.7 C)   Resp 12   Ht 5\' 3"  (1.6 m)   Wt 138 lb 9 oz (62.9 kg)   SpO2 94%   BMI 24.55 kg/m    CC: Chief Complaint  Patient presents with   Follow-up    On lyme's disease. Severe muscle pain, left shoulder pain still.    Subjective:   HPI: Sydney Ortiz is a 75 y.o. female presenting on 12/07/2020 for Follow-up (On lyme's disease. Severe muscle pain, left shoulder pain still.)  Seen in 09/2020.. seen for rash on chest.. dx with shingles, was not painful. Dx with lyme disease by Dr. Lorelei Pont on 10/13/20 following hiking 09/2020( no tick seen)and resulting myalgia. + 2/3 IgM Lyme AB on Western blot with neg IgG  (and high CRP)and polyarthralgia x 3-4 weeks Treated with doxycycline for 1 month.  She had continued to have myalgia in arms and legs on trip to [place].Marland Kitchen continued to worsen.  Spoke with an MD in [place].Marland Kitchen gave 6 day taper of prednisone... symptoms entirely resolved while she was on the steroid... 2 day later after completing the course, myalgia came back.  A second course of steroids heped as well.  Now she  reports she has had only maybe 5-10 % percent improvement.  Neck stiffness has resolved. No HA,  no numbness, weakness, no confusion, no neuro changes.  Balance is decreased. Some fatigue, but not severe.  Pain is continuous through the day. Describe ain as all over.. more on left compared to right.  No redness, no swelling in joints.  " I feel like I have the flu" Cannot sleep at night when it is severe.    Using tylenol BID.Marland Kitchen aleve did not help much  GFR normal... cleared by Dr. Zara Chess to take NSAIDs per pt.        Relevant past medical, surgical, family and social history reviewed and updated as indicated. Interim medical history since our last visit reviewed. Allergies and medications reviewed and  updated. Outpatient Medications Prior to Visit  Medication Sig Dispense Refill   acetaminophen (TYLENOL) 650 MG CR tablet Take 1,300 mg by mouth every 8 (eight) hours.     atorvastatin (LIPITOR) 10 MG tablet TAKE 1 TABLET BY MOUTH DAILY 90 tablet 3   diphenhydramine-acetaminophen (TYLENOL PM) 25-500 MG TABS Take 1 tablet by mouth at bedtime as needed (sleep).      fexofenadine (ALLEGRA) 60 MG tablet Take 180 mg by mouth daily.     fluticasone (FLONASE) 50 MCG/ACT nasal spray Place 2 sprays into both nostrils daily as needed for allergies.      hyoscyamine (ANASPAZ) 0.125 MG TBDP Place 0.125 mg under the tongue every 6 (six) hours as needed for cramping.     Multiple Vitamins-Minerals (MULTIVITAMIN ADULT PO) Take 1 tablet by mouth daily.      omeprazole (PRILOSEC) 20 MG capsule Take 20 mg by mouth 2 (two) times daily.     predniSONE (DELTASONE) 10 MG tablet Take 10 mg by mouth daily with breakfast.     Probiotic Product (PROBIOTIC-10 PO) Take 1 capsule by mouth daily.      traZODone (DESYREL) 50 MG tablet Take 0.5-1 tablets (25-50 mg total) by mouth at bedtime as needed for sleep. (Patient not taking: Reported on 12/07/2020) 30 tablet 3  doxycycline (VIBRA-TABS) 100 MG tablet Take 1 tablet (100 mg total) by mouth 2 (two) times daily. 56 tablet 0   No facility-administered medications prior to visit.     Per HPI unless specifically indicated in ROS section below Review of Systems  Constitutional:  Negative for fatigue and fever.  HENT:  Negative for congestion.   Eyes:  Negative for pain.  Respiratory:  Negative for cough and shortness of breath.   Cardiovascular:  Negative for chest pain, palpitations and leg swelling.  Gastrointestinal:  Negative for abdominal pain.  Genitourinary:  Negative for dysuria and vaginal bleeding.  Musculoskeletal:  Negative for back pain.  Neurological:  Negative for syncope, light-headedness and headaches.  Psychiatric/Behavioral:  Negative for dysphoric  mood.   Objective:  BP 128/76   Pulse 71   Temp 98 F (36.7 C)   Resp 12   Ht 5\' 3"  (1.6 m)   Wt 138 lb 9 oz (62.9 kg)   SpO2 94%   BMI 24.55 kg/m   Wt Readings from Last 3 Encounters:  12/07/20 138 lb 9 oz (62.9 kg)  10/13/20 146 lb (66.2 kg)  08/03/20 149 lb (67.6 kg)      Physical Exam Constitutional:      General: She is not in acute distress.    Appearance: Normal appearance. She is well-developed. She is not ill-appearing or toxic-appearing.  HENT:     Head: Normocephalic.     Right Ear: Hearing, tympanic membrane, ear canal and external ear normal. Tympanic membrane is not erythematous, retracted or bulging.     Left Ear: Hearing, tympanic membrane, ear canal and external ear normal. Tympanic membrane is not erythematous, retracted or bulging.     Nose: No mucosal edema or rhinorrhea.     Right Sinus: No maxillary sinus tenderness or frontal sinus tenderness.     Left Sinus: No maxillary sinus tenderness or frontal sinus tenderness.     Mouth/Throat:     Pharynx: Uvula midline.  Eyes:     General: Lids are normal. Lids are everted, no foreign bodies appreciated.     Conjunctiva/sclera: Conjunctivae normal.     Pupils: Pupils are equal, round, and reactive to light.  Neck:     Thyroid: No thyroid mass or thyromegaly.     Vascular: No carotid bruit.     Trachea: Trachea normal.  Cardiovascular:     Rate and Rhythm: Normal rate and regular rhythm.     Pulses: Normal pulses.     Heart sounds: Normal heart sounds, S1 normal and S2 normal. No murmur heard.   No friction rub. No gallop.  Pulmonary:     Effort: Pulmonary effort is normal. No tachypnea or respiratory distress.     Breath sounds: Normal breath sounds. No decreased breath sounds, wheezing, rhonchi or rales.  Abdominal:     General: Bowel sounds are normal.     Palpations: Abdomen is soft.     Tenderness: There is no abdominal tenderness.  Musculoskeletal:     Cervical back: Normal range of motion and  neck supple.  Skin:    General: Skin is warm and dry.     Findings: No rash.  Neurological:     Mental Status: She is alert and oriented to person, place, and time.     GCS: GCS eye subscore is 4. GCS verbal subscore is 5. GCS motor subscore is 6.     Cranial Nerves: No cranial nerve deficit.     Sensory: No sensory  deficit.     Motor: No abnormal muscle tone.     Coordination: Coordination normal.     Gait: Gait normal.     Deep Tendon Reflexes: Reflexes are normal and symmetric.     Comments: Nml cerebellar exam   No papilledema  Psychiatric:        Mood and Affect: Mood is not anxious or depressed.        Speech: Speech normal.        Behavior: Behavior normal. Behavior is cooperative.        Thought Content: Thought content normal.        Cognition and Memory: Memory is not impaired. She does not exhibit impaired recent memory or impaired remote memory.        Judgment: Judgment normal.      Results for orders placed or performed in visit on 10/13/20  B. burgdorfi antibodies by WB  Result Value Ref Range   B burgdorferi IgG Abs (IB) NEGATIVE NEGATIVE   Lyme Disease 18 kD IgG NON-REACTIVE    Lyme Disease 23 kD IgG NON-REACTIVE    Lyme Disease 28 kD IgG NON-REACTIVE    Lyme Disease 30 kD IgG NON-REACTIVE    Lyme Disease 39 kD IgG NON-REACTIVE    Lyme Disease 41 kD IgG REACTIVE (A)    Lyme Disease 45 kD IgG NON-REACTIVE    Lyme Disease 58 kD IgG NON-REACTIVE    Lyme Disease 66 kD IgG NON-REACTIVE    Lyme Disease 93 kD IgG NON-REACTIVE    B burgdorferi IgM Abs (IB) POSITIVE (A) NEGATIVE   Lyme Disease 23 kD IgM REACTIVE (A)    Lyme Disease 39 kD IgM REACTIVE (A)    Lyme Disease 41 kD IgM NON-REACTIVE   [place] spotted fvr abs pnl(IgG+IgM)  Result Value Ref Range   RMSF IgG NOT DETECTED NOT DETECTED   RMSF IgM NOT DETECTED NOT DETECTED  Ehrlichia antibody panel  Result Value Ref Range   E. CHAFFEENSIS AB IGG <1:64 <1:64   E. CHAFFEENSIS AB IGM <1:20 <1:20    INTERPRETATION see note    COMMENT see note   VITAMIN D 25 Hydroxy (Vit-D Deficiency, Fractures)  Result Value Ref Range   VITD 39.23 30.00 - 100.00 ng/mL  CBC with Differential/Platelet  Result Value Ref Range   WBC 7.0 4.0 - 10.5 K/uL   RBC 4.20 3.87 - 5.11 Mil/uL   Hemoglobin 12.6 12.0 - 15.0 g/dL   HCT 38.6 36.0 - 46.0 %   MCV 91.9 78.0 - 100.0 fl   MCHC 32.7 30.0 - 36.0 g/dL   RDW 13.9 11.5 - 15.5 %   Platelets 343.0 150.0 - 400.0 K/uL   Neutrophils Relative % 61.5 43.0 - 77.0 %   Lymphocytes Relative 26.5 12.0 - 46.0 %   Monocytes Relative 10.5 3.0 - 12.0 %   Eosinophils Relative 0.7 0.0 - 5.0 %   Basophils Relative 0.8 0.0 - 3.0 %   Neutro Abs 4.3 1.4 - 7.7 K/uL   Lymphs Abs 1.8 0.7 - 4.0 K/uL   Monocytes Absolute 0.7 0.1 - 1.0 K/uL   Eosinophils Absolute 0.0 0.0 - 0.7 K/uL   Basophils Absolute 0.1 0.0 - 0.1 K/uL  TSH  Result Value Ref Range   TSH 1.56 0.35 - 5.50 uIU/mL  CK  Result Value Ref Range   Total CK 116 7 - 177 U/L  High sensitivity CRP  Result Value Ref Range   CRP, High Sensitivity 36.870 (H) 0.000 -  5.000 mg/L  Sedimentation rate  Result Value Ref Range   Sed Rate 17 0 - 30 mm/hr    This visit occurred during the SARS-CoV-2 public health emergency.  Safety protocols were in place, including screening questions prior to the visit, additional usage of staff PPE, and extensive cleaning of exam room while observing appropriate contact time as indicated for disinfecting solutions.   COVID 19 screen:  No recent travel or known exposure to COVID19 The patient denies respiratory symptoms of COVID 19 at this time. The importance of social distancing was discussed today.   Assessment and Plan    Problem List Items Addressed This Visit     Myalgia   Relevant Orders   Ambulatory referral to Rheumatology   Post-Lyme disease syndrome - Primary    Pt with no clear  inflammation in joints.. pain is mainly in muscles.  Given difficult diagnosis and minimal  improvement.. will refer to rheumatology for further eval, rule out other etiology and to solidify diagnosis and treatment.   Per [facility].. no clear benefit with continued antibiotics.. she completed 30 day course of doxy. No clear benefit for repeat serology.   Only effective treatment so far has been prednisone.Marland Kitchen given not a great long term plan  Will try course of meloxicam.       Relevant Orders   Ambulatory referral to Rheumatology   Meds ordered this encounter  Medications   meloxicam (MOBIC) 15 MG tablet    Sig: Take 1 tablet (15 mg total) by mouth daily.    Dispense:  30 tablet    Refill:  0     Eliezer Lofts, MD

## 2020-12-07 NOTE — Patient Instructions (Signed)
We will refer to rheumatology.  Try a trial of meloxicam.. call if pain not well controlled.

## 2020-12-07 NOTE — Assessment & Plan Note (Signed)
Pt with no clear  inflammation in joints.. pain is mainly in muscles.  Given difficult diagnosis and minimal improvement.. will refer to rheumatology for further eval, rule out other etiology and to solidify diagnosis and treatment.   Per [facility].. no clear benefit with continued antibiotics.. she completed 30 day course of doxy. No clear benefit for repeat serology.   Only effective treatment so far has been prednisone.Marland Kitchen given not a great long term plan  Will try course of meloxicam.

## 2020-12-10 MED ORDER — PREDNISONE 5 MG PO TABS: 5.0000 mg | ORAL_TABLET | Freq: Every day | ORAL | 0 refills | Status: DC

## 2021-04-01 ENCOUNTER — Other Ambulatory Visit: Payer: Self-pay | Admitting: Nephrology

## 2021-04-01 DIAGNOSIS — Q613 Polycystic kidney, unspecified: Secondary | ICD-10-CM

## 2021-04-14 ENCOUNTER — Ambulatory Visit
Admission: RE | Admit: 2021-04-14 | Discharge: 2021-04-14 | Disposition: A | Discharge status: Home / Self Care | Payer: Medicare Other | Source: Ambulatory Visit | Attending: Nephrology | Admitting: Nephrology

## 2021-04-14 DIAGNOSIS — Q613 Polycystic kidney, unspecified: Secondary | ICD-10-CM

## 2021-04-14 MED ORDER — GADOBENATE DIMEGLUMINE 529 MG/ML IV SOLN
15.0000 mL | Freq: Once | INTRAVENOUS | Status: AC | PRN
  Administered 2021-04-14: 15 mL via INTRAVENOUS

## 2021-07-21 LAB — HM DIABETES EYE EXAM

## 2021-07-27 ENCOUNTER — Other Ambulatory Visit: Payer: Self-pay | Admitting: *Deleted

## 2021-07-27 ENCOUNTER — Encounter: Payer: Self-pay | Admitting: Family Medicine

## 2021-07-28 ENCOUNTER — Ambulatory Visit (INDEPENDENT_AMBULATORY_CARE_PROVIDER_SITE_OTHER): Payer: Medicare Other

## 2021-07-28 VITALS — Wt 138.0 lb

## 2021-07-28 DIAGNOSIS — Z0000 Encounter for general adult medical examination without abnormal findings: Secondary | ICD-10-CM | POA: Diagnosis not present

## 2021-07-28 NOTE — Progress Notes (Signed)
Virtual Visit via Telephone Note  I connected with  Sydney Ortiz on 07/28/21 at 10:15 AM EDT by telephone and verified that I am speaking with the correct person using two identifiers.  Location: Patient: home Provider: [place] Persons participating in the virtual visit: [place]   I discussed the limitations, risks, security and privacy concerns of performing an evaluation and management service by telephone and the availability of in person appointments. The patient expressed understanding and agreed to proceed.  Interactive audio and video telecommunications were attempted between this nurse and patient, however failed, due to patient having technical difficulties OR patient did not have access to video capability.  We continued and completed visit with audio only.  Some vital signs may be absent or patient reported.   Dionisio David, LPN  Subjective:   Sydney Ortiz is a 76 y.o. female who presents for Medicare Annual (Subsequent) preventive examination.  Review of Systems           Objective:    There were no vitals filed for this visit. There is no height or weight on file to calculate BMI.     07/27/2020   10:40 AM 02/12/2020   11:58 AM 02/10/2020   12:09 PM 07/25/2019    9:52 AM 07/23/2018    8:21 AM 07/10/2017    8:29 AM 04/08/2017    2:32 PM  Advanced Directives  Does Patient Have a Medical Advance Directive? Yes Yes Yes Yes Yes Yes No  Type of Paramedic of [place];Living will [place];Living will [place];Living will [place];Living will [place];Living will [place];Living will   Does patient want to make changes to medical advance directive?  No - Patient declined No - Patient declined  No - Patient declined    Copy of [place] in Chart? No - copy requested Yes - validated most recent copy scanned  in chart (See row information) Yes - validated most recent copy scanned in chart (See row information) Yes - validated most recent copy scanned in chart (See row information) Yes - validated most recent copy scanned in chart (See row information) Yes   Would patient like information on creating a medical advance directive?       No - Patient declined    Current Medications (verified) Outpatient Encounter Medications as of 07/28/2021  Medication Sig   acetaminophen (TYLENOL) 650 MG CR tablet Take 1,300 mg by mouth every 8 (eight) hours.   atorvastatin (LIPITOR) 10 MG tablet TAKE 1 TABLET BY MOUTH DAILY   diphenhydramine-acetaminophen (TYLENOL PM) 25-500 MG TABS Take 1 tablet by mouth at bedtime as needed (sleep).    fexofenadine (ALLEGRA) 60 MG tablet Take 180 mg by mouth daily.   fluticasone (FLONASE) 50 MCG/ACT nasal spray Place 2 sprays into both nostrils daily as needed for allergies.    hyoscyamine (ANASPAZ) 0.125 MG TBDP Place 0.125 mg under the tongue every 6 (six) hours as needed for cramping.   Multiple Vitamins-Minerals (MULTIVITAMIN ADULT PO) Take 1 tablet by mouth daily.    omeprazole (PRILOSEC) 20 MG capsule Take 20 mg by mouth 2 (two) times daily.   predniSONE (DELTASONE) 5 MG tablet Take 1 tablet (5 mg total) by mouth daily with breakfast.   Probiotic Product (PROBIOTIC-10 PO) Take 1 capsule by mouth daily.    traZODone (DESYREL) 50 MG tablet Take 0.5-1 tablets (25-50 mg total) by mouth at  bedtime as needed for sleep. (Patient not taking: Reported on 12/07/2020)   No facility-administered encounter medications on file as of 07/28/2021.    Allergies (verified) Patient has no known allergies.   History: Past Medical History:  Diagnosis Date   AR (allergic rhinitis)    Asthma    Barrett esophagus    Breast cancer (HCC)    hx; s/p masectomy 1994   Carpal tunnel syndrome    Clostridium difficile infection 2015   Enlarged liver    Esophageal dysmotility    severe; has been  elevated by rheumatology at [facility] but no connective tissue disease diagnosis was made. was ANA positive but anti-SCL 7- negative . 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   Family history of adverse reaction to anesthesia    grandmother had problems but unsure what problem was   GERD (gastroesophageal reflux disease)    24 hr pH study did not show significant acid refulux   HLD (hyperlipidemia)    elevated triglycerides   Impaired fasting glucose    Migraine headache    Osteoarthritis    Paroxysmal SVT (supraventricular tachycardia) (HCC)    hx; 5 beat run noted on holter monitor in 2005. EF 65%   PKD (polycystic kidney disease)    PONV (postoperative nausea and vomiting)    Pre-diabetes    Past Surgical History:  Procedure Laterality Date   ABDOMINAL HYSTERECTOMY     abt CT test  6/07   bilateral cysts, larget on R 9.6 x 6.7 cm   APPENDECTOMY     BREAST RECONSTRUCTION  9/11   CATARACT EXTRACTION, BILATERAL Bilateral 2012, 2014   COLONOSCOPY  2015, 2016   colonoscopy/EGD  2005   EYE SURGERY     holter monitor  1/05   PVC, PACs, SVT   kidney surgery - cyst removal  3/06   KNEE ARTHROSCOPY WITH MEDIAL MENISECTOMY Right 02/12/2020   Procedure: Right knee partial medial menisectomy;  Surgeon: Hessie Knows, MD;  Location: [facility] ORS;  Service: Orthopedics;  Laterality: Right;   MASTECTOMY Right    PARTIAL HYSTERECTOMY  1983   due to fibroids   radial masectomy  1993   R side   right carpal tunnel  7/11   rotator cuff, right  2004   TONSILLECTOMY     UPPER GI ENDOSCOPY  2010, 09/2016   WRIST SURGERY Left 2017, 2018   fracture repair 2017; plates removed 8502   Family History  Problem Relation Age of Onset   Arthritis Father    Prostate cancer Father    Hypertension Father    Polycystic kidney disease Mother    CVA Mother    Dementia Mother    Heart Problems Mother        valve disease   Diabetes Other        DM - MU and MA    Heart attack Brother    Hypertension  Brother    Ulcers Brother    Social History   Socioeconomic History   Marital status: Widowed    Spouse name: Not on file   Number of children: Not on file   Years of education: Not on file   Highest education level: Not on file  Occupational History   Not on file  Tobacco Use   Smoking status: Former    Packs/day: 0.25    Years: 10.00    Pack years: 2.50    Types: Cigarettes    Quit date: 37    Years since quitting: 36.4  Smokeless tobacco: Never  Vaping Use   Vaping Use: Never used  Substance and Sexual Activity   Alcohol use: Yes    Comment: wine 3 times a week    Drug use: No   Sexual activity: Yes  Other Topics Concern   Not on file  Social History Narrative   Married, 2 adopted children (1B, 1G)   Originally from [place]; moved from [place] to [place].    Regular exercise - treadmill, 2 miles 3x/week; 4 miles daily    Retired - [employer]   Diet: avoids MSG (triggers HA), cultured cheese, coloring.       Pt signed designated party release form and gives Amiera Herzberg, spouse [phone] and, and son Zaidee Rion, [id number], access to medical records. Can leave msg on answering machine [phone] and on cell [phone].      Has living will, HCPOA husband, full code ( reviewed 2014)   Social Determinants of Health   Financial Resource Strain: Low Risk    Difficulty of Paying Living Expenses: Not hard at all  Food Insecurity: No Food Insecurity   Worried About Charity fundraiser in the Last Year: Never true   Arboriculturist in the Last Year: Never true  Transportation Needs: No Transportation Needs   Lack of Transportation (Medical): No   Lack of Transportation (Non-Medical): No  Physical Activity: Insufficiently Active   Days of Exercise per Week: 2 days   Minutes of Exercise per Session: 60 min  Stress: No Stress Concern Present   Feeling of Stress : Not at all  Social Connections: Not on file    Tobacco Counseling Counseling given: Not Answered   Clinical  Intake:  Pre-visit preparation completed: Yes  Pain : No/denies pain     Nutritional Risks: None Diabetes: No  How often do you need to have someone help you when you read instructions, pamphlets, or other written materials from your doctor or pharmacy?: 1 - Never  Diabetic?no  Interpreter Needed?: No  Information entered by :: Kirke Shaggy, LPN   Activities of Daily Living     View : No data to display.          Patient Care Team: Jinny Sanders, MD as PCP - General Thelma Comp, [place] as Consulting Physician (Optometry) Newell Coral., MD as Referring Physician (Gastroenterology) Donato Heinz, MD as Consulting Physician (Nephrology)  Indicate any recent Medical Services you may have received from other than [facility] providers in the past year (date may be approximate).     Assessment:   This is a routine wellness examination for Shanin.  Hearing/Vision screen No results found.  Dietary issues and exercise activities discussed:     Goals Addressed   None    Depression Screen    07/27/2020   10:41 AM 07/25/2019    9:54 AM 07/23/2018    8:07 AM 07/10/2017    8:22 AM 05/23/2016   10:23 AM 05/28/2015   12:01 PM 04/23/2014   11:41 AM  PHQ 2/9 Scores  PHQ - 2 Score 0 0 0 0 0 0 0  PHQ- 9 Score 0 0 0 0       Fall Risk    07/27/2020   10:40 AM 07/25/2019    9:53 AM 07/23/2018    8:07 AM 07/10/2017    8:22 AM 05/23/2016   10:23 AM  Fall Risk   Falls in the past year? 0 0 0 No No  Number falls in  past yr: 0 0     Injury with Fall? 0 0     Risk for fall due to : Medication side effect Medication side effect     Follow up Falls evaluation completed;Falls prevention discussed Falls evaluation completed;Falls prevention discussed       FALL RISK PREVENTION PERTAINING TO THE HOME:  Any stairs in or around the home? Yes  If so, are there any without handrails? No  Home free of loose throw rugs in walkways, pet beds, electrical cords, etc? Yes   Adequate lighting in your home to reduce risk of falls? Yes   ASSISTIVE DEVICES UTILIZED TO PREVENT FALLS:  Life alert? No  Use of a cane, walker or w/c? No  Grab bars in the bathroom? Yes  Shower chair or bench in shower? Yes  Elevated toilet seat or a handicapped toilet? Yes     Cognitive Function:declined 6CIT test         07/27/2020   10:44 AM 07/25/2019    9:57 AM 07/23/2018    8:07 AM 07/10/2017    8:26 AM 05/23/2016   10:23 AM  MMSE - Mini Mental State Exam  Not completed:  Refused     Orientation to time '5  5 5 5  '$ Orientation to Place '5  5 5 5  '$ Registration '3  3 3 3  '$ Attention/ Calculation 5  0 0 0  Recall '3  3 3 2  '$ Recall-comments     pt was unable to recall 1 of 3 words  Language- name 2 objects   0 0 0  Language- repeat '1  1 1 1  '$ Language- follow 3 step command   0 3 3  Language- read & follow direction   0 0 0  Write a sentence   0 0 0  Copy design   0 0 0  Total score   '17 20 19        '$ Immunizations Immunization History  Administered Date(s) Administered   Fluad Quad(high Dose 65+) 11/12/2018, 12/13/2019   Influenza Split 11/21/2010, 12/13/2011   Influenza Whole 01/04/2007, 12/02/2007, 12/02/2008, 12/29/2009   Influenza, High Dose Seasonal PF 12/14/2015, 12/04/2016, 12/31/2020   Influenza,inj,Quad PF,6+ Mos 12/11/2012, 01/21/2014, 12/19/2017   Influenza-Unspecified 01/01/2015   Moderna Covid-19 Vaccine Bivalent Booster 61yr & up 01/14/2021   Moderna SARS-COV2 Booster Vaccination 06/14/2020   Moderna Sars-Covid-2 Vaccination 03/20/2019, 04/17/2019, 01/16/2020   Pneumococcal Conjugate-13 02/09/2016   Pneumococcal Polysaccharide-23 06/04/2005, 12/11/2012   Td 01/05/2004   Tdap 08/04/2012   Zoster Recombinat (Shingrix) 01/07/2018, 06/15/2019   Zoster, Live 07/16/2009    TDAP status: Up to date  Flu Vaccine status: Up to date  Pneumococcal vaccine status: Up to date  Covid-19 vaccine status: Completed vaccines  Qualifies for Shingles  Vaccine? Yes   Zostavax completed Yes   Shingrix Completed?: Yes  Screening Tests Health Maintenance  Topic Date Due   INFLUENZA VACCINE  10/04/2021   MAMMOGRAM  10/13/2021   TETANUS/TDAP  08/05/2022   DEXA SCAN  08/28/2022   Pneumonia Vaccine 76 Years old  Completed   COVID-19 Vaccine  Completed   Hepatitis C Screening  Completed   Zoster Vaccines- Shingrix  Completed   HPV VACCINES  Aged Out   COLONOSCOPY (Pts [phone] coverage will need to be confirmed)  Discontinued    Health Maintenance  There are no preventive care reminders to display for this patient.  Colorectal cancer screening: Type of screening: Colonoscopy. Completed 09/11/14. Repeat every 10 years  Mammogram status: Completed 10/13/20. Repeat every year  Bone Density status: Completed 08/27/17. Results reflect: Bone density results: NORMAL. Repeat every 5 years.  Lung Cancer Screening: (Low Dose CT Chest recommended if Age 28-80 years, 30 pack-year currently smoking OR have quit w/in 15years.) does not qualify.    Additional Screening:  Hepatitis C Screening: does qualify; Completed 08/16/10  Vision Screening: Recommended annual ophthalmology exams for early detection of glaucoma and other disorders of the eye. Is the patient up to date with their annual eye exam?  Yes  Who is the provider or what is the name of the office in which the patient attends annual eye exams? [facility] If pt is not established with a provider, would they like to be referred to a provider to establish care? No .   Dental Screening: Recommended annual dental exams for proper oral hygiene  Community Resource Referral / Chronic Care Management: CRR required this visit?  No   CCM required this visit?  No      Plan:     I have personally reviewed and noted the following in the patient's chart:   Medical and social history Use of alcohol, tobacco or illicit drugs  Current medications and supplements including  opioid prescriptions.  Functional ability and status Nutritional status Physical activity Advanced directives List of other physicians Hospitalizations, surgeries, and ER visits in previous 12 months Vitals Screenings to include cognitive, depression, and falls Referrals and appointments  In addition, I have reviewed and discussed with patient certain preventive protocols, quality metrics, and best practice recommendations. A written personalized care plan for preventive services as well as general preventive health recommendations were provided to patient.     Dionisio David, LPN   7/37/1062   Nurse Notes: none

## 2021-07-28 NOTE — Patient Instructions (Signed)
Sydney Ortiz , Thank you for taking time to come for your Medicare Wellness Visit. I appreciate your ongoing commitment to your health goals. Please review the following plan we discussed and let me know if I can assist you in the future.   Screening recommendations/referrals: Colonoscopy: 09/11/14 Mammogram: 10/13/20 Bone Density: 08/27/17 Recommended yearly ophthalmology/optometry visit for glaucoma screening and checkup Recommended yearly dental visit for hygiene and checkup  Vaccinations: Influenza vaccine: 12/31/20 Pneumococcal vaccine: 02/09/16 Tdap vaccine: 08/04/12 Shingles vaccine: Zostavax 07/16/09  Shingrix 01/07/18, 06/15/19   Covid-19:03/20/19, 04/17/19, 01/16/20, 06/14/20, 01/14/21  Advanced directives: no  Conditions/risks identified: none  Next appointment: Follow up in one year for your annual wellness visit 08/01/22 @ 9:45am by phone   Preventive Care 65 Years and Older, Female Preventive care refers to lifestyle choices and visits with your health care provider that can promote health and wellness. What does preventive care include? A yearly physical exam. This is also called an annual well check. Dental exams once or twice a year. Routine eye exams. Ask your health care provider how often you should have your eyes checked. Personal lifestyle choices, including: Daily care of your teeth and gums. Regular physical activity. Eating a healthy diet. Avoiding tobacco and drug use. Limiting alcohol use. Practicing safe sex. Taking low-dose aspirin every day. Taking vitamin and mineral supplements as recommended by your health care provider. What happens during an annual well check? The services and screenings done by your health care provider during your annual well check will depend on your age, overall health, lifestyle risk factors, and family history of disease. Counseling  Your health care provider may ask you questions about your: Alcohol use. Tobacco use. Drug  use. Emotional well-being. Home and relationship well-being. Sexual activity. Eating habits. History of falls. Memory and ability to understand (cognition). Work and work Statistician. Reproductive health. Screening  You may have the following tests or measurements: Height, weight, and BMI. Blood pressure. Lipid and cholesterol levels. These may be checked every 5 years, or more frequently if you are over 11 years old. Skin check. Lung cancer screening. You may have this screening every year starting at age 43 if you have a 30-pack-year history of smoking and currently smoke or have quit within the past 15 years. Fecal occult blood test (FOBT) of the stool. You may have this test every year starting at age 25. Flexible sigmoidoscopy or colonoscopy. You may have a sigmoidoscopy every 5 years or a colonoscopy every 10 years starting at age 14. Hepatitis C blood test. Hepatitis B blood test. Sexually transmitted disease (STD) testing. Diabetes screening. This is done by checking your blood sugar (glucose) after you have not eaten for a while (fasting). You may have this done every 1-3 years. Bone density scan. This is done to screen for osteoporosis. You may have this done starting at age 66. Mammogram. This may be done every 1-2 years. Talk to your health care provider about how often you should have regular mammograms. Talk with your health care provider about your test results, treatment options, and if necessary, the need for more tests. Vaccines  Your health care provider may recommend certain vaccines, such as: Influenza vaccine. This is recommended every year. Tetanus, diphtheria, and acellular pertussis (Tdap, Td) vaccine. You may need a Td booster every 10 years. Zoster vaccine. You may need this after age 85. Pneumococcal 13-valent conjugate (PCV13) vaccine. One dose is recommended after age 62. Pneumococcal polysaccharide (PPSV23) vaccine. One dose is recommended after age  65. Talk to your health care provider about which screenings and vaccines you need and how often you need them. This information is not intended to replace advice given to you by your health care provider. Make sure you discuss any questions you have with your health care provider. Document Released: 03/19/2015 Document Revised: 11/10/2015 Document Reviewed: 12/22/2014 Elsevier Interactive Patient Education  2017 [place] Prevention in the Home Falls can cause injuries. They can happen to people of all ages. There are many things you can do to make your home safe and to help prevent falls. What can I do on the outside of my home? Regularly fix the edges of walkways and driveways and fix any cracks. Remove anything that might make you trip as you walk through a door, such as a raised step or threshold. Trim any bushes or trees on the path to your home. Use bright outdoor lighting. Clear any walking paths of anything that might make someone trip, such as rocks or tools. Regularly check to see if handrails are loose or broken. Make sure that both sides of any steps have handrails. Any raised decks and porches should have guardrails on the edges. Have any leaves, snow, or ice cleared regularly. Use sand or salt on walking paths during winter. Clean up any spills in your garage right away. This includes oil or grease spills. What can I do in the bathroom? Use night lights. Install grab bars by the toilet and in the tub and shower. Do not use towel bars as grab bars. Use non-skid mats or decals in the tub or shower. If you need to sit down in the shower, use a plastic, non-slip stool. Keep the floor dry. Clean up any water that spills on the floor as soon as it happens. Remove soap buildup in the tub or shower regularly. Attach bath mats securely with double-sided non-slip rug tape. Do not have throw rugs and other things on the floor that can make you trip. What can I do in the  bedroom? Use night lights. Make sure that you have a light by your bed that is easy to reach. Do not use any sheets or blankets that are too big for your bed. They should not hang down onto the floor. Have a firm chair that has side arms. You can use this for support while you get dressed. Do not have throw rugs and other things on the floor that can make you trip. What can I do in the kitchen? Clean up any spills right away. Avoid walking on wet floors. Keep items that you use a lot in easy-to-reach places. If you need to reach something above you, use a strong step stool that has a grab bar. Keep electrical cords out of the way. Do not use floor polish or wax that makes floors slippery. If you must use wax, use non-skid floor wax. Do not have throw rugs and other things on the floor that can make you trip. What can I do with my stairs? Do not leave any items on the stairs. Make sure that there are handrails on both sides of the stairs and use them. Fix handrails that are broken or loose. Make sure that handrails are as long as the stairways. Check any carpeting to make sure that it is firmly attached to the stairs. Fix any carpet that is loose or worn. Avoid having throw rugs at the top or bottom of the stairs. If you do have throw  rugs, attach them to the floor with carpet tape. Make sure that you have a light switch at the top of the stairs and the bottom of the stairs. If you do not have them, ask someone to add them for you. What else can I do to help prevent falls? Wear shoes that: Do not have high heels. Have rubber bottoms. Are comfortable and fit you well. Are closed at the toe. Do not wear sandals. If you use a stepladder: Make sure that it is fully opened. Do not climb a closed stepladder. Make sure that both sides of the stepladder are locked into place. Ask someone to hold it for you, if possible. Clearly mark and make sure that you can see: Any grab bars or  handrails. First and last steps. Where the edge of each step is. Use tools that help you move around (mobility aids) if they are needed. These include: Canes. Walkers. Scooters. Crutches. Turn on the lights when you go into a dark area. Replace any light bulbs as soon as they burn out. Set up your furniture so you have a clear path. Avoid moving your furniture around. If any of your floors are uneven, fix them. If there are any pets around you, be aware of where they are. Review your medicines with your doctor. Some medicines can make you feel dizzy. This can increase your chance of falling. Ask your doctor what other things that you can do to help prevent falls. This information is not intended to replace advice given to you by your health care provider. Make sure you discuss any questions you have with your health care provider. Document Released: 12/17/2008 Document Revised: 07/29/2015 Document Reviewed: 03/27/2014 Elsevier Interactive Patient Education  2017 [employer].

## 2021-07-29 ENCOUNTER — Encounter: Payer: Self-pay | Admitting: Family Medicine

## 2021-08-11 ENCOUNTER — Ambulatory Visit (INDEPENDENT_AMBULATORY_CARE_PROVIDER_SITE_OTHER): Payer: Medicare Other | Admitting: Family Medicine

## 2021-08-11 ENCOUNTER — Encounter: Payer: Self-pay | Admitting: Family Medicine

## 2021-08-11 VITALS — BP 118/60 | HR 65 | Temp 98.0°F | Resp 16 | Ht 63.0 in | Wt 138.6 lb

## 2021-08-11 DIAGNOSIS — E782 Mixed hyperlipidemia: Secondary | ICD-10-CM | POA: Diagnosis not present

## 2021-08-11 DIAGNOSIS — R7309 Other abnormal glucose: Secondary | ICD-10-CM | POA: Insufficient documentation

## 2021-08-11 DIAGNOSIS — J4520 Mild intermittent asthma, uncomplicated: Secondary | ICD-10-CM

## 2021-08-11 DIAGNOSIS — K22719 Barrett's esophagus with dysplasia, unspecified: Secondary | ICD-10-CM

## 2021-08-11 DIAGNOSIS — Z0000 Encounter for general adult medical examination without abnormal findings: Secondary | ICD-10-CM | POA: Diagnosis not present

## 2021-08-11 DIAGNOSIS — Q613 Polycystic kidney, unspecified: Secondary | ICD-10-CM | POA: Diagnosis not present

## 2021-08-11 DIAGNOSIS — M069 Rheumatoid arthritis, unspecified: Secondary | ICD-10-CM

## 2021-08-11 DIAGNOSIS — L089 Local infection of the skin and subcutaneous tissue, unspecified: Secondary | ICD-10-CM | POA: Insufficient documentation

## 2021-08-11 DIAGNOSIS — L723 Sebaceous cyst: Secondary | ICD-10-CM

## 2021-08-11 DIAGNOSIS — D849 Immunodeficiency, unspecified: Secondary | ICD-10-CM

## 2021-08-11 MED ORDER — MUPIROCIN 2 % EX OINT
1.0000 | TOPICAL_OINTMENT | Freq: Two times a day (BID) | CUTANEOUS | 0 refills | Status: DC
Start: 2021-08-11 — End: 2021-11-14

## 2021-08-11 NOTE — Progress Notes (Signed)
Patient ID: Sydney Ortiz, female    DOB: 14-Sep-1945, 76 y.o.   MRN: [id number]  This visit was conducted in person.  BP 118/60   Pulse 65   Temp 98 F (36.7 C)   Resp 16   Ht '5\' 3"'$  (1.6 m)   Wt 138 lb 9 oz (62.9 kg)   SpO2 96%   BMI 24.55 kg/m    CC:  Chief Complaint  Patient presents with   Annual Exam    Subjective:   HPI: Sydney Ortiz is a 76 y.o. female presenting on 08/11/2021 for Annual Exam  The patient presents for  complete physical and review of chronic health problems. He/She also has the following acute concerns today:  The patient saw a LPN or RN for medicare wellness visit.  Prevention and wellness was reviewed in detail. Note reviewed and important notes copied below.      She has had recent Dx of rheumatoid arthritis:  followed now by Dr. Posey Pronto.  06/23/21  On prednisone 5 mg daily and methotrexate 2.5 mg 8 tablets once a week.  Has follow up in 09/2021 Started last week PT... has helped.  Body mass index is 24.55 kg/m.  Elevated Cholesterol:  Due for re-eval Lab Results  Component Value Date   CHOL 185 07/27/2020   HDL 66.60 07/27/2020   LDLCALC 84 07/27/2020   LDLDIRECT 130.8 11/15/2009   TRIG 175.0 (H) 07/27/2020   CHOLHDL 3 07/27/2020  Using medications without problems: Muscle aches:  Diet compliance: good Exercise: regular Other complaints:   Mild intermittent asthma: [place], flonase control allergies.   Polycystic kidney disease followed by Renal. Dr. Zara Chess.  Hx of microscopic colitis:  no flares recent  EGD normal 2022, hx of barretts  Patient Care Team: Jinny Sanders, MD as PCP - General Thelma Comp, [place] as Consulting Physician (Optometry) Newell Coral., MD as Referring Physician (Gastroenterology) Donato Heinz, MD as Consulting Physician (Nephrology)  Relevant past medical, surgical, family and social history reviewed and updated as indicated. Interim medical history since our last visit  reviewed. Allergies and medications reviewed and updated. Outpatient Medications Prior to Visit  Medication Sig Dispense Refill   acetaminophen (TYLENOL) 650 MG CR tablet Take 1,300 mg by mouth every 8 (eight) hours.     atorvastatin (LIPITOR) 10 MG tablet TAKE 1 TABLET BY MOUTH DAILY 90 tablet 3   diphenhydramine-acetaminophen (TYLENOL PM) 25-500 MG TABS Take 1 tablet by mouth at bedtime as needed (sleep).      fexofenadine (ALLEGRA) 60 MG tablet Take 180 mg by mouth daily.     fluticasone (FLONASE) 50 MCG/ACT nasal spray Place 2 sprays into both nostrils daily as needed for allergies.      folic acid (FOLVITE) 1 MG tablet Take 1 mg by mouth daily.     hyoscyamine (ANASPAZ) 0.125 MG TBDP Place 0.125 mg under the tongue every 6 (six) hours as needed for cramping.     methotrexate (RHEUMATREX) 2.5 MG tablet Take 20 mg by mouth once a week.     Multiple Vitamins-Minerals (MULTIVITAMIN ADULT PO) Take 1 tablet by mouth daily.      omeprazole (PRILOSEC) 20 MG capsule Take 20 mg by mouth 2 (two) times daily.     predniSONE (DELTASONE) 5 MG tablet Take 1 tablet (5 mg total) by mouth daily with breakfast. 15 tablet 0   Probiotic Product (PROBIOTIC-10 PO) Take 1 capsule by mouth daily.      traZODone (DESYREL) 50 MG  tablet Take 0.5-1 tablets (25-50 mg total) by mouth at bedtime as needed for sleep. (Patient not taking: Reported on 12/07/2020) 30 tablet 3   No facility-administered medications prior to visit.     Per HPI unless specifically indicated in ROS section below Review of Systems  Constitutional:  Negative for fatigue and fever.  HENT:  Negative for congestion.   Eyes:  Negative for pain.  Respiratory:  Negative for cough and shortness of breath.   Cardiovascular:  Negative for chest pain, palpitations and leg swelling.  Gastrointestinal:  Negative for abdominal pain.  Genitourinary:  Negative for dysuria and vaginal bleeding.  Musculoskeletal:  Negative for back pain.  Skin:  Positive  for rash.  Neurological:  Negative for syncope, light-headedness and headaches.  Psychiatric/Behavioral:  Negative for dysphoric mood.     Objective:  BP 118/60   Pulse 65   Temp 98 F (36.7 C)   Resp 16   Ht '5\' 3"'$  (1.6 m)   Wt 138 lb 9 oz (62.9 kg)   SpO2 96%   BMI 24.55 kg/m   Wt Readings from Last 3 Encounters:  08/11/21 138 lb 9 oz (62.9 kg)  07/28/21 138 lb (62.6 kg)  12/07/20 138 lb 9 oz (62.9 kg)      Physical Exam Vitals and nursing note reviewed.  Constitutional:      General: She is not in acute distress.    Appearance: Normal appearance. She is well-developed. She is not ill-appearing or toxic-appearing.  HENT:     Head: Normocephalic.     Right Ear: Hearing, tympanic membrane, ear canal and external ear normal.     Left Ear: Hearing, tympanic membrane, ear canal and external ear normal.     Nose: Nose normal.  Eyes:     General: Lids are normal. Lids are everted, no foreign bodies appreciated.     Conjunctiva/sclera: Conjunctivae normal.     Pupils: Pupils are equal, round, and reactive to light.  Neck:     Thyroid: No thyroid mass or thyromegaly.     Vascular: No carotid bruit.     Trachea: Trachea normal.  Cardiovascular:     Rate and Rhythm: Normal rate and regular rhythm.     Heart sounds: Normal heart sounds, S1 normal and S2 normal. No murmur heard.    No gallop.  Pulmonary:     Effort: Pulmonary effort is normal. No respiratory distress.     Breath sounds: Normal breath sounds. No wheezing, rhonchi or rales.  Abdominal:     General: Bowel sounds are normal. There is no distension or abdominal bruit.     Palpations: Abdomen is soft. There is no fluid wave or mass.     Tenderness: There is no abdominal tenderness. There is no guarding or rebound.     Hernia: No hernia is present.  Musculoskeletal:     Cervical back: Normal range of motion and neck supple.  Lymphadenopathy:     Cervical: No cervical adenopathy.  Skin:    General: Skin is warm  and dry.     Findings: No rash.  Neurological:     Mental Status: She is alert.     Cranial Nerves: No cranial nerve deficit.     Sensory: No sensory deficit.  Psychiatric:        Mood and Affect: Mood is not anxious or depressed.        Speech: Speech normal.        Behavior: Behavior normal. Behavior is  cooperative.        Judgment: Judgment normal.       Results for orders placed or performed in visit on 07/27/21  HM DIABETES EYE EXAM  Result Value Ref Range   HM Diabetic Eye Exam No Retinopathy No Retinopathy     COVID 19 screen:  No recent travel or known exposure to [place] The patient denies respiratory symptoms of COVID 19 at this time. The importance of social distancing was discussed today.   Assessment and Plan   The patient's preventative maintenance and recommended screening tests for an annual wellness exam were reviewed in full today. Brought up to date unless services declined.  Counselled on the importance of diet, exercise, and its role in overall health and mortality. The patient's FH and SH was reviewed, including their home life, tobacco status, and drug and alcohol status.   Mammo/breast exam: personal history of breast cancer: stable 12/2020 Vaccines: Uptodate with PNA, flu, shingles, TDap, prevnar, COVID19 x 4 DVE/pap:partial hysterectomy, one ovary remains. DVE not indicated, no pap indicated.   Colon: 09/2014  bx microscopic colitis, repeat in 10 years Dr. Clydene Laming [facility]. DXA: 08/27/2017 normal.. repeat in 5 years Former smoker minimal use. Hep C negative Problem List Items Addressed This Visit     Barrett's esophagus    Resolved on PPI per recent endoscopy      Elevated glucose   Relevant Orders   Hemoglobin A1c   Glucose, Random   Hyperlipidemia    Due for reevaluation      Relevant Orders   Lipid panel   Immunocompromised patient ([place])    Due to methotrexate and prednisone      Inflamed sebaceous cyst    Acute  We will start with  warm compresses 3-4 times daily and topical mupirocin ointment.  If not draining and irritation worsening, will consider oral antibiotics and incision and drainage given she is immunocompromised with prednisone and methotrexate.      Mild intermittent asthma    Chronic, stable control  Rare issue on allergy treatment      Polycystic kidney disease    Chronic, stable control followed by nephrology Dr. Zara Chess      Rheumatoid arthritis [facility])     New diagnosis  Now followed by rheumatologist Dr. Posey Pronto.  Moderate control with PT, MTX and prednsione.  has follow up in 7 .2023.       Relevant Medications   methotrexate (RHEUMATREX) 2.5 MG tablet   Other Visit Diagnoses     Routine general medical examination at a health care facility    -  Primary          Eliezer Lofts, MD

## 2021-08-11 NOTE — Assessment & Plan Note (Signed)
Resolved on PPI per recent endoscopy

## 2021-08-11 NOTE — Assessment & Plan Note (Signed)
New diagnosis  Now followed by rheumatologist Dr. Posey Pronto.  Moderate control with PT, MTX and prednsione.  has follow up in 7 .2023.

## 2021-08-11 NOTE — Assessment & Plan Note (Signed)
Due for reevaluation 

## 2021-08-11 NOTE — Assessment & Plan Note (Signed)
Due to methotrexate and prednisone

## 2021-08-11 NOTE — Assessment & Plan Note (Signed)
Chronic, stable control  Rare issue on allergy treatment

## 2021-08-11 NOTE — Assessment & Plan Note (Signed)
Chronic, stable control followed by nephrology Dr. Zara Chess

## 2021-08-11 NOTE — Assessment & Plan Note (Signed)
Acute  We will start with warm compresses 3-4 times daily and topical mupirocin ointment.  If not draining and irritation worsening, will consider oral antibiotics and incision and drainage given she is immunocompromised with prednisone and methotrexate.

## 2021-08-11 NOTE — Patient Instructions (Addendum)
Start warm compresses for irritated subcutaneous cyst on left abdomen 3-4 times daily.  Start topical antibiotics.  Call with update early next week... if not improving we can try oral antibiotics and/or I/D.

## 2021-08-14 ENCOUNTER — Encounter: Payer: Self-pay | Admitting: Family Medicine

## 2021-08-15 ENCOUNTER — Telehealth: Payer: Self-pay

## 2021-08-15 NOTE — Telephone Encounter (Signed)
Error

## 2021-08-16 ENCOUNTER — Other Ambulatory Visit (INDEPENDENT_AMBULATORY_CARE_PROVIDER_SITE_OTHER): Payer: Medicare Other

## 2021-08-16 ENCOUNTER — Ambulatory Visit: Payer: Medicare Other | Admitting: Family Medicine

## 2021-08-16 ENCOUNTER — Encounter: Payer: Self-pay | Admitting: Family Medicine

## 2021-08-16 VITALS — BP 108/66 | HR 66 | Temp 98.0°F | Ht 63.0 in | Wt 143.5 lb

## 2021-08-16 DIAGNOSIS — E782 Mixed hyperlipidemia: Secondary | ICD-10-CM

## 2021-08-16 DIAGNOSIS — L089 Local infection of the skin and subcutaneous tissue, unspecified: Secondary | ICD-10-CM

## 2021-08-16 DIAGNOSIS — R7309 Other abnormal glucose: Secondary | ICD-10-CM | POA: Diagnosis not present

## 2021-08-16 DIAGNOSIS — L723 Sebaceous cyst: Secondary | ICD-10-CM

## 2021-08-16 LAB — HEMOGLOBIN A1C: Hgb A1c MFr Bld: 6.2 % (ref 4.6–6.5)

## 2021-08-16 LAB — LIPID PANEL
Cholesterol: 132 mg/dL (ref 0–200)
HDL: 55.6 mg/dL (ref >39.00)
LDL Cholesterol: 58 mg/dL (ref 0–99)
NonHDL: 76.87
Total CHOL/HDL Ratio: 2
Triglycerides: 96 mg/dL (ref 0.0–149.0)
VLDL: 19.2 mg/dL (ref 0.0–40.0)

## 2021-08-16 LAB — GLUCOSE, RANDOM: Glucose, Bld: 97 mg/dL (ref 70–99)

## 2021-08-16 MED ORDER — DOXYCYCLINE HYCLATE 100 MG PO TABS: 100.0000 mg | ORAL_TABLET | Freq: Two times a day (BID) | ORAL | 0 refills | Status: DC

## 2021-08-16 NOTE — Patient Instructions (Addendum)
24 hours after I and D.. remove any packing and wash with warm soapy water.  Complete 10 days of antibiotics.  We will call with culture results.  Go to ER if not tolerating antibiotic, fever develops, pain is increase.

## 2021-08-16 NOTE — Progress Notes (Signed)
Patient ID: Sydney Ortiz, female    DOB: 02-Feb-1946, 76 y.o.   MRN: [id number]  This visit was conducted in person.  BP 108/66   Pulse 66   Temp 98 F (36.7 C) (Oral)   Ht '5\' 3"'$  (1.6 m)   Wt 143 lb 8 oz (65.1 kg)   SpO2 94%   BMI 25.42 kg/m    CC:  Chief Complaint  Patient presents with   Follow-up    Inflamed Sebaceous Cyst    Subjective:   HPI: Sydney Ortiz is a 76 y.o. female presenting on 08/16/2021 for Follow-up (Inflamed Sebaceous Cyst)  Seen for sebaceous cyst, infection.. she has been treating with topical antibiotic and warm compresses.  Lesion on left abdomen.. increase in redness, increase pain. Small amount of discharge.  NO fever, no flulike symptoms.  Using tylenol for pain.      Relevant past medical, surgical, family and social history reviewed and updated as indicated. Interim medical history since our last visit reviewed. Allergies and medications reviewed and updated. Outpatient Medications Prior to Visit  Medication Sig Dispense Refill   acetaminophen (TYLENOL) 650 MG CR tablet Take 1,300 mg by mouth every 8 (eight) hours.     atorvastatin (LIPITOR) 10 MG tablet TAKE 1 TABLET BY MOUTH DAILY 90 tablet 3   diphenhydramine-acetaminophen (TYLENOL PM) 25-500 MG TABS Take 1 tablet by mouth at bedtime as needed (sleep).      fexofenadine (ALLEGRA) 60 MG tablet Take 180 mg by mouth daily.     fluticasone (FLONASE) 50 MCG/ACT nasal spray Place 2 sprays into both nostrils daily as needed for allergies.      folic acid (FOLVITE) 1 MG tablet Take 1 mg by mouth daily.     hyoscyamine (ANASPAZ) 0.125 MG TBDP Place 0.125 mg under the tongue every 6 (six) hours as needed for cramping.     methotrexate (RHEUMATREX) 2.5 MG tablet Take 20 mg by mouth once a week.     Multiple Vitamins-Minerals (MULTIVITAMIN ADULT PO) Take 1 tablet by mouth daily.      mupirocin ointment (BACTROBAN) 2 % Apply 1 application  topically 2 (two) times daily. 22 g 0   omeprazole  (PRILOSEC) 20 MG capsule Take 20 mg by mouth 2 (two) times daily.     predniSONE (DELTASONE) 5 MG tablet Take 1 tablet (5 mg total) by mouth daily with breakfast. 15 tablet 0   Probiotic Product (PROBIOTIC-10 PO) Take 1 capsule by mouth daily.      traZODone (DESYREL) 50 MG tablet Take 0.5-1 tablets (25-50 mg total) by mouth at bedtime as needed for sleep. (Patient not taking: Reported on 12/07/2020) 30 tablet 3   No facility-administered medications prior to visit.     Per HPI unless specifically indicated in ROS section below Review of Systems  Constitutional:  Negative for fatigue and fever.  HENT:  Negative for ear pain.   Eyes:  Negative for pain.  Respiratory:  Negative for chest tightness and shortness of breath.   Cardiovascular:  Negative for chest pain, palpitations and leg swelling.  Gastrointestinal:  Negative for abdominal pain.  Genitourinary:  Negative for dysuria.   Objective:  BP 108/66   Pulse 66   Temp 98 F (36.7 C) (Oral)   Ht '5\' 3"'$  (1.6 m)   Wt 143 lb 8 oz (65.1 kg)   SpO2 94%   BMI 25.42 kg/m   Wt Readings from Last 3 Encounters:  08/16/21 143 lb 8 oz (65.1 kg)  08/11/21 138 lb 9 oz (62.9 kg)  07/28/21 138 lb (62.6 kg)      Physical Exam     Results for orders placed or performed in visit on 07/27/21  HM DIABETES EYE EXAM  Result Value Ref Range   HM Diabetic Eye Exam No Retinopathy No Retinopathy     COVID 19 screen:  No recent travel or known exposure to [place] The patient denies respiratory symptoms of COVID 19 at this time. The importance of social distancing was discussed today.   Assessment and Plan I&D  Meds, vitals, and allergies reviewed.   Indication: suspect abscess/infected cyst sebaceous cyst   Pt complaints of: erythema, pain, swelling  Location: left lower abdomen  Size 3 cm erythematous lesion with central pustule and underlying fluctuance, surrounded by 2 cm additional milder erythema  Informed consent obtained.  Pt  aware of risks not limited to but including infection, bleeding, damage to near by organs.  Prep: etoh/betadine  Anesthesia: 1%lidocaine with epi, good effect  Incision made with #11 blade  Wound explored and loculations removed  Wound packed with iodoform gauze  Tolerated well  Routine postprocedure instructions d/w pt- remove packing in 24-48h, keep area clean and bandaged, follow up if concerns/spreading erythema/pain. We will treat with doxycycline 100 mg p.o. twice daily, warm compresses.  She will return for close follow-up in 2 to 3 days.     Eliezer Lofts, MD

## 2021-08-16 NOTE — Addendum Note (Signed)
Addended by: Carter Kitten on: 08/16/2021 04:21 PM   Modules accepted: Orders

## 2021-08-17 ENCOUNTER — Encounter: Payer: Self-pay | Admitting: Family Medicine

## 2021-08-19 ENCOUNTER — Ambulatory Visit: Payer: Medicare Other | Admitting: Family Medicine

## 2021-08-19 ENCOUNTER — Encounter: Payer: Self-pay | Admitting: Family Medicine

## 2021-08-19 VITALS — BP 110/64 | HR 67 | Temp 97.6°F | Resp 16 | Ht 63.0 in | Wt 139.1 lb

## 2021-08-19 DIAGNOSIS — L089 Local infection of the skin and subcutaneous tissue, unspecified: Secondary | ICD-10-CM

## 2021-08-19 DIAGNOSIS — D849 Immunodeficiency, unspecified: Secondary | ICD-10-CM | POA: Diagnosis not present

## 2021-08-19 DIAGNOSIS — R7309 Other abnormal glucose: Secondary | ICD-10-CM | POA: Diagnosis not present

## 2021-08-19 LAB — WOUND CULTURE
MICRO NUMBER:: 13519111
SPECIMEN QUALITY:: ADEQUATE

## 2021-08-19 NOTE — Assessment & Plan Note (Signed)
Reviewed labs in detail.  Glucose in normal range and A1c in treat prediabetes range.  She will continue working on low sugar low-carb diet.

## 2021-08-19 NOTE — Patient Instructions (Signed)
Apply topical antibiotic ointment , start warm compresses 3-4 times daily again to keep lesion draining. Complete doxycycline x 10 days.  Call if fever on antibiotics or redness returning.

## 2021-08-19 NOTE — Assessment & Plan Note (Signed)
Acute, improving status post I&D.  Lesion continues to drain, she will restart warm compresses and warm soaks.  She will apply topical antibiotic ointment and complete 10-day course of doxycycline.  Return and ER precautions were provided.

## 2021-08-19 NOTE — Progress Notes (Addendum)
Patient ID: Sydney Ortiz, female    DOB: 11-17-1945, 76 y.o.   MRN: [id number]  This visit was conducted in person.  BP 110/64   Pulse 67   Temp 97.6 F (36.4 C)   Resp 16   Ht '5\' 3"'$  (1.6 m)   Wt 139 lb 2 oz (63.1 kg)   SpO2 97%   BMI 24.64 kg/m    CC: Chief Complaint  Patient presents with   Cyst    Follow Still having discharge hurts little     Subjective:   HPI: Sydney Ortiz is a 76 y.o. female presenting on 08/19/2021 for Cyst (Follow Still having discharge hurts little )   She is on   da 2 of antibiotics doxycycline. She continues to have drainage at the site.  Small amount yellowish bloody.  Area is sore but better. Surrounding redness decreased.   No fever, no abdominal pain.   She has not  had any fever.   Reviewed recent labs from 08/16/2021 and recent wound culture with patient in detail.  Wound culture returned no growth but leukocytes and gram-positive cocci were seen.      Relevant past medical, surgical, family and social history reviewed and updated as indicated. Interim medical history since our last visit reviewed. Allergies and medications reviewed and updated. Outpatient Medications Prior to Visit  Medication Sig Dispense Refill   acetaminophen (TYLENOL) 650 MG CR tablet Take 1,300 mg by mouth every 8 (eight) hours.     atorvastatin (LIPITOR) 10 MG tablet TAKE 1 TABLET BY MOUTH DAILY 90 tablet 3   diphenhydramine-acetaminophen (TYLENOL PM) 25-500 MG TABS Take 1 tablet by mouth at bedtime as needed (sleep).      doxycycline (VIBRA-TABS) 100 MG tablet Take 1 tablet (100 mg total) by mouth 2 (two) times daily. 20 tablet 0   fexofenadine (ALLEGRA) 60 MG tablet Take 180 mg by mouth daily.     fluticasone (FLONASE) 50 MCG/ACT nasal spray Place 2 sprays into both nostrils daily as needed for allergies.      folic acid (FOLVITE) 1 MG tablet Take 1 mg by mouth daily.     hyoscyamine (ANASPAZ) 0.125 MG TBDP Place 0.125 mg under the tongue every 6 (six)  hours as needed for cramping.     methotrexate (RHEUMATREX) 2.5 MG tablet Take 20 mg by mouth once a week.     Multiple Vitamins-Minerals (MULTIVITAMIN ADULT PO) Take 1 tablet by mouth daily.      mupirocin ointment (BACTROBAN) 2 % Apply 1 application  topically 2 (two) times daily. 22 g 0   omeprazole (PRILOSEC) 20 MG capsule Take 20 mg by mouth 2 (two) times daily.     predniSONE (DELTASONE) 5 MG tablet Take 1 tablet (5 mg total) by mouth daily with breakfast. 15 tablet 0   Probiotic Product (PROBIOTIC-10 PO) Take 1 capsule by mouth daily.      No facility-administered medications prior to visit.     Per HPI unless specifically indicated in ROS section below Review of Systems  Constitutional:  Negative for fatigue and fever.  HENT:  Negative for congestion.   Eyes:  Negative for pain.  Respiratory:  Negative for cough and shortness of breath.   Cardiovascular:  Negative for chest pain, palpitations and leg swelling.  Gastrointestinal:  Negative for abdominal pain.  Genitourinary:  Negative for dysuria and vaginal bleeding.  Musculoskeletal:  Negative for back pain.  Neurological:  Negative for syncope, light-headedness and headaches.  Psychiatric/Behavioral:  Negative for dysphoric mood.    Objective:  BP 110/64   Pulse 67   Temp 97.6 F (36.4 C)   Resp 16   Ht '5\' 3"'$  (1.6 m)   Wt 139 lb 2 oz (63.1 kg)   SpO2 97%   BMI 24.64 kg/m   Wt Readings from Last 3 Encounters:  08/19/21 139 lb 2 oz (63.1 kg)  08/16/21 143 lb 8 oz (65.1 kg)  08/11/21 138 lb 9 oz (62.9 kg)      Physical Exam Constitutional:      General: She is not in acute distress.    Appearance: Normal appearance. She is well-developed. She is not ill-appearing or toxic-appearing.  HENT:     Head: Normocephalic.     Right Ear: Hearing, tympanic membrane, ear canal and external ear normal. Tympanic membrane is not erythematous, retracted or bulging.     Left Ear: Hearing, tympanic membrane, ear canal and  external ear normal. Tympanic membrane is not erythematous, retracted or bulging.     Nose: No mucosal edema or rhinorrhea.     Right Sinus: No maxillary sinus tenderness or frontal sinus tenderness.     Left Sinus: No maxillary sinus tenderness or frontal sinus tenderness.     Mouth/Throat:     Pharynx: Uvula midline.  Eyes:     General: Lids are normal. Lids are everted, no foreign bodies appreciated.     Conjunctiva/sclera: Conjunctivae normal.     Pupils: Pupils are equal, round, and reactive to light.  Neck:     Thyroid: No thyroid mass or thyromegaly.     Vascular: No carotid bruit.     Trachea: Trachea normal.  Cardiovascular:     Rate and Rhythm: Normal rate and regular rhythm.     Pulses: Normal pulses.     Heart sounds: Normal heart sounds, S1 normal and S2 normal. No murmur heard.    No friction rub. No gallop.  Pulmonary:     Effort: Pulmonary effort is normal. No tachypnea or respiratory distress.     Breath sounds: Normal breath sounds. No decreased breath sounds, wheezing, rhonchi or rales.  Abdominal:     General: Bowel sounds are normal.     Palpations: Abdomen is soft.     Tenderness: There is no abdominal tenderness.  Musculoskeletal:     Cervical back: Normal range of motion and neck supple.  Skin:    General: Skin is warm and dry.     Findings: No rash.     Comments: Small amount of sebaceous material expressed from lesion on left lower abdomen, continuing to drain, decreased erythema, firmness of tissue remains but no fluctuation palpated  Neurological:     Mental Status: She is alert.  Psychiatric:        Mood and Affect: Mood is not anxious or depressed.        Speech: Speech normal.        Behavior: Behavior normal. Behavior is cooperative.        Thought Content: Thought content normal.        Judgment: Judgment normal.       Results for orders placed or performed in visit on 08/16/21  WOUND CULTURE   Specimen: Wound  Result Value Ref Range    MICRO NUMBER: [id number]    SPECIMEN QUALITY: Adequate    SOURCE: SEBACEOUS CYST    STATUS: PRELIMINARY    GRAM STAIN:      Moderate White blood cells seen Few epithelial  cells Moderate Gram positive cocci in clusters   RESULT: No Growth      COVID 19 screen:  No recent travel or known exposure to [place] The patient denies respiratory symptoms of COVID 19 at this time. The importance of social distancing was discussed today.   Assessment and Plan    Problem List Items Addressed This Visit     Elevated glucose    Reviewed labs in detail.  Glucose in normal range and A1c in treat prediabetes range.  She will continue working on low sugar low-carb diet.      Immunocompromised patient ([place])    Increased risk of infection on methotrexate for rheumatoid arthritis.      Infected sebaceous cyst of skin - Primary    Acute, improving status post I&D.  Lesion continues to drain, she will restart warm compresses and warm soaks.  She will apply topical antibiotic ointment and complete 10-day course of doxycycline.  Return and ER precautions were provided.        Eliezer Lofts, MD

## 2021-08-19 NOTE — Assessment & Plan Note (Signed)
Increased risk of infection on methotrexate for rheumatoid arthritis.

## 2021-10-17 ENCOUNTER — Other Ambulatory Visit: Payer: Self-pay | Admitting: Family Medicine

## 2021-11-14 ENCOUNTER — Other Ambulatory Visit: Payer: Self-pay | Admitting: Family Medicine

## 2021-11-14 ENCOUNTER — Encounter: Payer: Self-pay | Admitting: Family Medicine

## 2021-11-14 DIAGNOSIS — Z1231 Encounter for screening mammogram for malignant neoplasm of breast: Secondary | ICD-10-CM

## 2021-11-14 NOTE — Telephone Encounter (Signed)
Kevzara added to medication list.

## 2021-11-30 IMAGING — DX DG KNEE COMPLETE 4+V*R*
4 series · 4 of 4 positions shown · non-contrast
Comparison: None.

CLINICAL DATA: Right knee pain.

EXAM:
RIGHT KNEE - COMPLETE 4+ VIEW

[knee ap]
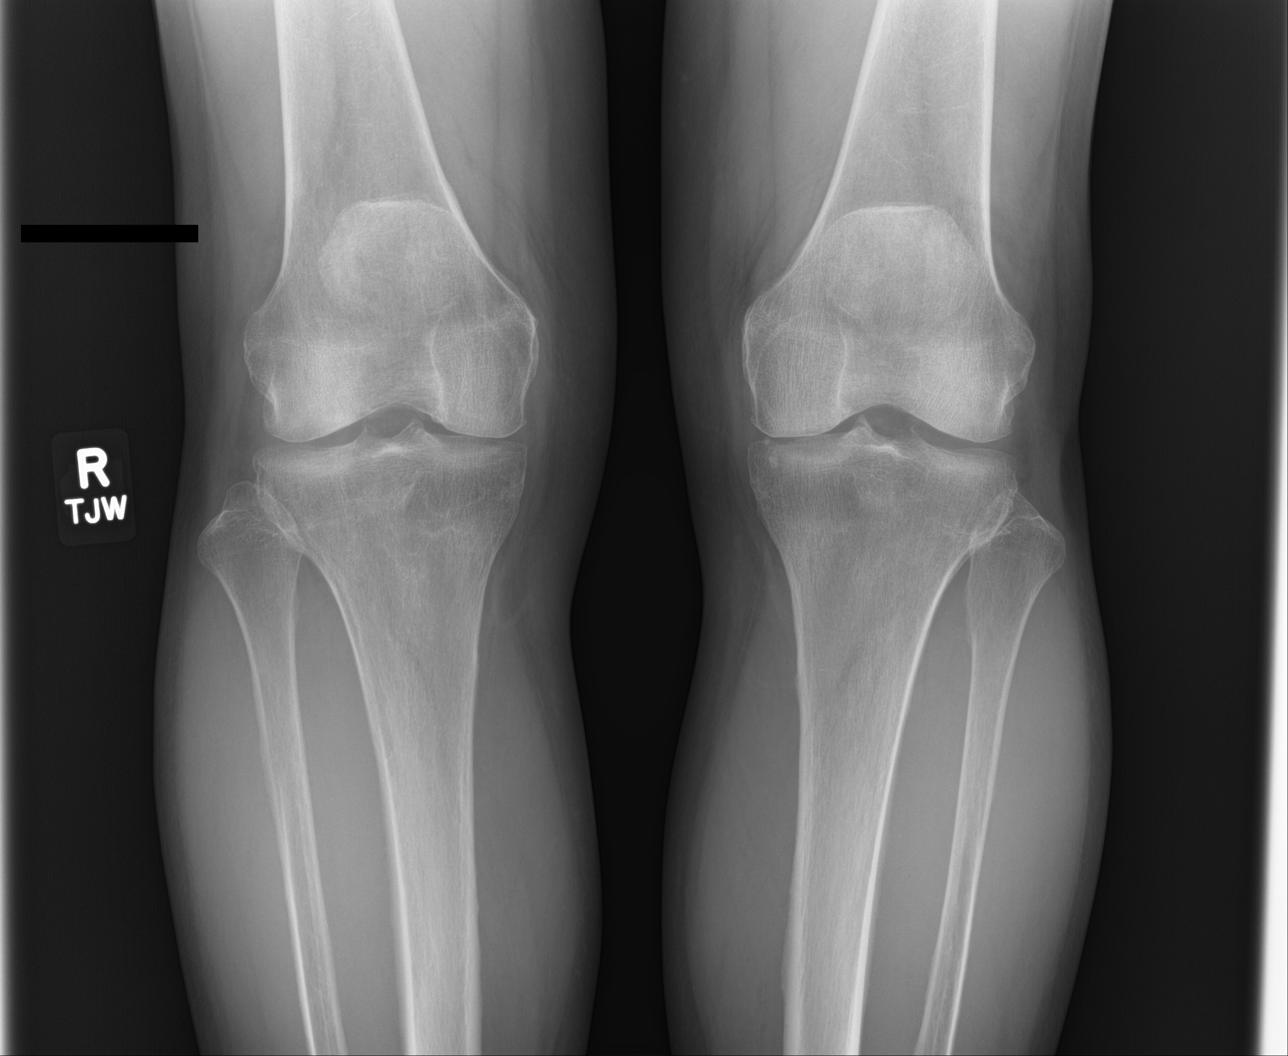

[knee tunnel]
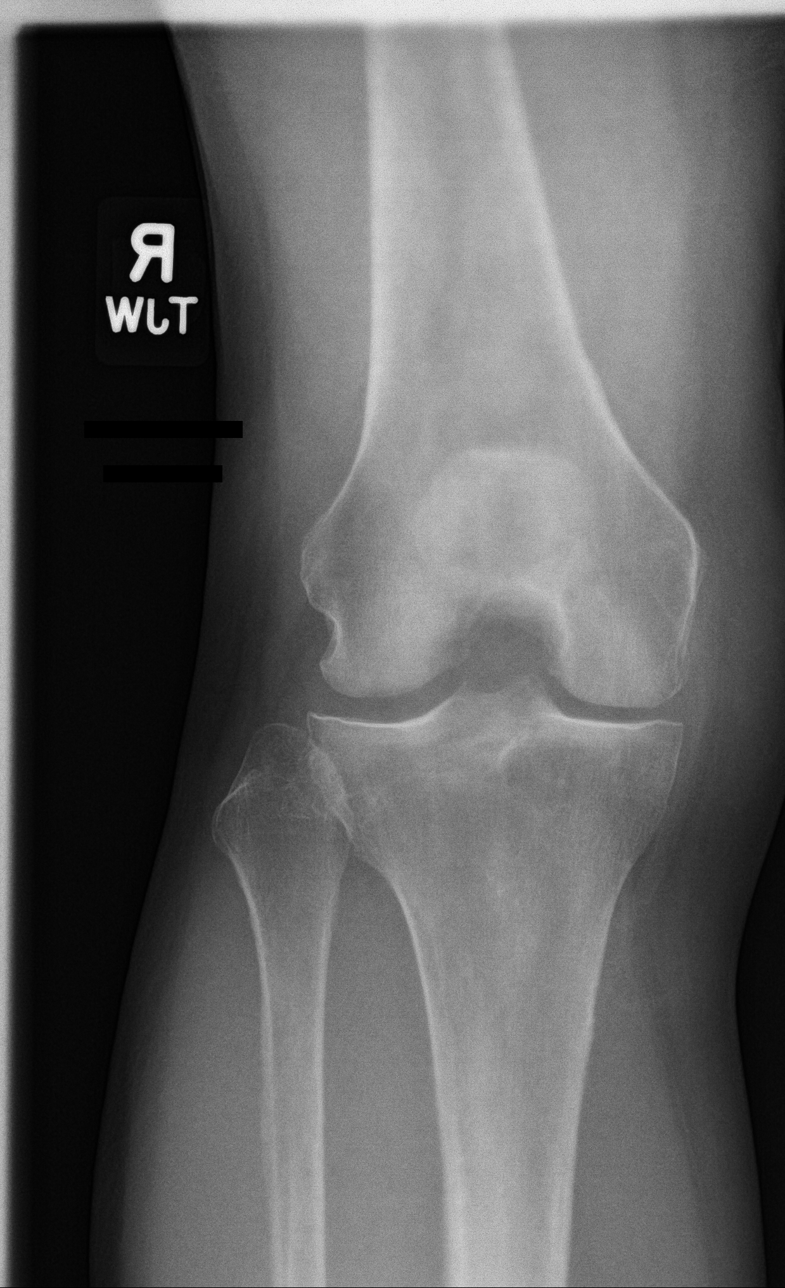

[knee lat]
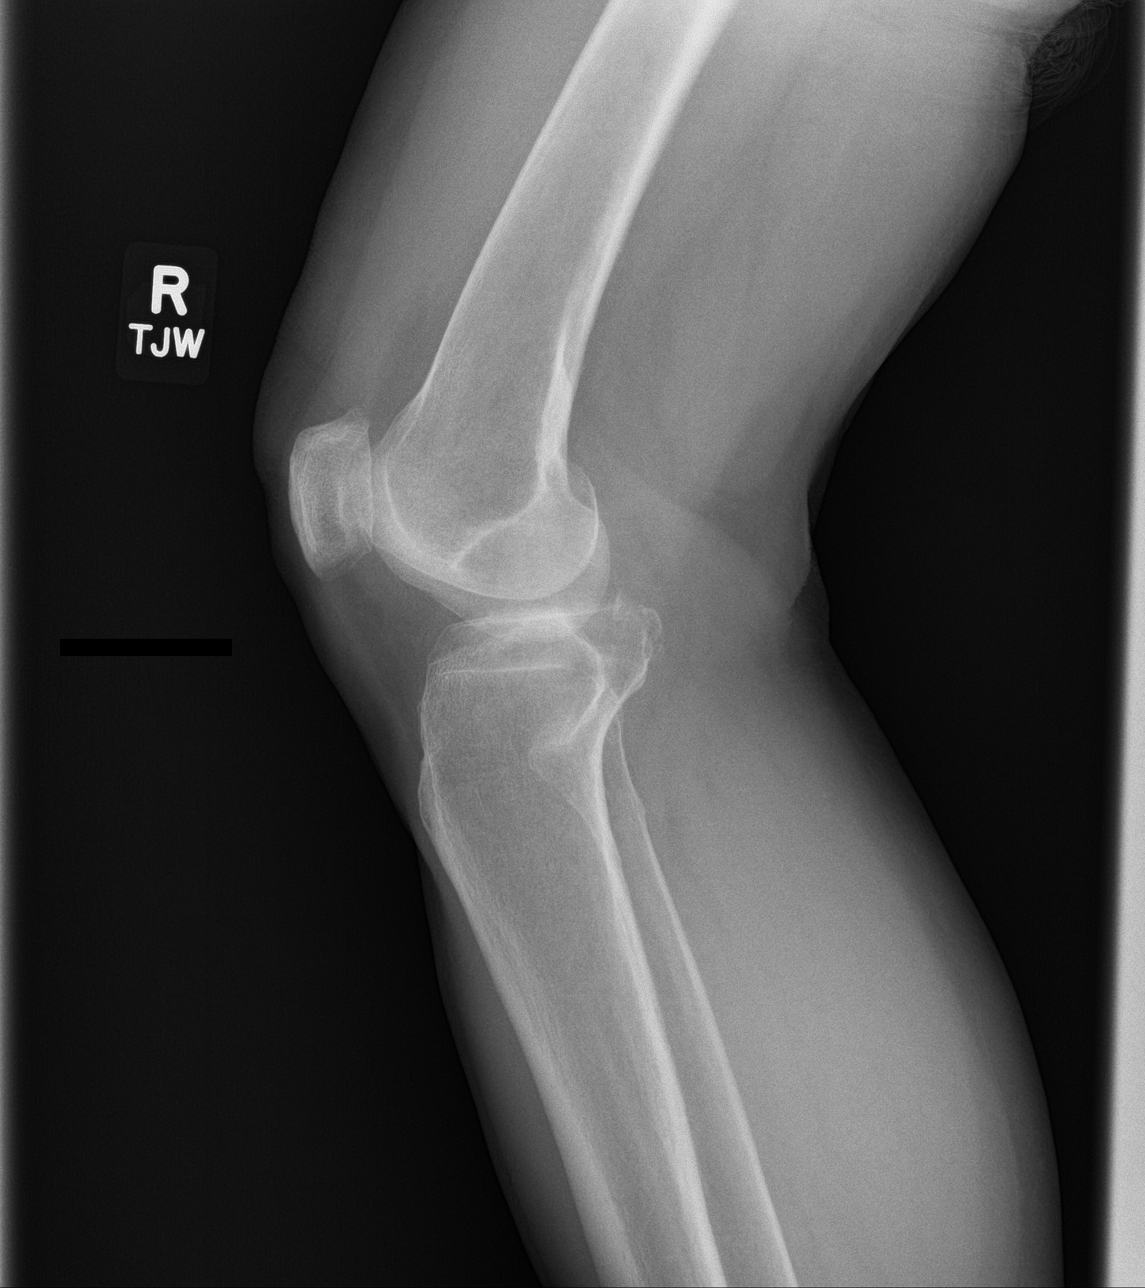

[patella skyline]
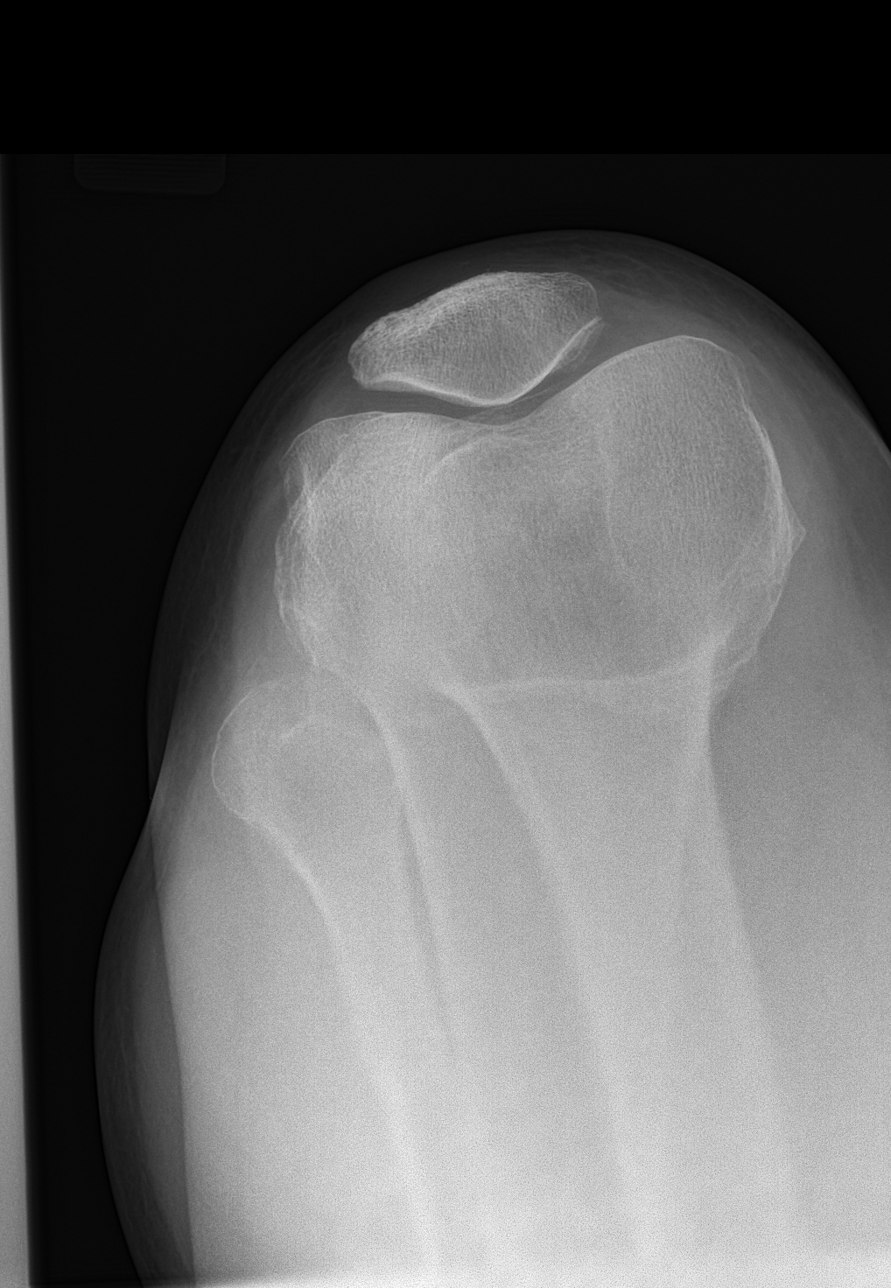

[4 of 4 positions shown; findings below may reference images not displayed]

FINDINGS: Imaging obtained weight-bearing. There is mild medial tibiofemoral
joint space narrowing. Mild tricompartmental peripheral spurring.
Patella is normally situated in the trochlear groove. No fracture,
erosion, periosteal reaction or evidence of focal bone lesion.
Contralateral left knee is included on the AP view for comparison,
mild spurring in the medial compartment. No focal soft tissue
abnormality.
IMPRESSION: Mild tricompartmental osteoarthritis, most prominent in the medial
tibiofemoral compartment.

## 2021-12-02 ENCOUNTER — Ambulatory Visit
Admission: RE | Admit: 2021-12-02 | Discharge: 2021-12-02 | Disposition: A | Discharge status: Home / Self Care | Payer: Medicare Other | Source: Ambulatory Visit | Attending: Family Medicine | Admitting: Family Medicine

## 2021-12-02 DIAGNOSIS — Z1231 Encounter for screening mammogram for malignant neoplasm of breast: Secondary | ICD-10-CM

## 2021-12-05 NOTE — Telephone Encounter (Signed)
Flu vaccine added to immunization record.  Prednisone removed from medication list.

## 2022-03-07 ENCOUNTER — Ambulatory Visit (INDEPENDENT_AMBULATORY_CARE_PROVIDER_SITE_OTHER): Payer: Medicare Other

## 2022-03-07 ENCOUNTER — Ambulatory Visit
Admission: EM | Admit: 2022-03-07 | Discharge: 2022-03-07 | Disposition: A | Discharge status: Home / Self Care | Payer: Medicare Other

## 2022-03-07 DIAGNOSIS — R0781 Pleurodynia: Secondary | ICD-10-CM

## 2022-03-07 DIAGNOSIS — S20211A Contusion of right front wall of thorax, initial encounter: Secondary | ICD-10-CM

## 2022-03-07 DIAGNOSIS — W19XXXA Unspecified fall, initial encounter: Secondary | ICD-10-CM

## 2022-03-07 NOTE — Discharge Instructions (Signed)
Pt declined [facility].

## 2022-03-07 NOTE — ED Triage Notes (Signed)
Patient presents to UC for fall today at about 1430. She states she tripped from step ladder and landed on her right side. Pain worse with movement. Concerned with rib injury. Wants to make sure she did not fracture anything since she's going on trip.

## 2022-03-07 NOTE — ED Provider Notes (Signed)
[facility]    CSN: [id number] Arrival date & time: 03/07/22  1623      History   Chief Complaint Chief Complaint  Patient presents with   Fall    HPI Sydney Ortiz is a 77 y.o. female Sydney Ortiz is a 77 y.o. female who presents for evaluation of an injury to her right ribs x today day(s). The injury occurred when patient was standing on the top step of a stepstool (had 2 steps) when she fell landing onto her right side.  Denies head injury or LOC.  Since that time she has been reporting right mid to lower anterior rib pain.  Denies bruising or swelling.  No shortness of breath or hemoptysis.  She is on methotrexate for rheumatoid arthritis.  She has history of breast reconstruction on the right secondary to breast cancer.  Denies any history of rib fractures in the past.  She took 2 Tylenol which did improve her pain.  She does not have pain with deep inspiration primarily with movement.  Denies any other injuries or concerns at this time.     Fall    Past Medical History:  Diagnosis Date   AR (allergic rhinitis)    Asthma    Barrett esophagus    Breast cancer (HCC)    hx; s/p masectomy 1994   Carpal tunnel syndrome    Clostridium difficile infection 2015   Enlarged liver    Esophageal dysmotility    severe; has been elevated by [facility]ology at [facility] but no connective tissue disease diagnosis was made. was ANA positive but anti-SCL 7- negative . 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   Family history of adverse reaction to anesthesia    grandmother had problems but unsure what problem was   GERD (gastroesophageal reflux disease)    24 hr pH study did not show significant acid refulux   HLD (hyperlipidemia)    elevated triglycerides   Impaired fasting glucose    Migraine headache    Osteoarthritis    Paroxysmal SVT (supraventricular tachycardia)    hx; 5 beat run noted on holter monitor in 2005. EF 65%   PKD (polycystic kidney disease)    PONV  (postoperative nausea and vomiting)    Pre-diabetes     Patient Active Problem List   Diagnosis Date Noted   Rheumatoid arthritis ([place]) 08/11/2021   Elevated glucose 08/11/2021   Immunocompromised patient ([place]) 08/11/2021   Infected sebaceous cyst of skin 08/11/2021   OSA (obstructive sleep apnea) 08/01/2019   Barrett's esophagus 07/17/2017   Counseling regarding end of life decision making 04/23/2014   Chronic diarrhea 12/12/2013   Chronic insomnia 04/08/2013   Fatty liver 05/10/2010   HYPERSPLENISM 04/29/2009   CARPAL TUNNEL SYNDROME, BILATERAL 03/26/2009   Hyperlipidemia 04/10/2007   MIGRAINE HEADACHE 04/10/2007   Allergic rhinitis 04/10/2007   Mild intermittent asthma 04/10/2007   GERD 04/10/2007   OSTEOARTHRITIS 04/10/2007   Polycystic kidney disease 04/10/2007   BREAST CANCER, HX OF 04/10/2007   SUPRAVENTRICULAR TACHYCARDIA, PAROXYSMAL, HX OF 04/10/2007    Past Surgical History:  Procedure Laterality Date   ABDOMINAL HYSTERECTOMY     abt CT test  6/07   bilateral cysts, larget on R 9.6 x 6.7 cm   APPENDECTOMY     BREAST RECONSTRUCTION  9/11   CATARACT EXTRACTION, BILATERAL Bilateral 2012, 2014   COLONOSCOPY  2015, 2016   colonoscopy/EGD  2005   EYE SURGERY     holter monitor  1/05   PVC, PACs, SVT  kidney surgery - cyst removal  3/06   KNEE ARTHROSCOPY WITH MEDIAL MENISECTOMY Right 02/12/2020   Procedure: Right knee partial medial menisectomy;  Surgeon: Hessie Knows, MD;  Location: [facility] ORS;  Service: Orthopedics;  Laterality: Right;   MASTECTOMY Right    PARTIAL HYSTERECTOMY  1983   due to fibroids   radial masectomy  1993   R side   right carpal tunnel  7/11   rotator cuff, right  2004   TONSILLECTOMY     UPPER GI ENDOSCOPY  2010, 09/2016   WRIST SURGERY Left 2017, 2018   fracture repair 2017; plates removed 2297    OB History   No obstetric history on file.      Home Medications    Prior to Admission medications   Medication Sig Start Date  End Date Taking? Authorizing Provider  methotrexate (RHEUMATREX) 2.5 MG tablet Take by mouth. 02/01/22 07/19/22 Yes [provider]  predniSONE (DELTASONE) 2.5 MG tablet Take by mouth. 02/01/22  Yes [provider]  sarilumab (KEVZARA) 200 MG/1.14ML SOSY Inject into the skin. 02/01/22  Yes [provider]  acetaminophen (TYLENOL) 650 MG CR tablet Take 1,300 mg by mouth every 8 (eight) hours.    [provider]  atorvastatin (LIPITOR) 10 MG tablet TAKE 1 TABLET BY MOUTH DAILY 10/18/21   Bedsole, Amy E, MD  diphenhydramine-acetaminophen (TYLENOL PM) 25-500 MG TABS Take 1 tablet by mouth at bedtime as needed (sleep).     [provider]  doxycycline (VIBRA-TABS) 100 MG tablet Take 1 tablet (100 mg total) by mouth 2 (two) times daily. 08/16/21   Bedsole, Amy E, MD  fexofenadine (ALLEGRA) 60 MG tablet Take 180 mg by mouth daily.    [provider]  fluticasone (FLONASE) 50 MCG/ACT nasal spray Place 2 sprays into both nostrils daily as needed for allergies.     [provider]  folic acid (FOLVITE) 1 MG tablet Take 1 mg by mouth daily. 07/14/21   [provider]  hyoscyamine (ANASPAZ) 0.125 MG TBDP Place 0.125 mg under the tongue every 6 (six) hours as needed for cramping.    [provider]  KEVZARA 200 MG/1.14ML SOSY SMARTSIG:SUB-Q 11/04/21   [provider]  methotrexate (RHEUMATREX) 2.5 MG tablet Take 20 mg by mouth once a week. 08/10/21   [provider]  Multiple Vitamins-Minerals (MULTIVITAMIN ADULT PO) Take 1 tablet by mouth daily.     [provider]  omeprazole (PRILOSEC) 20 MG capsule Take 20 mg by mouth 2 (two) times daily. 10/24/19   [provider]  Probiotic Product (PROBIOTIC-10 PO) Take 1 capsule by mouth daily.     [provider]    Family History Family History  Problem Relation Age of Onset   Arthritis Father    Prostate cancer Father    Hypertension Father     Polycystic kidney disease Mother    CVA Mother    Dementia Mother    Heart Problems Mother        valve disease   Diabetes Other        DM - MU and MA    Heart attack Brother    Hypertension Brother    Ulcers Brother     Social History Social History   Tobacco Use   Smoking status: Former    Packs/day: 0.25    Years: 10.00    Total pack years: 2.50    Types: Cigarettes    Quit date: 1986  Years since quitting: 38.0   Smokeless tobacco: Never  Vaping Use   Vaping Use: Never used  Substance Use Topics   Alcohol use: Yes    Comment: wine 3 times a week    Drug use: No     Allergies   Patient has no known allergies.   Review of Systems Review of Systems  Musculoskeletal:        Right rib pain      Physical Exam Triage Vital Signs ED Triage Vitals  Enc Vitals Group     BP --      Pulse Rate 03/07/22 1751 73     Resp 03/07/22 1751 16     Temp 03/07/22 1751 97.9 F (36.6 C)     Temp Source 03/07/22 1751 Temporal     SpO2 03/07/22 1751 95 %     Weight --      Height --      Head Circumference --      Peak Flow --      Pain Score 03/07/22 1750 4     Pain Loc --      Pain Edu? --      Excl. in [place]? --    No data found.  Updated Vital Signs BP (!) 161/88 (BP Location: Left Arm)   Pulse 73   Temp 97.9 F (36.6 C) (Temporal)   Resp 16   SpO2 95%   Visual Acuity Right Eye Distance:   Left Eye Distance:   Bilateral Distance:    Right Eye Near:   Left Eye Near:    Bilateral Near:     Physical Exam Vitals and nursing note reviewed.  Constitutional:      Appearance: Normal appearance.  HENT:     Head: Normocephalic and atraumatic.  Eyes:     Pupils: Pupils are equal, round, and reactive to light.  Cardiovascular:     Rate and Rhythm: Normal rate and regular rhythm.     Heart sounds: Normal heart sounds.  Pulmonary:     Effort: Pulmonary effort is normal. No respiratory distress.     Breath sounds: Normal breath sounds. No stridor. No  wheezing, rhonchi or rales.  Chest:     Chest wall: Tenderness present. No mass, deformity, swelling, crepitus or edema.    Skin:    General: Skin is warm and dry.  Neurological:     General: No focal deficit present.     Mental Status: She is alert and oriented to person, place, and time.  Psychiatric:        Mood and Affect: Mood normal.        Behavior: Behavior normal.      UC Treatments / Results  Labs (all labs ordered are listed, but only abnormal results are displayed) Labs Reviewed - No data to display  EKG   Radiology DG Ribs Unilateral W/Chest Right  Result Date: 03/07/2022 CLINICAL DATA:  Fall onto right side with anterior mid to lower rib pain. EXAM: RIGHT RIBS AND CHEST - 3+ VIEW COMPARISON:  Chest radiograph 11/22/2009 FINDINGS: The cardiomediastinal silhouette is within normal limits. There is no focal consolidation or pulmonary edema. There is no pleural effusion or pneumothorax No displaced rib fracture or other acute osseous abnormality is identified. Right axillary and breast surgical clips are noted. IMPRESSION: No displaced rib fracture or other acute osseous abnormality identified. Electronically Signed   By: Valetta Mole M.D.   On: 03/07/2022 18:42    Procedures Procedures (including  critical care time)  Medications Ordered in UC Medications - No data to display  Initial Impression / Assessment and Plan / UC Course  I have reviewed the triage vital signs and the nursing notes.  Pertinent labs & imaging results that were available during my care of the patient were reviewed by me and considered in my medical decision making (see chart for details).     Reviewed exam, symptoms, x-ray with patient. X-ray negative for fracture Discussed soft tissue injury/chest wall contusion secondary to fall Patient to take Tylenol as needed for pain.  No NSAIDs as she is on methotrexate Heat to the affected area Follow-up with PCP in 2 to 3 days for recheck ER  precautions reviewed and patient verbalized understanding Final Clinical Impressions(s) / UC Diagnoses   Final diagnoses:  Fall, initial encounter  Rib pain on right side  Contusion of right chest wall, initial encounter   Discharge Instructions   None    ED Prescriptions   None    PDMP not reviewed this encounter.   Melynda Ripple, NP 03/07/22 [phone]

## 2022-05-25 IMAGING — MG DIGITAL SCREENING UNILAT LEFT W/ TOMO W/ CAD
4 series · 4 of 12 positions shown · non-contrast
Comparison: Previous exam(s).
COMPARISON: Previous exam(s).

Addendum:
CLINICAL DATA: Screening.

EXAM:
DIGITAL SCREENING UNILATERAL LEFT MAMMOGRAM WITH CAD AND
TOMOSYNTHESIS
TECHNIQUE: Left screening digital craniocaudal and mediolateral oblique
mammograms were obtained. Left screening digital breast
tomosynthesis was performed. The images were evaluated with
computer-aided detection.

[L CC synth-2D]
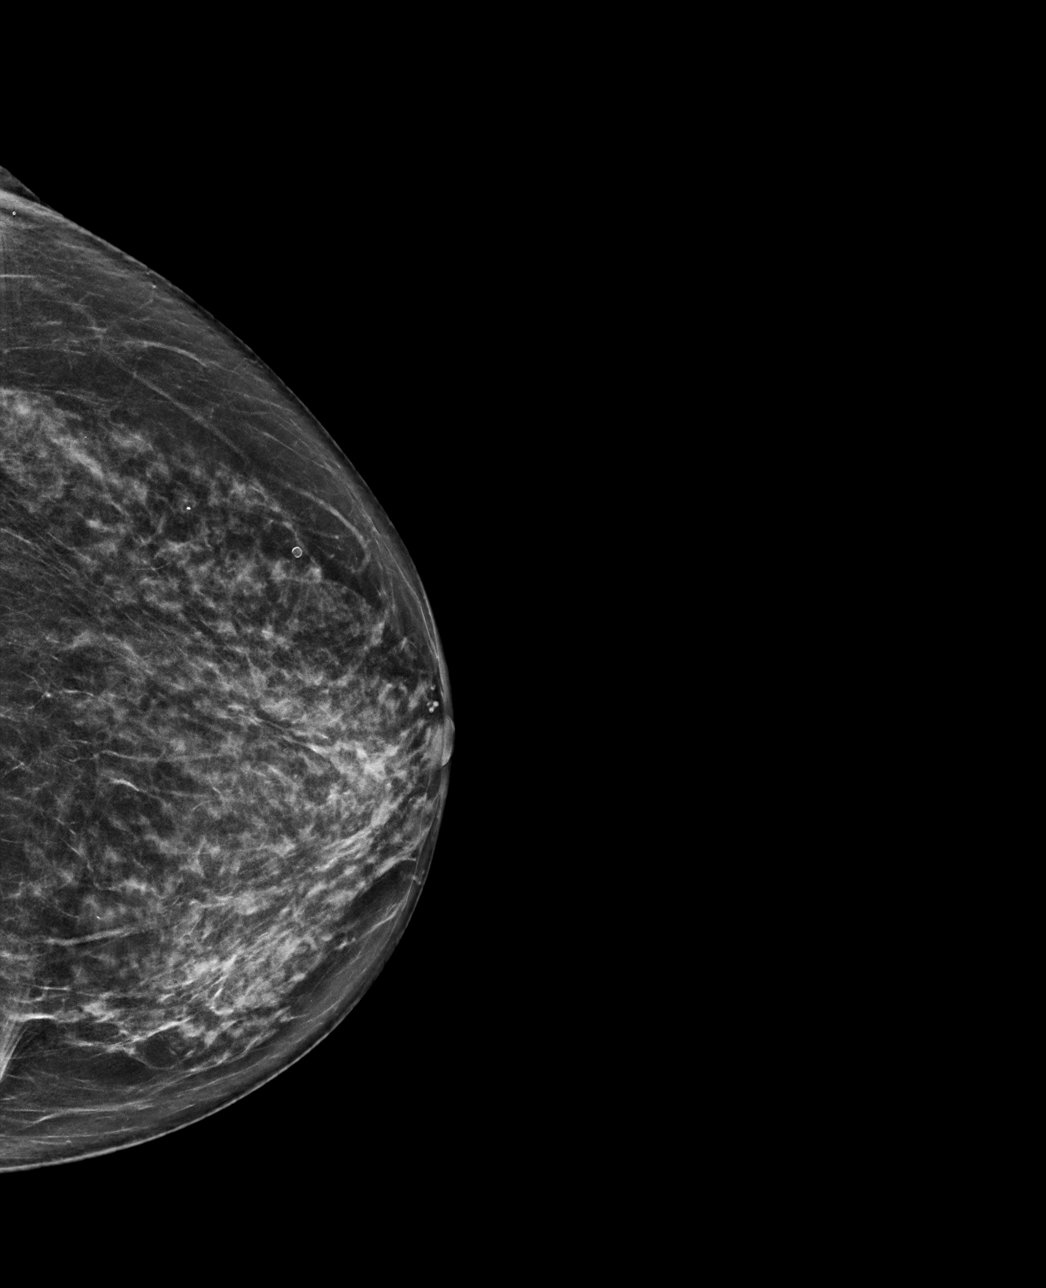

[L MLO synth-2D]
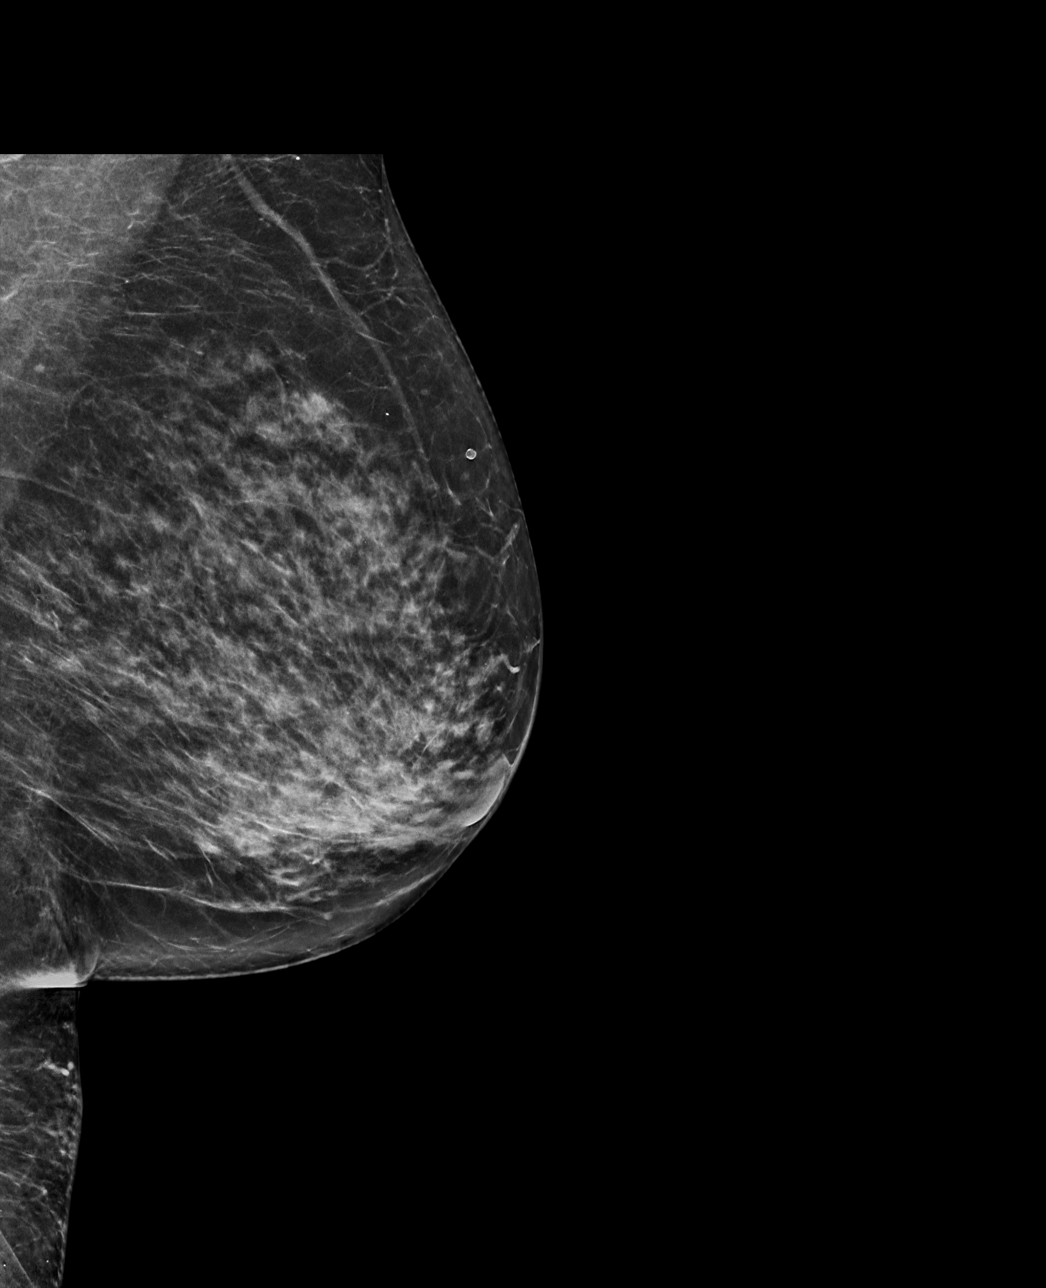

[L MLO tomo · tomo slice 34/67.0]
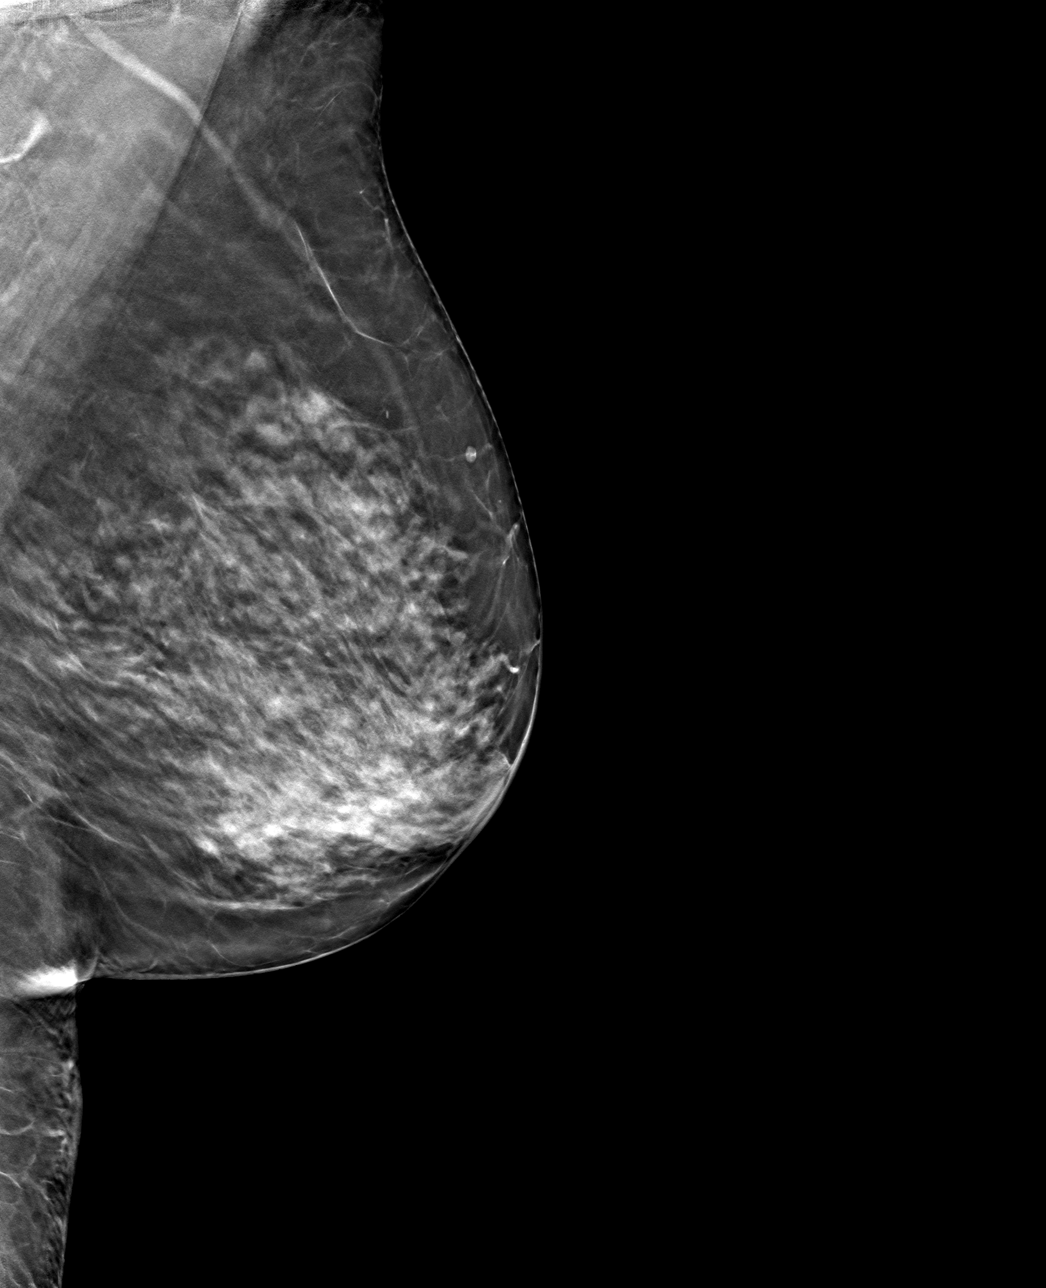

[L CC tomo · tomo slice 33/66.0]
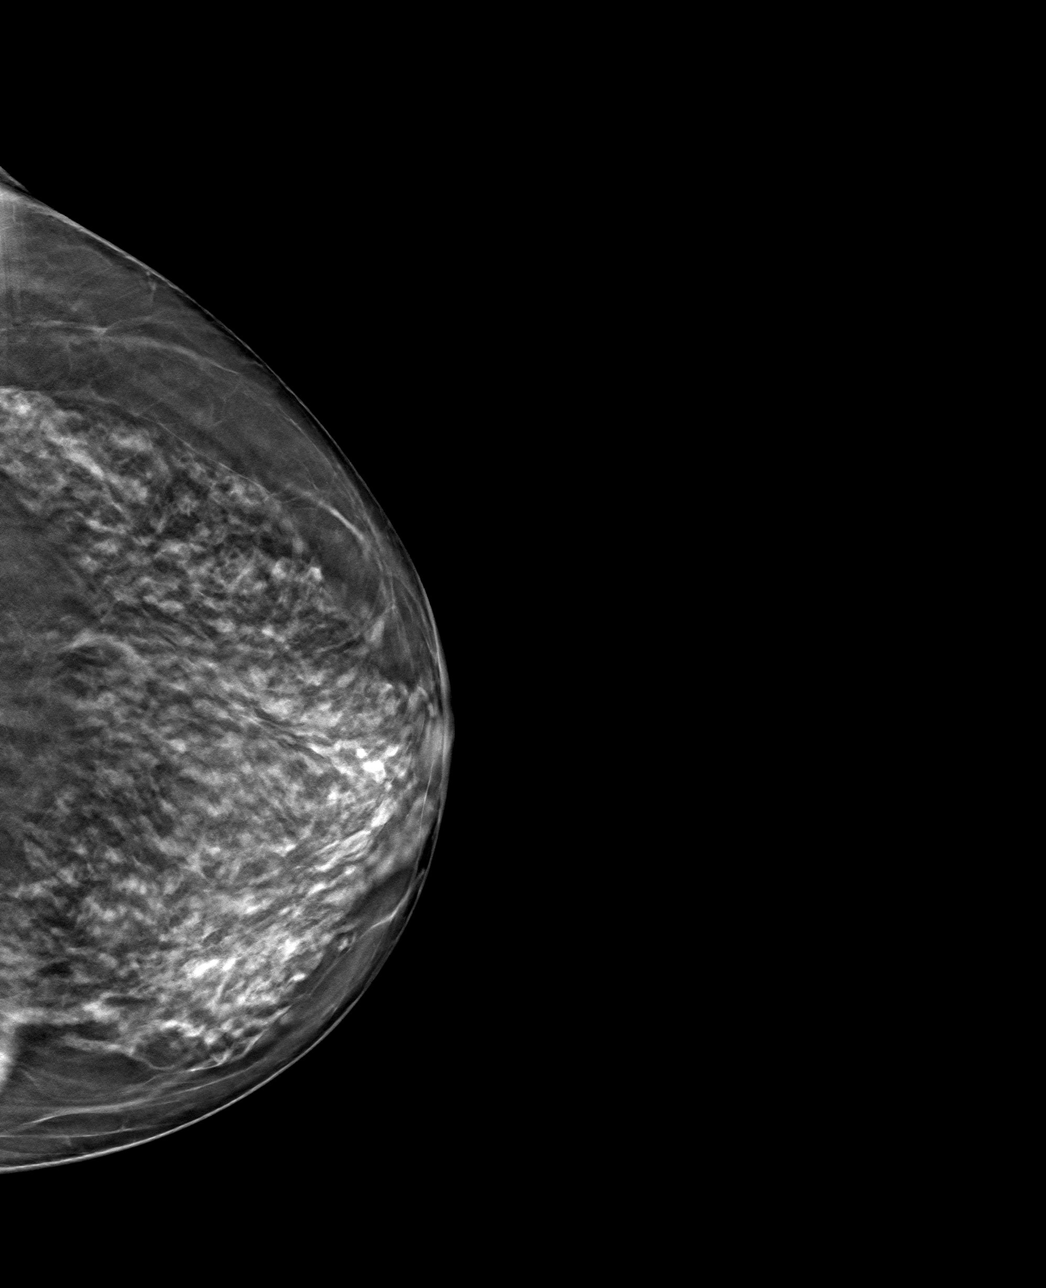

[4 of 12 positions shown; findings below may reference images not displayed]

ACR Breast Density Category c: The breast tissue is heterogeneously
dense, which may obscure small masses.
FINDINGS: The patient has had a left mastectomy. There are no findings
suspicious for malignancy.
IMPRESSION: No mammographic evidence of malignancy. A result letter of this
screening mammogram will be mailed directly to the patient.

RECOMMENDATION:
Screening mammogram in one year.  (Code:AF-Q-Y5F)

BI-RADS CATEGORY  1: Negative.

ADDENDUM:
Findings should state that the patient has had a right mastectomy.
There are no findings suspicious for malignancy in the left breast.

*** End of Addendum ***
ACR Breast Density Category c: The breast tissue is heterogeneously
dense, which may obscure small masses.
FINDINGS: The patient has had a left mastectomy. There are no findings
suspicious for malignancy.
IMPRESSION: No mammographic evidence of malignancy. A result letter of this
screening mammogram will be mailed directly to the patient.

RECOMMENDATION:
Screening mammogram in one year.  (Code:AF-Q-Y5F)

BI-RADS CATEGORY  1: Negative.

## 2022-06-28 ENCOUNTER — Telehealth: Payer: Self-pay | Admitting: Family Medicine

## 2022-06-28 NOTE — Telephone Encounter (Signed)
Contacted Sydney Ortiz to schedule their annual wellness visit. Appointment made for 08/08/2022. Left a detailed message informing patient of date and time change of her AWV appt.  [facility] Care Guide [facility] AWV TEAM Direct Dial: [phone]

## 2022-07-19 ENCOUNTER — Other Ambulatory Visit: Payer: Self-pay | Admitting: Family Medicine

## 2022-07-19 NOTE — Telephone Encounter (Signed)
Please schedule CPE with fasting labs prior with Dr. Ermalene Searing after 08/12/2022.  Already has MWV scheduled.

## 2022-07-19 NOTE — Telephone Encounter (Signed)
Spoke to pt, pt stated she currently had covid & would contact us back to schedule cpe. Pt stated she thought the telephone call with the nurse advisor on 6/4 was the actual cpe with Dr. Ermalene Searing, told pt it was just a telephone call only. Pt's phone connection was bad, call got disconnected.

## 2022-07-25 ENCOUNTER — Telehealth: Payer: Self-pay | Admitting: *Deleted

## 2022-07-25 DIAGNOSIS — E782 Mixed hyperlipidemia: Secondary | ICD-10-CM

## 2022-07-25 DIAGNOSIS — E559 Vitamin D deficiency, unspecified: Secondary | ICD-10-CM

## 2022-07-25 NOTE — Telephone Encounter (Signed)
-----   Message from Alvina Chou sent at 07/24/2022  3:42 PM EDT ----- Regarding: Lab orders for Friday, 6.7.24 Patient is scheduled for CPX labs, please order future labs, Thanks , Camelia Eng

## 2022-08-11 ENCOUNTER — Other Ambulatory Visit (INDEPENDENT_AMBULATORY_CARE_PROVIDER_SITE_OTHER): Payer: Medicare Other

## 2022-08-11 DIAGNOSIS — E559 Vitamin D deficiency, unspecified: Secondary | ICD-10-CM

## 2022-08-11 DIAGNOSIS — E782 Mixed hyperlipidemia: Secondary | ICD-10-CM

## 2022-08-11 LAB — LIPID PANEL
Cholesterol: 162 mg/dL (ref 0–200)
HDL: 56.8 mg/dL (ref >39.00)
LDL Cholesterol: 83 mg/dL (ref 0–99)
NonHDL: 105.17
Total CHOL/HDL Ratio: 3
Triglycerides: 113 mg/dL (ref 0.0–149.0)
VLDL: 22.6 mg/dL (ref 0.0–40.0)

## 2022-08-11 LAB — COMPREHENSIVE METABOLIC PANEL
ALT: 21 U/L (ref 0–35)
AST: 21 U/L (ref 0–37)
Albumin: 4.5 g/dL (ref 3.5–5.2)
Alkaline Phosphatase: 37 U/L — ABNORMAL LOW (ref 39–117)
BUN: 17 mg/dL (ref 6–23)
CO2: 29 mEq/L (ref 19–32)
Calcium: 9.5 mg/dL (ref 8.4–10.5)
Chloride: 106 mEq/L (ref 96–112)
Creatinine, Ser: 0.73 mg/dL (ref 0.40–1.20)
GFR: 79.43 mL/min (ref >60.00)
Glucose, Bld: 90 mg/dL (ref 70–99)
Potassium: 4.1 mEq/L (ref 3.5–5.1)
Sodium: 142 mEq/L (ref 135–145)
Total Bilirubin: 0.6 mg/dL (ref 0.2–1.2)
Total Protein: 6.5 g/dL (ref 6.0–8.3)

## 2022-08-11 LAB — VITAMIN D 25 HYDROXY (VIT D DEFICIENCY, FRACTURES): VITD: 57.47 ng/mL (ref 30.00–100.00)

## 2022-08-11 NOTE — Progress Notes (Signed)
No critical labs need to be addressed urgently. We will discuss labs in detail at upcoming office visit.   

## 2022-08-15 LAB — HM DIABETES EYE EXAM

## 2022-08-16 ENCOUNTER — Encounter: Payer: Self-pay | Admitting: Family Medicine

## 2022-08-18 ENCOUNTER — Encounter: Payer: Medicare Other | Admitting: Family Medicine

## 2022-08-29 ENCOUNTER — Ambulatory Visit (INDEPENDENT_AMBULATORY_CARE_PROVIDER_SITE_OTHER): Payer: Medicare Other | Admitting: Family Medicine

## 2022-08-29 ENCOUNTER — Encounter: Payer: Self-pay | Admitting: Family Medicine

## 2022-08-29 VITALS — BP 100/68 | HR 67 | Temp 97.8°F | Ht 62.0 in | Wt 144.4 lb

## 2022-08-29 DIAGNOSIS — Z0000 Encounter for general adult medical examination without abnormal findings: Secondary | ICD-10-CM | POA: Diagnosis not present

## 2022-08-29 NOTE — Progress Notes (Signed)
Patient ID: Sheniece Ruggles, female    DOB: August 22, 1945, 77 y.o.   MRN: [id number]  This visit was conducted in person.  BP 100/68 (BP Location: Left Arm, Patient Position: Sitting, Cuff Size: Normal)   Pulse 67   Temp 97.8 F (36.6 C) (Temporal)   Ht 5\' 2"  (1.575 m)   Wt 144 lb 6 oz (65.5 kg)   SpO2 94%   BMI 26.41 kg/m    CC:  Chief Complaint  Patient presents with  . Annual Exam    MWV scheduled 08/30/22    Subjective:   HPI: Zarielle Cea is a 77 y.o. female presenting on 08/29/2022 for Annual Exam (MWV scheduled 08/30/22)  The patient presents for  complete physical and review of chronic health problems. He/She also has the following acute concerns today:  The patient saw a LPN or RN for medicare wellness visit.  Prevention and wellness was reviewed in detail. Note reviewed and important notes copied below.   In May 2024 she had COVID.Marland Kitchen symptoms resolved.   Treated for Cellulitits in  [facility]  Cephalexin 500 mg BID x 7 days... leg cleared up.   Has dull pain in left frontal  Occ redness of conjunctiva  Has congestion.  No fever.  Treated by eye MD with antibiotic drops... no improvement with eye drops.      She has had recent Dx of rheumatoid arthritis:  followed now by Dr. Allena Katz.  On  methotrexate and Kevzara injections  Has been doing better.  Body mass index is 26.41 kg/m.  Elevated Cholesterol:   Lab Results  Component Value Date   CHOL 162 08/11/2022   HDL 56.80 08/11/2022   LDLCALC 83 08/11/2022   LDLDIRECT 130.8 11/15/2009   TRIG 113.0 08/11/2022   CHOLHDL 3 08/11/2022  Using medications without problems: Muscle aches:  Diet compliance: good Exercise: regular Other complaints:   Mild intermittent asthma: Allegra, flonase control allergies.   Polycystic kidney disease followed by Renal. Dr. Tyrone Schimke.  Hx of microscopic colitis:  no flares recent  EGD normal 2022, hx of barretts  Patient Care Team: Excell Seltzer, MD as PCP -  General Blair Promise, OD as Consulting Physician (Optometry) Rutherford Guys., MD as Referring Physician (Gastroenterology) Terrial Rhodes, MD as Consulting Physician (Nephrology)  Relevant past medical, surgical, family and social history reviewed and updated as indicated. Interim medical history since our last visit reviewed. Allergies and medications reviewed and updated. Outpatient Medications Prior to Visit  Medication Sig Dispense Refill  . acetaminophen (TYLENOL) 650 MG CR tablet Take 1,300 mg by mouth every 8 (eight) hours.    Marland Kitchen atorvastatin (LIPITOR) 10 MG tablet TAKE 1 TABLET BY MOUTH DAILY 90 tablet 0  . azithromycin (ZITHROMAX) 250 MG tablet Take 250 mg by mouth as directed.    . diphenhydramine-acetaminophen (TYLENOL PM) 25-500 MG TABS Take 1 tablet by mouth at bedtime as needed (sleep).     . fexofenadine (ALLEGRA) 60 MG tablet Take 180 mg by mouth daily.    . fluticasone (FLONASE) 50 MCG/ACT nasal spray Place 2 sprays into both nostrils daily as needed for allergies.     . folic acid (FOLVITE) 1 MG tablet Take 1 mg by mouth daily.    . hyoscyamine (ANASPAZ) 0.125 MG TBDP Place 0.125 mg under the tongue every 6 (six) hours as needed for cramping.    . methotrexate (RHEUMATREX) 2.5 MG tablet Take 20 mg by mouth once a week.    Marland Kitchen  Multiple Vitamins-Minerals (MULTIVITAMIN ADULT PO) Take 1 tablet by mouth daily.     Marland Kitchen omeprazole (PRILOSEC) 20 MG capsule Take 20 mg by mouth 2 (two) times daily.    . Probiotic Product (PROBIOTIC-10 PO) Take 1 capsule by mouth daily.     . sarilumab (KEVZARA) 200 MG/1. SOSY Inject into the skin.    Marland Kitchen zoledronic acid (RECLAST) 5 MG/100ML SOLN injection Inject 5 mg into the vein once.    . doxycycline (VIBRA-TABS) 100 MG tablet Take 1 tablet (100 mg total) by mouth 2 (two) times daily. 20 tablet 0  . KEVZARA 200 MG/1. SOSY SMARTSIG:SUB-Q    . predniSONE (DELTASONE) 2.5 MG tablet Take by mouth.     No facility-administered  medications prior to visit.     Per HPI unless specifically indicated in ROS section below  BP 100/68 (BP Location: Left Arm, Patient Position: Sitting, Cuff Size: Normal)   Pulse 67   Temp 97.8 F (36.6 C) (Temporal)   Ht 5\' 2"  (1.575 m)   Wt 144 lb 6 oz (65.5 kg)   SpO2 94%   BMI 26.41 kg/m   Wt Readings from Last 3 Encounters:  08/29/22 144 lb 6 oz (65.5 kg)  08/19/21 139 lb 2 oz (63.1 kg)  08/16/21 143 lb 8 oz (65.1 kg)      Physical Exam Vitals and nursing note reviewed.  Constitutional:      General: She is not in acute distress.    Appearance: Normal appearance. She is well-developed. She is not ill-appearing or toxic-appearing.  HENT:     Head: Normocephalic.     Right Ear: Hearing, tympanic membrane, ear canal and external ear normal.     Left Ear: Hearing, tympanic membrane, ear canal and external ear normal.     Nose: Nose normal.  Eyes:     General: Lids are normal. Lids are everted, no foreign bodies appreciated.     Conjunctiva/sclera: Conjunctivae normal.     Pupils: Pupils are equal, round, and reactive to light.  Neck:     Thyroid: No thyroid mass or thyromegaly.     Vascular: No carotid bruit.     Trachea: Trachea normal.  Cardiovascular:     Rate and Rhythm: Normal rate and regular rhythm.     Heart sounds: Normal heart sounds, S1 normal and S2 normal. No murmur heard.    No gallop.  Pulmonary:     Effort: Pulmonary effort is normal. No respiratory distress.     Breath sounds: Normal breath sounds. No wheezing, rhonchi or rales.  Abdominal:     General: Bowel sounds are normal. There is no distension or abdominal bruit.     Palpations: Abdomen is soft. There is no fluid wave or mass.     Tenderness: There is no abdominal tenderness. There is no guarding or rebound.     Hernia: No hernia is present.  Musculoskeletal:     Cervical back: Normal range of motion and neck supple.  Lymphadenopathy:     Cervical: No cervical adenopathy.  Skin:     General: Skin is warm and dry.     Findings: No rash.  Neurological:     Mental Status: She is alert.     Cranial Nerves: No cranial nerve deficit.     Sensory: No sensory deficit.  Psychiatric:        Mood and Affect: Mood is not anxious or depressed.        Speech: Speech normal.  Behavior: Behavior normal. Behavior is cooperative.        Judgment: Judgment normal.      Results for orders placed or performed in visit on 08/16/22  HM DIABETES EYE EXAM  Result Value Ref Range   HM Diabetic Eye Exam No Retinopathy No Retinopathy     COVID 19 screen:  No recent travel or known exposure to COVID19 The patient denies respiratory symptoms of COVID 19 at this time. The importance of social distancing was discussed today.   Assessment and Plan   The patient's preventative maintenance and recommended screening tests for an annual wellness exam were reviewed in full today. Brought up to date unless services declined.  Counselled on the importance of diet, exercise, and its role in overall health and mortality. The patient's FH and SH was reviewed, including their home life, tobacco status, and drug and alcohol status.   Mammo/breast exam: personal history of breast cancer: 11/2021 Vaccines: Uptodate with PNA, flu, shingles, TDap, prevnar, COVID19 x 4 DVE/pap:partial hysterectomy, one ovary remains. DVE not indicated, no pap indicated.   Colon: 09/2014  bx microscopic colitis, repeat in 10 years Dr. Lucretia Roers at [facility]. DXA:  11/28/2021 Dx with osteoporosis .Marland Kitchen Plan Reclast later this year. Former smoker minimal use. Hep C negative Problem List Items Addressed This Visit   None     Kerby Nora, MD

## 2022-08-30 ENCOUNTER — Ambulatory Visit (INDEPENDENT_AMBULATORY_CARE_PROVIDER_SITE_OTHER): Payer: Medicare Other

## 2022-08-30 VITALS — Ht 62.0 in | Wt 144.0 lb

## 2022-08-30 DIAGNOSIS — Z0000 Encounter for general adult medical examination without abnormal findings: Secondary | ICD-10-CM | POA: Diagnosis not present

## 2022-08-30 NOTE — Progress Notes (Cosign Needed Addendum)
Subjective:   Sydney Ortiz is a 77 y.o. female who presents for Medicare Annual (Subsequent) preventive examination.  Visit Complete: Virtual  I connected with  Sydney Ortiz on 08/30/22 by a audio enabled telemedicine application and verified that I am speaking with the correct person using two identifiers.  Patient Location: Home  Provider Location: Office/Clinic  I discussed the limitations of evaluation and management by telemedicine. The patient expressed understanding and agreed to proceed.   Review of Systems      Cardiac Risk Factors include: advanced age (>52men, >69 women);dyslipidemia     Objective:    Today's Vitals   08/30/22 1424  Weight: 144 lb (65.3 kg)  Height: 5\' 2"  (1.575 m)   Body mass index is 26.34 kg/m.     08/30/2022    2:30 PM 07/28/2021   10:21 AM 07/27/2020   10:40 AM 02/12/2020   11:58 AM 02/10/2020   12:09 PM 07/25/2019    9:52 AM 07/23/2018    8:21 AM  Advanced Directives  Does Patient Have a Medical Advance Directive? Yes No Yes Yes Yes Yes Yes  Type of Estate agent of [place];Living will  Healthcare Power of [place];Living will Healthcare Power of [place];Living will Healthcare Power of [place];Living will Healthcare Power of [place];Living will Healthcare Power of [place];Living will  Does patient want to make changes to medical advance directive?    No - Patient declined No - Patient declined  No - Patient declined  Copy of Healthcare Power of Attorney in Chart? No - copy requested  No - copy requested Yes - validated most recent copy scanned in chart (See row information) Yes - validated most recent copy scanned in chart (See row information) Yes - validated most recent copy scanned in chart (See row information) Yes - validated most recent copy scanned in chart (See row information)  Would patient like information on creating a medical advance directive?  No - Patient declined         Current Medications  (verified) Outpatient Encounter Medications as of 08/30/2022  Medication Sig   acetaminophen (TYLENOL) 650 MG CR tablet Take 1,300 mg by mouth every 8 (eight) hours.   atorvastatin (LIPITOR) 10 MG tablet TAKE 1 TABLET BY MOUTH DAILY   diphenhydramine-acetaminophen (TYLENOL PM) 25-500 MG TABS Take 1 tablet by mouth at bedtime as needed (sleep).    fexofenadine (ALLEGRA) 60 MG tablet Take 180 mg by mouth daily.   fluticasone (FLONASE) 50 MCG/ACT nasal spray Place 2 sprays into both nostrils daily as needed for allergies.    folic acid (FOLVITE) 1 MG tablet Take 1 mg by mouth daily.   methotrexate (RHEUMATREX) 2.5 MG tablet Take 20 mg by mouth once a week.   Multiple Vitamins-Minerals (MULTIVITAMIN ADULT PO) Take 1 tablet by mouth daily.    omeprazole (PRILOSEC) 20 MG capsule Take 20 mg by mouth 2 (two) times daily.   Probiotic Product (PROBIOTIC-10 PO) Take 1 capsule by mouth daily.    sarilumab (KEVZARA) 200 MG/1. SOSY Inject into the skin.   zoledronic acid (RECLAST) 5 MG/100ML SOLN injection Inject 5 mg into the vein once.   azithromycin (ZITHROMAX) 250 MG tablet Take 250 mg by mouth as directed. (Patient not taking: Reported on 08/30/2022)   hyoscyamine (ANASPAZ) 0.125 MG TBDP Place 0.125 mg under the tongue every 6 (six) hours as needed for cramping. (Patient not taking: Reported on 08/30/2022)   No facility-administered encounter medications on file as of 08/30/2022.    Allergies (  verified) Patient has no known allergies.   History: Past Medical History:  Diagnosis Date   AR (allergic rhinitis)    Asthma    Barrett esophagus    Breast cancer (HCC)    hx; s/p masectomy 1994   Carpal tunnel syndrome    Clostridium difficile infection 2015   Enlarged liver    Esophageal dysmotility    severe; has been elevated by rheumatology at [facility] but no connective tissue disease diagnosis was made. was ANA positive but anti-SCL 7- negative . 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    Family history of adverse reaction to anesthesia    grandmother had problems but unsure what problem was   GERD (gastroesophageal reflux disease)    24 hr pH study did not show significant acid refulux   HLD (hyperlipidemia)    elevated triglycerides   Impaired fasting glucose    Migraine headache    Osteoarthritis    Paroxysmal SVT (supraventricular tachycardia)    hx; 5 beat run noted on holter monitor in 2005. EF 65%   PKD (polycystic kidney disease)    PONV (postoperative nausea and vomiting)    Pre-diabetes    Past Surgical History:  Procedure Laterality Date   ABDOMINAL HYSTERECTOMY     abt CT test  6/07   bilateral cysts, larget on R 9.6 x 6.7 cm   APPENDECTOMY     BREAST RECONSTRUCTION  9/11   CATARACT EXTRACTION, BILATERAL Bilateral 2012, 2014   COLONOSCOPY  2015, 2016   colonoscopy/EGD  2005   EYE SURGERY     holter monitor  1/05   PVC, PACs, SVT   kidney surgery - cyst removal  3/06   KNEE ARTHROSCOPY WITH MEDIAL MENISECTOMY Right 02/12/2020   Procedure: Right knee partial medial menisectomy;  Surgeon: Kennedy Bucker, MD;  Location: [facility] ORS;  Service: Orthopedics;  Laterality: Right;   MASTECTOMY Right    PARTIAL HYSTERECTOMY  1983   due to fibroids   radial masectomy  1993   R side   right carpal tunnel  7/11   rotator cuff, right  2004   TONSILLECTOMY     UPPER GI ENDOSCOPY  2010, 09/2016   WRIST SURGERY Left 2017, 2018   fracture repair 2017; plates removed 8295   Family History  Problem Relation Age of Onset   Arthritis Father    Prostate cancer Father    Hypertension Father    Polycystic kidney disease Mother    CVA Mother    Dementia Mother    Heart Problems Mother        valve disease   Diabetes Other        DM - MU and MA    Heart attack Brother    Hypertension Brother    Ulcers Brother    Social History   Socioeconomic History   Marital status: Widowed    Spouse name: Not on file   Number of children: Not on file   Years of  education: Not on file   Highest education level: Not on file  Occupational History   Not on file  Tobacco Use   Smoking status: Former    Packs/day: 0.25    Years: 10.00    Additional pack years: 0.00    Total pack years: 2.50    Types: Cigarettes    Quit date: 74    Years since quitting: 38.5   Smokeless tobacco: Never  Vaping Use   Vaping Use: Never used  Substance and Sexual Activity  Alcohol use: Yes    Comment: wine 3 times a week    Drug use: No   Sexual activity: Yes  Other Topics Concern   Not on file  Social History Narrative   Married, 2 adopted children (1B, 1G)   Originally from [place]; moved from [place] to [place].    Regular exercise - treadmill, 2 miles 3x/week; 4 miles daily    Retired - [employer]   Diet: avoids MSG (triggers HA), cultured cheese, coloring.       Pt signed designated party release form and gives Lynasia Meloche, spouse [phone] and, and son Schuyler Behan, [phone], access to medical records. Can leave msg on answering machine [phone] and on cell [phone].      Has living will, HCPOA husband, full code ( reviewed 2014)   Social Determinants of Health   Financial Resource Strain: Low Risk  (08/30/2022)   Overall Financial Resource Strain (CARDIA)    Difficulty of Paying Living Expenses: Not hard at all  Food Insecurity: No Food Insecurity (08/30/2022)   Hunger Vital Sign    Worried About Running Out of Food in the Last Year: Never true    Ran Out of Food in the Last Year: Never true  Transportation Needs: No Transportation Needs (07/28/2021)   PRAPARE - Administrator, Civil Service (Medical): No    Lack of Transportation (Non-Medical): No  Physical Activity: Sufficiently Active (08/30/2022)   Exercise Vital Sign    Days of Exercise per Week: 7 days    Minutes of Exercise per Session: 60 min  Stress: No Stress Concern Present (08/30/2022)   [employer] of Occupational Health - Occupational Stress Questionnaire    Feeling of  Stress : Not at all  Social Connections: Moderately Isolated (08/30/2022)   Social Connection and Isolation Panel [NHANES]    Frequency of Communication with Friends and Family: More than three times a week    Frequency of Social Gatherings with Friends and Family: More than three times a week    Attends Religious Services: More than 4 times per year    Active Member of [employer] or Organizations: No    Attends Banker Meetings: Never    Marital Status: Widowed    Tobacco Counseling Counseling given: Not Answered   Clinical Intake:  Pre-visit preparation completed: Yes  Pain : No/denies pain     Nutritional Risks: None Diabetes: No  How often do you need to have someone help you when you read instructions, pamphlets, or other written materials from your doctor or pharmacy?: 1 - Never  Interpreter Needed?: No  Information entered by :: C.Ahlaya Ende LPN   Activities of Daily Living    08/30/2022    2:32 PM 08/29/2022    3:08 PM  In your present state of health, do you have any difficulty performing the following activities:  Hearing? 0 0  Vision? 0 0  Difficulty concentrating or making decisions? 0 0  Walking or climbing stairs? 0 0  Dressing or bathing? 0 0  Doing errands, shopping? 0 0  Preparing Food and eating ? N N  Using the Toilet? N N  In the past six months, have you accidently leaked urine? N N  Do you have problems with loss of bowel control? N N  Managing your Medications? N N  Managing your Finances? N N  Housekeeping or managing your Housekeeping? N N    Patient Care Team: Excell Seltzer, MD as PCP - General  Blair Promise, OD as Consulting Physician (Optometry) Lucretia Roers, Richard Chalmers Cater., MD as Referring Physician (Gastroenterology) Terrial Rhodes, MD as Consulting Physician (Nephrology)  Indicate any recent Medical Services you may have received from other than [facility] providers in the past year (date may be approximate).      Assessment:   This is a routine wellness examination for Eunie.  Hearing/Vision screen Hearing Screening - Comments:: Wears aids Vision Screening - Comments:: Glasses - [facility] - UTD on eye exams.  Dietary issues and exercise activities discussed:     Goals Addressed             This Visit's Progress    DIET - EAT MORE FRUITS AND VEGETABLES   On track      Depression Screen    08/30/2022    2:30 PM 08/29/2022    4:27 PM 07/28/2021   10:19 AM 07/27/2020   10:41 AM 07/25/2019    9:54 AM 07/23/2018    8:07 AM 07/10/2017    8:22 AM  PHQ 2/9 Scores  PHQ - 2 Score 0 0 0 0 0 0 0  PHQ- 9 Score    0 0 0 0    Fall Risk    08/30/2022    2:31 PM 08/29/2022    4:26 PM 08/29/2022    3:08 PM 07/28/2021   10:22 AM 07/27/2020   10:40 AM  Fall Risk   Falls in the past year? 0 0 0 0 0  Number falls in past yr: 0 0  0 0  Injury with Fall? 0 0  0 0  Risk for fall due to : No Fall Risks No Fall Risks  No Fall Risks Medication side effect  Follow up Falls prevention discussed;Falls evaluation completed Falls evaluation completed  Falls evaluation completed Falls evaluation completed;Falls prevention discussed    MEDICARE RISK AT HOME:  Medicare Risk at Home - 08/30/22 1432     Any stairs in or around the home? Yes    If so, are there any without handrails? No    Home free of loose throw rugs in walkways, pet beds, electrical cords, etc? Yes    Adequate lighting in your home to reduce risk of falls? Yes    Life alert? No    Use of a cane, walker or w/c? No    Grab bars in the bathroom? Yes    Shower chair or bench in shower? No    Elevated toilet seat or a handicapped toilet? Yes             TIMED UP AND GO:  Was the test performed?  No    Cognitive Function:    07/27/2020   10:44 AM 07/25/2019    9:57 AM 07/23/2018    8:07 AM 07/10/2017    8:26 AM 05/23/2016   10:23 AM  MMSE - Mini Mental State Exam  Not completed:  Refused     Orientation to time 5  5 5 5    Orientation to Place 5  5 5 5   Registration 3  3 3 3   Attention/ Calculation 5  0 0 0  Recall 3  3 3 2   Recall-comments     pt was unable to recall 1 of 3 words  Language- name 2 objects   0 0 0  Language- repeat 1  1 1 1   Language- follow 3 step command   0 3 3  Language- read & follow direction   0 0  0  Write a sentence   0 0 0  Copy design   0 0 0  Total score   17 20 19         08/30/2022    2:34 PM  6CIT Screen  What Year? 0 points  What month? 0 points  What time? 0 points  Count back from 20 0 points  Months in reverse 0 points  Repeat phrase 0 points  Total Score 0 points    Immunizations Immunization History  Administered Date(s) Administered   Fluad Quad(high Dose 65+) 11/12/2018, 12/13/2019   Influenza Split 11/21/2010, 12/13/2011   Influenza Whole 01/04/2007, 12/02/2007, 12/02/2008, 12/29/2009   Influenza, High Dose Seasonal PF 12/14/2015, 12/04/2016, 12/31/2020, 11/30/2021   Influenza,inj,Quad PF,6+ Mos 12/11/2012, 01/21/2014, 12/19/2017   Influenza-Unspecified 01/01/2015   Moderna Covid-19 Vaccine Bivalent Booster 91yrs & up 01/14/2021   Moderna SARS-COV2 Booster Vaccination 06/14/2020   Moderna Sars-Covid-2 Vaccination 03/20/2019, 04/17/2019, 01/16/2020   Pneumococcal Conjugate-13 02/09/2016   Pneumococcal Polysaccharide-23 06/04/2005, 12/11/2012   Td 01/05/2004   Tdap 08/04/2012   Zoster Recombinat (Shingrix) 01/07/2018, 06/15/2019   Zoster, Live 07/16/2009    TDAP status: Due, Education has been provided regarding the importance of this vaccine. Advised may receive this vaccine at local pharmacy or Health Dept. Aware to provide a copy of the vaccination record if obtained from local pharmacy or Health Dept. Verbalized acceptance and understanding.  Flu Vaccine status: Up to date  Pneumococcal vaccine status: Up to date  Covid-19 vaccine status: Information provided on how to obtain vaccines.   Qualifies for Shingles Vaccine? Yes   Zostavax  completed Yes   Shingrix Completed?: Yes  Screening Tests Health Maintenance  Topic Date Due   COVID-19 Vaccine (5 - 2023-24 season) 11/04/2021   DTaP/Tdap/Td (3 - Td or Tdap) 08/05/2022   INFLUENZA VACCINE  10/05/2022   MAMMOGRAM  12/03/2022   Medicare Annual Wellness (AWV)  08/30/2023   DEXA SCAN  11/29/2026   Pneumonia Vaccine 16+ Years old  Completed   Hepatitis C Screening  Completed   Zoster Vaccines- Shingrix  Completed   HPV VACCINES  Aged Out   Colonoscopy  Discontinued    Health Maintenance  Health Maintenance Due  Topic Date Due   COVID-19 Vaccine (5 - 2023-24 season) 11/04/2021   DTaP/Tdap/Td (3 - Td or Tdap) 08/05/2022    Colorectal cancer screening: No longer required.   Mammogram status: Completed 12/02/21. Repeat every year  Bone Density status: Completed 11/28/21. Results reflect: Bone density results: NORMAL. Repeat every 5 years.  Lung Cancer Screening: (Low Dose CT Chest recommended if Age 55-80 years, 20 pack-year currently smoking OR have quit w/in 15years.) does not qualify.   Lung Cancer Screening Referral: no  Additional Screening:  Hepatitis C Screening: does qualify; Completed 08/16/10  Vision Screening: Recommended annual ophthalmology exams for early detection of glaucoma and other disorders of the eye. Is the patient up to date with their annual eye exam?  Yes  Who is the provider or what is the name of the office in which the patient attends annual eye exams? [facility] If pt is not established with a provider, would they like to be referred to a provider to establish care? Yes .   Dental Screening: Recommended annual dental exams for proper oral hygiene  Community Resource Referral / Chronic Care Management: CRR required this visit?  No   CCM required this visit?  No     Plan:     I have personally  reviewed and noted the following in the patient's chart:   Medical and social history Use of alcohol, tobacco or illicit  drugs  Current medications and supplements including opioid prescriptions. Patient is not currently taking opioid prescriptions. Functional ability and status Nutritional status Physical activity Advanced directives List of other physicians Hospitalizations, surgeries, and ER visits in previous 12 months Vitals Screenings to include cognitive, depression, and falls Referrals and appointments  In addition, I have reviewed and discussed with patient certain preventive protocols, quality metrics, and best practice recommendations. A written personalized care plan for preventive services as well as general preventive health recommendations were provided to patient.     Maryan Puls, LPN   06/12/8117   After Visit Summary: (MyChart) Due to this being a telephonic visit, the after visit summary with patients personalized plan was offered to patient via MyChart   Nurse Notes:  Late calling pt due to unavoidable circumstances. In-person pt prior took longer than expected. I apologized to pt and completed AWV.

## 2022-08-30 NOTE — Patient Instructions (Signed)
Ms. Sydney Ortiz , Thank you for taking time to come for your Medicare Wellness Visit. I appreciate your ongoing commitment to your health goals. Please review the following plan we discussed and let me know if I can assist you in the future.   These are the goals we discussed:  Goals      DIET - EAT MORE FRUITS AND VEGETABLES     Patient Stated     Starting 07/23/18, I will continue to walk at least 7000 steps daily.      Patient Stated     07/25/2019, I will continue to walk 7,000 steps everyday.      Patient Stated     07/27/2020, I will continue to do water aerobics 2 days a week for 1 hour.         This is a list of the screening recommended for you and due dates:  Health Maintenance  Topic Date Due   COVID-19 Vaccine (5 - 2023-24 season) 11/04/2021   DTaP/Tdap/Td vaccine (3 - Td or Tdap) 08/05/2022   Flu Shot  10/05/2022   Mammogram  12/03/2022   Medicare Annual Wellness Visit  08/30/2023   DEXA scan (bone density measurement)  11/29/2026   Pneumonia Vaccine  Completed   Hepatitis C Screening  Completed   Zoster (Shingles) Vaccine  Completed   HPV Vaccine  Aged Out   Colon Cancer Screening  Discontinued    Advanced directives: Please bring a copy of your health care power of attorney and living will to the office to be added to your chart at your convenience.   Conditions/risks identified: Aim for 30 minutes of exercise or brisk walking, 6-8 glasses of water, and 5 servings of fruits and vegetables each day.   Next appointment: Follow up in one year for your annual wellness visit 09/03/23 @ 2pm televisit   Preventive Care 65 Years and Older, Female Preventive care refers to lifestyle choices and visits with your health care provider that can promote health and wellness. What does preventive care include? A yearly physical exam. This is also called an annual well check. Dental exams once or twice a year. Routine eye exams. Ask your health care provider how often you should  have your eyes checked. Personal lifestyle choices, including: Daily care of your teeth and gums. Regular physical activity. Eating a healthy diet. Avoiding tobacco and drug use. Limiting alcohol use. Practicing safe sex. Taking low-dose aspirin every day. Taking vitamin and mineral supplements as recommended by your health care provider. What happens during an annual well check? The services and screenings done by your health care provider during your annual well check will depend on your age, overall health, lifestyle risk factors, and family history of disease. Counseling  Your health care provider may ask you questions about your: Alcohol use. Tobacco use. Drug use. Emotional well-being. Home and relationship well-being. Sexual activity. Eating habits. History of falls. Memory and ability to understand (cognition). Work and work Astronomer. Reproductive health. Screening  You may have the following tests or measurements: Height, weight, and BMI. Blood pressure. Lipid and cholesterol levels. These may be checked every 5 years, or more frequently if you are over 1 years old. Skin check. Lung cancer screening. You may have this screening every year starting at age 59 if you have a 30-pack-year history of smoking and currently smoke or have quit within the past 15 years. Fecal occult blood test (FOBT) of the stool. You may have this test every year starting  at age 90. Flexible sigmoidoscopy or colonoscopy. You may have a sigmoidoscopy every 5 years or a colonoscopy every 10 years starting at age 51. Hepatitis C blood test. Hepatitis B blood test. Sexually transmitted disease (STD) testing. Diabetes screening. This is done by checking your blood sugar (glucose) after you have not eaten for a while (fasting). You may have this done every 1-3 years. Bone density scan. This is done to screen for osteoporosis. You may have this done starting at age 27. Mammogram. This may be done  every 1-2 years. Talk to your health care provider about how often you should have regular mammograms. Talk with your health care provider about your test results, treatment options, and if necessary, the need for more tests. Vaccines  Your health care provider may recommend certain vaccines, such as: Influenza vaccine. This is recommended every year. Tetanus, diphtheria, and acellular pertussis (Tdap, Td) vaccine. You may need a Td booster every 10 years. Zoster vaccine. You may need this after age 20. Pneumococcal 13-valent conjugate (PCV13) vaccine. One dose is recommended after age 45. Pneumococcal polysaccharide (PPSV23) vaccine. One dose is recommended after age 30. Talk to your health care provider about which screenings and vaccines you need and how often you need them. This information is not intended to replace advice given to you by your health care provider. Make sure you discuss any questions you have with your health care provider. Document Released: 03/19/2015 Document Revised: 11/10/2015 Document Reviewed: 12/22/2014 Elsevier Interactive Patient Education  2017 [place] Prevention in the Home Falls can cause injuries. They can happen to people of all ages. There are many things you can do to make your home safe and to help prevent falls. What can I do on the outside of my home? Regularly fix the edges of walkways and driveways and fix any cracks. Remove anything that might make you trip as you walk through a door, such as a raised step or threshold. Trim any bushes or trees on the path to your home. Use bright outdoor lighting. Clear any walking paths of anything that might make someone trip, such as rocks or tools. Regularly check to see if handrails are loose or broken. Make sure that both sides of any steps have handrails. Any raised decks and porches should have guardrails on the edges. Have any leaves, snow, or ice cleared regularly. Use sand or salt on  walking paths during winter. Clean up any spills in your garage right away. This includes oil or grease spills. What can I do in the bathroom? Use night lights. Install grab bars by the toilet and in the tub and shower. Do not use towel bars as grab bars. Use non-skid mats or decals in the tub or shower. If you need to sit down in the shower, use a plastic, non-slip stool. Keep the floor dry. Clean up any water that spills on the floor as soon as it happens. Remove soap buildup in the tub or shower regularly. Attach bath mats securely with double-sided non-slip rug tape. Do not have throw rugs and other things on the floor that can make you trip. What can I do in the bedroom? Use night lights. Make sure that you have a light by your bed that is easy to reach. Do not use any sheets or blankets that are too big for your bed. They should not hang down onto the floor. Have a firm chair that has side arms. You can use this for support  while you get dressed. Do not have throw rugs and other things on the floor that can make you trip. What can I do in the kitchen? Clean up any spills right away. Avoid walking on wet floors. Keep items that you use a lot in easy-to-reach places. If you need to reach something above you, use a strong step stool that has a grab bar. Keep electrical cords out of the way. Do not use floor polish or wax that makes floors slippery. If you must use wax, use non-skid floor wax. Do not have throw rugs and other things on the floor that can make you trip. What can I do with my stairs? Do not leave any items on the stairs. Make sure that there are handrails on both sides of the stairs and use them. Fix handrails that are broken or loose. Make sure that handrails are as long as the stairways. Check any carpeting to make sure that it is firmly attached to the stairs. Fix any carpet that is loose or worn. Avoid having throw rugs at the top or bottom of the stairs. If you do  have throw rugs, attach them to the floor with carpet tape. Make sure that you have a light switch at the top of the stairs and the bottom of the stairs. If you do not have them, ask someone to add them for you. What else can I do to help prevent falls? Wear shoes that: Do not have high heels. Have rubber bottoms. Are comfortable and fit you well. Are closed at the toe. Do not wear sandals. If you use a stepladder: Make sure that it is fully opened. Do not climb a closed stepladder. Make sure that both sides of the stepladder are locked into place. Ask someone to hold it for you, if possible. Clearly mark and make sure that you can see: Any grab bars or handrails. First and last steps. Where the edge of each step is. Use tools that help you move around (mobility aids) if they are needed. These include: Canes. Walkers. Scooters. Crutches. Turn on the lights when you go into a dark area. Replace any light bulbs as soon as they burn out. Set up your furniture so you have a clear path. Avoid moving your furniture around. If any of your floors are uneven, fix them. If there are any pets around you, be aware of where they are. Review your medicines with your doctor. Some medicines can make you feel dizzy. This can increase your chance of falling. Ask your doctor what other things that you can do to help prevent falls. This information is not intended to replace advice given to you by your health care provider. Make sure you discuss any questions you have with your health care provider. Document Released: 12/17/2008 Document Revised: 07/29/2015 Document Reviewed: 03/27/2014 Elsevier Interactive Patient Education  2017 [employer].

## 2022-10-05 ENCOUNTER — Other Ambulatory Visit: Payer: Self-pay | Admitting: Nephrology

## 2022-10-05 DIAGNOSIS — N181 Chronic kidney disease, stage 1: Secondary | ICD-10-CM

## 2022-10-06 LAB — LAB REPORT - SCANNED
Creatinine, POC: 57.3 mg/dL
EGFR: 90

## 2022-11-21 ENCOUNTER — Other Ambulatory Visit: Payer: Medicare Other

## 2022-11-22 ENCOUNTER — Other Ambulatory Visit: Payer: Self-pay | Admitting: Family Medicine

## 2022-11-22 DIAGNOSIS — Z1231 Encounter for screening mammogram for malignant neoplasm of breast: Secondary | ICD-10-CM

## 2022-11-23 ENCOUNTER — Ambulatory Visit
Admission: RE | Admit: 2022-11-23 | Discharge: 2022-11-23 | Disposition: A | Discharge status: Home / Self Care | Payer: Medicare Other | Source: Ambulatory Visit | Attending: Nephrology | Admitting: Nephrology

## 2022-11-23 DIAGNOSIS — N181 Chronic kidney disease, stage 1: Secondary | ICD-10-CM

## 2022-12-22 ENCOUNTER — Ambulatory Visit
Admission: RE | Admit: 2022-12-22 | Discharge: 2022-12-22 | Disposition: A | Discharge status: Home / Self Care | Payer: Medicare Other | Source: Ambulatory Visit | Attending: Family Medicine | Admitting: Family Medicine

## 2022-12-22 DIAGNOSIS — Z1231 Encounter for screening mammogram for malignant neoplasm of breast: Secondary | ICD-10-CM

## 2023-01-18 ENCOUNTER — Other Ambulatory Visit: Payer: Self-pay | Admitting: Family Medicine

## 2023-02-15 ENCOUNTER — Encounter: Payer: Self-pay | Admitting: Family Medicine

## 2023-03-30 LAB — LAB REPORT - SCANNED
Creatinine, POC: 92.9 mg/dL
EGFR: 90

## 2023-05-10 ENCOUNTER — Other Ambulatory Visit: Payer: Self-pay | Admitting: Family Medicine

## 2023-08-16 ENCOUNTER — Other Ambulatory Visit: Payer: Self-pay | Admitting: Family Medicine

## 2023-08-16 NOTE — Telephone Encounter (Signed)
 Called  pt and schedule a appt for cpe / labs

## 2023-08-16 NOTE — Telephone Encounter (Signed)
 Please schedule CPE with fasting labs prior for Dr. Cherlyn Cornet after 09/03/2023.

## 2023-08-27 ENCOUNTER — Telehealth: Payer: Self-pay | Admitting: *Deleted

## 2023-08-27 DIAGNOSIS — E559 Vitamin D deficiency, unspecified: Secondary | ICD-10-CM

## 2023-08-27 DIAGNOSIS — E782 Mixed hyperlipidemia: Secondary | ICD-10-CM

## 2023-08-27 NOTE — Telephone Encounter (Signed)
-----   Message from Veva JINNY Ferrari sent at 08/20/2023  3:05 PM EDT ----- Regarding: Lab orders for Wed, 6.25.25 Patient is scheduled for CPX labs, please order future labs, Thanks , Veva

## 2023-08-29 ENCOUNTER — Ambulatory Visit: Payer: Self-pay | Admitting: Family Medicine

## 2023-08-29 ENCOUNTER — Other Ambulatory Visit (INDEPENDENT_AMBULATORY_CARE_PROVIDER_SITE_OTHER)

## 2023-08-29 DIAGNOSIS — E559 Vitamin D deficiency, unspecified: Secondary | ICD-10-CM | POA: Diagnosis not present

## 2023-08-29 DIAGNOSIS — E782 Mixed hyperlipidemia: Secondary | ICD-10-CM

## 2023-08-29 LAB — COMPREHENSIVE METABOLIC PANEL WITH GFR
ALT: 20 U/L (ref 0–35)
AST: 21 U/L (ref 0–37)
Albumin: 4.5 g/dL (ref 3.5–5.2)
Alkaline Phosphatase: 35 U/L — ABNORMAL LOW (ref 39–117)
BUN: 14 mg/dL (ref 6–23)
CO2: 30 meq/L (ref 19–32)
Calcium: 9.5 mg/dL (ref 8.4–10.5)
Chloride: 105 meq/L (ref 96–112)
Creatinine, Ser: 0.76 mg/dL (ref 0.40–1.20)
GFR: 75.13 mL/min (ref >60.00)
Glucose, Bld: 99 mg/dL (ref 70–99)
Potassium: 4.2 meq/L (ref 3.5–5.1)
Sodium: 142 meq/L (ref 135–145)
Total Bilirubin: 0.7 mg/dL (ref 0.2–1.2)
Total Protein: 6.6 g/dL (ref 6.0–8.3)

## 2023-08-29 LAB — LIPID PANEL
Cholesterol: 184 mg/dL (ref 0–200)
HDL: 54.6 mg/dL (ref >39.00)
LDL Cholesterol: 91 mg/dL (ref 0–99)
NonHDL: 129.05
Total CHOL/HDL Ratio: 3
Triglycerides: 188 mg/dL — ABNORMAL HIGH (ref 0.0–149.0)
VLDL: 37.6 mg/dL (ref 0.0–40.0)

## 2023-08-29 LAB — VITAMIN D 25 HYDROXY (VIT D DEFICIENCY, FRACTURES): VITD: 54.68 ng/mL (ref 30.00–100.00)

## 2023-08-29 NOTE — Progress Notes (Signed)
 No critical labs need to be addressed urgently. We will discuss labs in detail at upcoming office visit.

## 2023-09-03 ENCOUNTER — Ambulatory Visit (INDEPENDENT_AMBULATORY_CARE_PROVIDER_SITE_OTHER): Payer: Medicare Other

## 2023-09-03 VITALS — BP 100/68 | Ht 62.0 in | Wt 139.0 lb

## 2023-09-03 DIAGNOSIS — Z0000 Encounter for general adult medical examination without abnormal findings: Secondary | ICD-10-CM

## 2023-09-03 NOTE — Patient Instructions (Signed)
 Sydney Ortiz , Thank you for taking time out of your busy schedule to complete your Annual Wellness Visit with me. I enjoyed our conversation and look forward to speaking with you again next year. I, as well as your care team,  appreciate your ongoing commitment to your health goals. Please review the following plan we discussed and let me know if I can assist you in the future. Your Game plan/ To Do List    Referrals: If you haven't heard from the office you've been referred to, please reach out to them at the phone provided.  none Follow up Visits: Next Medicare AWV with our clinical staff: 09/03/2024 Have you seen your provider in the last 6 months (3 months if uncontrolled diabetes)? No Next Office Visit with your provider: 09/05/2023  Clinician Recommendations:  Aim for 30 minutes of exercise or brisk walking, 6-8 glasses of water, and 5 servings of fruits and vegetables each day.       This is a list of the screening recommended for you and due dates:  Health Maintenance  Topic Date Due   DTaP/Tdap/Td vaccine (3 - Td or Tdap) 08/05/2022   COVID-19 Vaccine (6 - Moderna risk 2024-25 season) 05/31/2023   Flu Shot  10/05/2023   Mammogram  12/22/2023   Medicare Annual Wellness Visit  09/02/2024   DEXA scan (bone density measurement)  11/29/2026   Pneumococcal Vaccine for age over 12  Completed   Hepatitis C Screening  Completed   Zoster (Shingles) Vaccine  Completed   Hepatitis B Vaccine  Aged Out   HPV Vaccine  Aged Out   Meningitis B Vaccine  Aged Out   Colon Cancer Screening  Discontinued    Advanced directives: (Declined) Advance directive discussed with you today. Even though you declined this today, please call our office should you change your mind, and we can give you the proper paperwork for you to fill out. Advance Care Planning is important because it:  [x]  Makes sure you receive the medical care that is consistent with your values, goals, and preferences  [x]  It  provides guidance to your family and loved ones and reduces their decisional burden about whether or not they are making the right decisions based on your wishes.  Follow the link provided in your after visit summary or read over the paperwork we have mailed to you to help you started getting your Advance Directives in place. If you need assistance in completing these, please reach out to us  so that we can help you!  See attachments for Preventive Care and Fall Prevention Tips.

## 2023-09-03 NOTE — Progress Notes (Signed)
 Because this visit was a virtual/telehealth visit,  certain criteria was not obtained, such a blood pressure, CBG if applicable, and timed get up and go. Any medications not marked as taking were not mentioned during the medication reconciliation part of the visit. Any vitals not documented were not able to be obtained due to this being a telehealth visit or patient was unable to self-report a recent blood pressure reading due to a lack of equipment at home via telehealth. Vitals that have been documented are verbally provided by the patient.   This visit was performed by a medical professional under my direct supervision. I was immediately available for consultation/collaboration. I have reviewed and agree with the Annual Wellness Visit documentation.  Subjective:   Sydney Ortiz is a 78 y.o. who presents for a Medicare Wellness preventive visit.  As a reminder, Annual Wellness Visits don't include a physical exam, and some assessments may be limited, especially if this visit is performed virtually. We may recommend an in-person follow-up visit with your provider if needed.  Visit Complete: Virtual I connected with  Sydney Ortiz on 09/03/23 by a audio enabled telemedicine application and verified that I am speaking with the correct person using two identifiers.  Patient Location: Home  Provider Location: Home Office  I discussed the limitations of evaluation and management by telemedicine. The patient expressed understanding and agreed to proceed.  Vital Signs: Because this visit was a virtual/telehealth visit, some criteria may be missing or patient reported. Any vitals not documented were not able to be obtained and vitals that have been documented are patient reported.  VideoDeclined- This patient declined Librarian, academic. Therefore the visit was completed with audio only.  Persons Participating in Visit: Patient.  AWV Questionnaire: Yes: Patient Medicare  AWV questionnaire was completed by the patient on 08/31/2023; I have confirmed that all information answered by patient is correct and no changes since this date.  Cardiac Risk Factors include: advanced age (>44men, >58 women);dyslipidemia     Objective:    Today's Vitals   09/03/23 1449  BP: 100/68  Weight: 139 lb (63 kg)  Height: 5' 2 (1.575 m)   Body mass index is 25.42 kg/m.     09/03/2023    2:48 PM 08/30/2022    2:30 PM 07/28/2021   10:21 AM 07/27/2020   10:40 AM 02/12/2020   11:58 AM 02/10/2020   12:09 PM 07/25/2019    9:52 AM  Advanced Directives  Does Patient Have a Medical Advance Directive? Yes Yes No Yes Yes Yes Yes  Type of Estate agent of [place];Living will Healthcare Power of [place];Living will  Healthcare Power of [place];Living will Healthcare Power of [place];Living will Healthcare Power of [place];Living will Healthcare Power of [place];Living will  Does patient want to make changes to medical advance directive? No - Patient declined    No - Patient declined No - Patient declined   Copy of Healthcare Power of Attorney in Chart? No - copy requested No - copy requested  No - copy requested Yes - validated most recent copy scanned in chart (See row information) Yes - validated most recent copy scanned in chart (See row information) Yes - validated most recent copy scanned in chart (See row information)  Would patient like information on creating a medical advance directive?   No - Patient declined        Current Medications (verified) Outpatient Encounter Medications as of 09/03/2023  Medication Sig   acetaminophen  (TYLENOL ) 650  MG CR tablet Take 1,300 mg by mouth every 8 (eight) hours.   atorvastatin  (LIPITOR) 10 MG tablet TAKE 1 TABLET BY MOUTH DAILY   diphenhydramine-acetaminophen  (TYLENOL  PM) 25-500 MG TABS Take 1 tablet by mouth at bedtime as needed (sleep).    fexofenadine (ALLEGRA) 60 MG tablet Take 180 mg by mouth daily.    fluticasone  (FLONASE ) 50 MCG/ACT nasal spray Place 2 sprays into both nostrils daily as needed for allergies.    folic acid (FOLVITE) 1 MG tablet Take 1 mg by mouth daily.   methotrexate (RHEUMATREX) 2.5 MG tablet Take 20 mg by mouth once a week.   Multiple Vitamins-Minerals (MULTIVITAMIN ADULT PO) Take 1 tablet by mouth daily.    omeprazole (PRILOSEC) 20 MG capsule Take 20 mg by mouth 2 (two) times daily.   Probiotic Product (PROBIOTIC-10 PO) Take 1 capsule by mouth daily.    sarilumab (KEVZARA) 200 MG/1. SOSY Inject 200 mg into the skin every 14 (fourteen) days.   zoledronic acid (RECLAST) 5 MG/100ML SOLN injection Inject 5 mg into the vein once.   No facility-administered encounter medications on file as of 09/03/2023.    Allergies (verified) Patient has no known allergies.   History: Past Medical History:  Diagnosis Date   AR (allergic rhinitis)    Asthma    Barrett esophagus    Breast cancer (HCC)    hx; s/p masectomy 1994   Carpal tunnel syndrome    Clostridium difficile infection 2015   Enlarged liver    Esophageal dysmotility    severe; has been elevated by rheumatology at [facility] but no connective tissue disease diagnosis was made. was ANA positive but anti-SCL 7- negative . hypersensitive esophageous on amitriptyline.   Family history of adverse reaction to anesthesia    grandmother had problems but unsure what problem was   GERD (gastroesophageal reflux disease)    24 hr pH study did not show significant acid refulux   HLD (hyperlipidemia)    elevated triglycerides   Impaired fasting glucose    Migraine headache    Osteoarthritis    Paroxysmal SVT (supraventricular tachycardia) (HCC)    hx; 5 beat run noted on holter monitor in 2005. EF 65%   PKD (polycystic kidney disease)    PONV (postoperative nausea and vomiting)    Pre-diabetes    Past Surgical History:  Procedure Laterality Date   ABDOMINAL HYSTERECTOMY     abt CT test  6/07   bilateral cysts, larget  on R 9.6 x 6.7 cm   APPENDECTOMY     BREAST RECONSTRUCTION  9/11   CATARACT EXTRACTION, BILATERAL Bilateral 2012, 2014   COLONOSCOPY  2015, 2016   colonoscopy/EGD  2005   EYE SURGERY     holter monitor  1/05   PVC, PACs, SVT   kidney surgery - cyst removal  3/06   KNEE ARTHROSCOPY WITH MEDIAL MENISECTOMY Right 02/12/2020   Procedure: Right knee partial medial menisectomy;  Surgeon: Kathlynn Sharper, MD;  Location: [facility] ORS;  Service: Orthopedics;  Laterality: Right;   MASTECTOMY Right    PARTIAL HYSTERECTOMY  1983   due to fibroids   radial masectomy  1993   R side   right carpal tunnel  7/11   rotator cuff, right  2004   TONSILLECTOMY     UPPER GI ENDOSCOPY  2010, 09/2016   WRIST SURGERY Left 2017, 2018   fracture repair 2017; plates removed 7981   Family History  Problem Relation Age of Onset   Arthritis Father  Prostate cancer Father    Hypertension Father    Polycystic kidney disease Mother    CVA Mother    Dementia Mother    Heart Problems Mother        valve disease   Diabetes Other        DM - MU and MA    Heart attack Brother    Hypertension Brother    Ulcers Brother    Social History   Socioeconomic History   Marital status: Widowed    Spouse name: Not on file   Number of children: Not on file   Years of education: Not on file   Highest education level: Bachelor's degree (e.g., BA, AB, BS)  Occupational History   Not on file  Tobacco Use   Smoking status: Former    Current packs/day: 0.00    Average packs/day: 0.3 packs/day for 10.0 years (2.5 ttl pk-yrs)    Types: Cigarettes    Start date: 15    Quit date: 63    Years since quitting: 39.5   Smokeless tobacco: Never  Vaping Use   Vaping status: Never Used  Substance and Sexual Activity   Alcohol use: Yes    Comment: wine 3 times a week    Drug use: No   Sexual activity: Yes  Other Topics Concern   Not on file  Social History Narrative   Married, 2 adopted children (1B, 1G)   Originally  from [place]; moved from [place] to [place].    Regular exercise - treadmill, 2 miles 3x/week; 4 miles daily    Retired - [employer]   Diet: avoids MSG (triggers HA), cultured cheese, coloring.       Pt signed designated party release form and gives Laurielle Selmon, spouse [phone] and, and son Cheyne Bungert, [phone], access to medical records. Can leave msg on answering machine [phone] and on cell [phone].      Has living will, HCPOA husband, full code ( reviewed 2014)   Social Drivers of Corporate investment banker Strain: Low Risk  (08/31/2023)   Overall Financial Resource Strain (CARDIA)    Difficulty of Paying Living Expenses: Not very hard  Food Insecurity: No Food Insecurity (08/31/2023)   Hunger Vital Sign    Worried About Running Out of Food in the Last Year: Never true    Ran Out of Food in the Last Year: Never true  Transportation Needs: No Transportation Needs (08/31/2023)   PRAPARE - Administrator, Civil Service (Medical): No    Lack of Transportation (Non-Medical): No  Physical Activity: Sufficiently Active (08/31/2023)   Exercise Vital Sign    Days of Exercise per Week: 3 days    Minutes of Exercise per Session: 60 min  Stress: Stress Concern Present (08/31/2023)   [employer] of Occupational Health - Occupational Stress Questionnaire    Feeling of Stress: To some extent  Social Connections: Moderately Integrated (08/31/2023)   Social Connection and Isolation Panel    Frequency of Communication with Friends and Family: More than three times a week    Frequency of Social Gatherings with Friends and Family: More than three times a week    Attends Religious Services: More than 4 times per year    Active Member of [employer] or Organizations: Yes    Attends Banker Meetings: More than 4 times per year    Marital Status: Widowed    Tobacco Counseling Counseling given: Not Answered    Clinical Intake:  Pre-visit preparation completed: Yes  Pain :  No/denies pain     BMI - recorded: 25.42 Nutritional Status: BMI 25 -29 Overweight Nutritional Risks: None Diabetes: No  Lab Results  Component Value Date   HGBA1C 6.2 08/16/2021   HGBA1C 6.3 (H) 05/30/2007     How often do you need to have someone help you when you read instructions, pamphlets, or other written materials from your doctor or pharmacy?: 1 - Never What is the last grade level you completed in school?: college  Interpreter Needed?: No  Information entered by :: Lyle Anderson LATHER   Activities of Daily Living     08/31/2023    4:50 PM  In your present state of health, do you have any difficulty performing the following activities:  Hearing? 0  Vision? 0  Difficulty concentrating or making decisions? 0  Walking or climbing stairs? 0  Dressing or bathing? 0  Doing errands, shopping? 0  Preparing Food and eating ? N  Using the Toilet? N  In the past six months, have you accidently leaked urine? N  Do you have problems with loss of bowel control? N  Managing your Medications? N  Managing your Finances? N  Housekeeping or managing your Housekeeping? N    Patient Care Team: Avelina Greig BRAVO, MD as PCP - General Portia Fireman, [place] as Consulting Physician (Optometry) Debarah Charlie Franky Mickey., MD as Referring Physician (Gastroenterology) Rayburn Pac, MD as Consulting Physician (Nephrology)  I have updated your Care Teams any recent Medical Services you may have received from other providers in the past year.     Assessment:   This is a routine wellness examination for Sydney Ortiz.  Hearing/Vision screen Hearing Screening - Comments:: Patient wears hearing aids  Vision Screening - Comments:: Patient has hearing aids    Goals Addressed             This Visit's Progress    Patient Stated   On track    Starting 07/23/18, I will continue to walk at least 7000 steps daily.        Depression Screen     09/03/2023    2:53 PM 08/30/2022    2:30  PM 08/29/2022    4:27 PM 07/28/2021   10:19 AM 07/27/2020   10:41 AM 07/25/2019    9:54 AM 07/23/2018    8:07 AM  PHQ 2/9 Scores  PHQ - 2 Score 0 0 0 0 0 0 0  PHQ- 9 Score 0    0 0 0    Fall Risk     08/31/2023    4:50 PM 08/30/2022    2:31 PM 08/29/2022    4:26 PM 08/29/2022    3:08 PM 07/28/2021   10:22 AM  Fall Risk   Falls in the past year? 0 0 0 0 0  Number falls in past yr: 0 0 0  0  Injury with Fall? 0 0 0  0  Risk for fall due to : No Fall Risks No Fall Risks No Fall Risks  No Fall Risks  Follow up Falls evaluation completed Falls prevention discussed;Falls evaluation completed Falls evaluation completed  Falls evaluation completed      Data saved with a previous flowsheet row definition    MEDICARE RISK AT HOME:  Medicare Risk at Home Any stairs in or around the home?: (Patient-Rptd) No If so, are there any without handrails?: (Patient-Rptd) No Home free of loose throw rugs in walkways, pet beds, electrical cords, etc?: (Patient-Rptd)  Yes Adequate lighting in your home to reduce risk of falls?: (Patient-Rptd) Yes Life alert?: (Patient-Rptd) No Use of a cane, walker or w/c?: (Patient-Rptd) No Grab bars in the bathroom?: (Patient-Rptd) Yes Shower chair or bench in shower?: (Patient-Rptd) No Elevated toilet seat or a handicapped toilet?: (Patient-Rptd) Yes  TIMED UP AND GO:  Was the test performed?  No  Cognitive Function: 6CIT completed    07/27/2020   10:44 AM 07/25/2019    9:57 AM 07/23/2018    8:07 AM 07/10/2017    8:26 AM 05/23/2016   10:23 AM  MMSE - Mini Mental State Exam  Not completed:  Refused     Orientation to time 5  5 5 5    Orientation to Place 5  5 5 5    Registration 3  3 3 3    Attention/ Calculation 5  0 0 0   Recall 3  3 3 2    Recall-comments     pt was unable to recall 1 of 3 words   Language- name 2 objects   0 0 0   Language- repeat 1  1 1 1   Language- follow 3 step command   0 3 3   Language- read & follow direction   0 0 0   Write a sentence    0 0 0   Copy design   0 0 0   Total score   17 20 19       Data saved with a previous flowsheet row definition        09/03/2023    2:51 PM 08/30/2022    2:34 PM  6CIT Screen  What Year? 0 points 0 points  What month? 0 points 0 points  What time? 0 points 0 points  Count back from 20 0 points 0 points  Months in reverse 0 points 0 points  Repeat phrase 0 points 0 points  Total Score 0 points 0 points    Immunizations Immunization History  Administered Date(s) Administered   Fluad Quad(high Dose 65+) 11/12/2018, 12/13/2019   Influenza Split 11/21/2010, 12/13/2011   Influenza Whole 01/04/2007, 12/02/2007, 12/02/2008, 12/29/2009   Influenza, High Dose Seasonal PF 12/14/2015, 12/04/2016, 12/31/2020, 11/30/2021   Influenza,inj,Quad PF,6+ Mos 12/11/2012, 01/21/2014, 12/19/2017   Influenza-Unspecified 01/01/2015, 12/15/2022   Moderna Covid-19 Fall Seasonal Vaccine 34yrs & older 12/01/2022   Moderna Covid-19 Vaccine Bivalent Booster 3yrs & up 01/14/2021   Moderna SARS-COV2 Booster Vaccination 06/14/2020   Moderna Sars-Covid-2 Vaccination 03/20/2019, 04/17/2019, 01/16/2020   Pneumococcal Conjugate-13 02/09/2016   Pneumococcal Polysaccharide-23 06/04/2005, 12/11/2012   Respiratory Syncytial Virus Vaccine,Recomb Aduvanted(Arexvy) 12/29/2022   Td 01/05/2004   Tdap 08/04/2012   Zoster Recombinant(Shingrix) 01/07/2018, 06/15/2019   Zoster, Live 07/16/2009    Screening Tests Health Maintenance  Topic Date Due   DTaP/Tdap/Td (3 - Td or Tdap) 08/05/2022   COVID-19 Vaccine (6 - Moderna risk 2024-25 season) 05/31/2023   INFLUENZA VACCINE  10/05/2023   MAMMOGRAM  12/22/2023   Medicare Annual Wellness (AWV)  09/02/2024   DEXA SCAN  11/29/2026   Pneumococcal Vaccine: 50+ Years  Completed   Hepatitis C Screening  Completed   Zoster Vaccines- Shingrix  Completed   Hepatitis B Vaccines  Aged Out   HPV VACCINES  Aged Out   Meningococcal B Vaccine  Aged Out   Colonoscopy  Discontinued     Health Maintenance  Health Maintenance Due  Topic Date Due   DTaP/Tdap/Td (3 - Td or Tdap) 08/05/2022   COVID-19 Vaccine (6 - Moderna risk  2024-25 season) 05/31/2023   Health Maintenance Items Addressed:   Additional Screening:  Vision Screening: Recommended annual ophthalmology exams for early detection of glaucoma and other disorders of the eye. Would you like a referral to an eye doctor? No    Dental Screening: Recommended annual dental exams for proper oral hygiene  Community Resource Referral / Chronic Care Management: CRR required this visit?  No   CCM required this visit?  No   Plan:    I have personally reviewed and noted the following in the patient's chart:   Medical and social history Use of alcohol, tobacco or illicit drugs  Current medications and supplements including opioid prescriptions. Patient is not currently taking opioid prescriptions. Functional ability and status Nutritional status Physical activity Advanced directives List of other physicians Hospitalizations, surgeries, and ER visits in previous 12 months Vitals Screenings to include cognitive, depression, and falls Referrals and appointments  In addition, I have reviewed and discussed with patient certain preventive protocols, quality metrics, and best practice recommendations. A written personalized care plan for preventive services as well as general preventive health recommendations were provided to patient.   Lyle MARLA Right, [place]   09/03/2023   After Visit Summary: (MyChart) Due to this being a telephonic visit, the after visit summary with patients personalized plan was offered to patient via MyChart   Notes: Nothing significant to report at this time.

## 2023-09-05 ENCOUNTER — Encounter: Payer: Self-pay | Admitting: Family Medicine

## 2023-09-05 ENCOUNTER — Ambulatory Visit (INDEPENDENT_AMBULATORY_CARE_PROVIDER_SITE_OTHER): Admitting: Family Medicine

## 2023-09-05 VITALS — BP 124/72 | HR 68 | Temp 98.3°F | Ht 61.42 in | Wt 140.2 lb

## 2023-09-05 DIAGNOSIS — Q613 Polycystic kidney, unspecified: Secondary | ICD-10-CM | POA: Diagnosis not present

## 2023-09-05 DIAGNOSIS — M069 Rheumatoid arthritis, unspecified: Secondary | ICD-10-CM

## 2023-09-05 DIAGNOSIS — Z0000 Encounter for general adult medical examination without abnormal findings: Secondary | ICD-10-CM

## 2023-09-05 DIAGNOSIS — E782 Mixed hyperlipidemia: Secondary | ICD-10-CM

## 2023-09-05 DIAGNOSIS — J4520 Mild intermittent asthma, uncomplicated: Secondary | ICD-10-CM | POA: Diagnosis not present

## 2023-09-05 DIAGNOSIS — K22719 Barrett's esophagus with dysplasia, unspecified: Secondary | ICD-10-CM

## 2023-09-05 DIAGNOSIS — D849 Immunodeficiency, unspecified: Secondary | ICD-10-CM

## 2023-09-05 DIAGNOSIS — G4733 Obstructive sleep apnea (adult) (pediatric): Secondary | ICD-10-CM

## 2023-09-05 DIAGNOSIS — F5104 Psychophysiologic insomnia: Secondary | ICD-10-CM

## 2023-09-05 NOTE — Assessment & Plan Note (Signed)
 Resolved on PPI per endoscopy

## 2023-09-05 NOTE — Assessment & Plan Note (Signed)
Increased risk of infection on methotrexate for rheumatoid arthritis.

## 2023-09-05 NOTE — Assessment & Plan Note (Signed)
Improved symptoms with oral appliance

## 2023-09-05 NOTE — Assessment & Plan Note (Signed)
Chronic, stable control  Rare issue on allergy treatment

## 2023-09-05 NOTE — Assessment & Plan Note (Signed)
 Followed by rheumatologist Dr. Tobie.  Moderate control with PT, MTX and kevzara

## 2023-09-05 NOTE — Assessment & Plan Note (Addendum)
 Chronic, stable control followed by nephrology Dr. Dennise

## 2023-09-05 NOTE — Progress Notes (Signed)
 Patient ID: Sydney Ortiz, female    DOB: August 30, 1945, 78 y.o.   MRN: [id number]  This visit was conducted in person.  BP 124/72   Pulse 68   Temp 98.3 F (36.8 C) (Oral)   Ht 5' 1.42 (1.56 m)   Wt 140 lb 3.2 oz (63.6 kg)   SpO2 96%   BMI 26.13 kg/m    CC:  Chief Complaint  Patient presents with   Annual Exam    Subjective:   HPI: Sydney Ortiz is a 78 y.o. female presenting on 09/05/2023 for Annual Exam  The patient presents for  complete physical and review of chronic health problems. He/She also has the following acute concerns today:  trouble sleeping at night... she is using tylenol  PM  The patient saw a LPN or RN for medicare wellness visit. 09/03/2023  Prevention and wellness was reviewed in detail. Note reviewed and important notes copied below.   Rheumatoid arthritis:  followed now by Dr. Tobie.  On  methotrexate and Kevzara injections  Has been doing better overall.  Body mass index is 26.13 kg/m.  Elevated Cholesterol:  LDL at goal < 100 on atorvastatin  10 mg daily Lab Results  Component Value Date   CHOL 184 08/29/2023   HDL 54.60 08/29/2023   LDLCALC 91 08/29/2023   LDLDIRECT 130.8 11/15/2009   TRIG 188.0 (H) 08/29/2023   CHOLHDL 3 08/29/2023  Using medications without problems: Muscle aches:  Diet compliance: good Exercise: regular Other complaints:   Mild intermittent asthma: Allegra, flonase  control allergies.   Polycystic kidney disease followed by Renal. Dr. Dennise  Hx of microscopic colitis:  no flares recent  EGD normal 2022, hx of barretts  Patient Care Team: Avelina Greig BRAVO, MD as PCP - General Portia Fireman, OD as Consulting Physician (Optometry) Debarah Charlie Franky Mickey., MD as Referring Physician (Gastroenterology) Rayburn Pac, MD as Consulting Physician (Nephrology)  Relevant past medical, surgical, family and social history reviewed and updated as indicated. Interim medical history since our last visit  reviewed. Allergies and medications reviewed and updated. Outpatient Medications Prior to Visit  Medication Sig Dispense Refill   acetaminophen  (TYLENOL ) 650 MG CR tablet Take 1,300 mg by mouth every 8 (eight) hours.     atorvastatin  (LIPITOR) 10 MG tablet TAKE 1 TABLET BY MOUTH DAILY 90 tablet 0   diphenhydramine-acetaminophen  (TYLENOL  PM) 25-500 MG TABS Take 1 tablet by mouth at bedtime as needed (sleep).      fexofenadine (ALLEGRA) 60 MG tablet Take 180 mg by mouth daily.     fluticasone  (FLONASE ) 50 MCG/ACT nasal spray Place 2 sprays into both nostrils daily as needed for allergies.      folic acid (FOLVITE) 1 MG tablet Take 1 mg by mouth daily.     methotrexate (RHEUMATREX) 2.5 MG tablet Take 20 mg by mouth once a week. (Patient taking differently: Take 2.5 mg by mouth once a week. 5 tablets every Friday)     Multiple Vitamins-Minerals (MULTIVITAMIN ADULT PO) Take 1 tablet by mouth daily.      omeprazole (PRILOSEC) 20 MG capsule Take 20 mg by mouth 2 (two) times daily.     Probiotic Product (PROBIOTIC-10 PO) Take 1 capsule by mouth daily.      sarilumab (KEVZARA) 200 MG/1. SOSY Inject 200 mg into the skin every 14 (fourteen) days.     zoledronic acid (RECLAST) 5 MG/100ML SOLN injection Inject 5 mg into the vein once. (Patient taking differently: Inject 5 mg into the vein once.  Once a year)     No facility-administered medications prior to visit.     Per HPI unless specifically indicated in ROS section below  BP 124/72   Pulse 68   Temp 98.3 F (36.8 C) (Oral)   Ht 5' 1.42 (1.56 m)   Wt 140 lb 3.2 oz (63.6 kg)   SpO2 96%   BMI 26.13 kg/m   Wt Readings from Last 3 Encounters:  09/05/23 140 lb 3.2 oz (63.6 kg)  09/03/23 139 lb (63 kg)  08/30/22 144 lb (65.3 kg)      Physical Exam Vitals and nursing note reviewed.  Constitutional:      General: She is not in acute distress.    Appearance: Normal appearance. She is well-developed. She is not ill-appearing or  toxic-appearing.  HENT:     Head: Normocephalic.     Right Ear: Hearing, tympanic membrane, ear canal and external ear normal.     Left Ear: Hearing, tympanic membrane, ear canal and external ear normal.     Nose: Nose normal.  Eyes:     General: Lids are normal. Lids are everted, no foreign bodies appreciated.     Conjunctiva/sclera: Conjunctivae normal.     Pupils: Pupils are equal, round, and reactive to light.  Neck:     Thyroid : No thyroid  mass or thyromegaly.     Vascular: No carotid bruit.     Trachea: Trachea normal.  Cardiovascular:     Rate and Rhythm: Normal rate and regular rhythm.     Heart sounds: Normal heart sounds, S1 normal and S2 normal. No murmur heard.    No gallop.  Pulmonary:     Effort: Pulmonary effort is normal. No respiratory distress.     Breath sounds: Normal breath sounds. No wheezing, rhonchi or rales.  Abdominal:     General: Bowel sounds are normal. There is no distension or abdominal bruit.     Palpations: Abdomen is soft. There is no fluid wave or mass.     Tenderness: There is no abdominal tenderness. There is no guarding or rebound.     Hernia: No hernia is present.  Musculoskeletal:     Cervical back: Normal range of motion and neck supple.  Lymphadenopathy:     Cervical: No cervical adenopathy.  Skin:    General: Skin is warm and dry.     Findings: No rash.  Neurological:     Mental Status: She is alert.     Cranial Nerves: No cranial nerve deficit.     Sensory: No sensory deficit.  Psychiatric:        Mood and Affect: Mood is not anxious or depressed.        Speech: Speech normal.        Behavior: Behavior normal. Behavior is cooperative.        Judgment: Judgment normal.       Results for orders placed or performed in visit on 08/29/23  VITAMIN D  25 Hydroxy (Vit-D Deficiency, Fractures)   Collection Time: 08/29/23  7:55 AM  Result Value Ref Range   VITD 54.68 30.00 - 100.00 ng/mL  Lipid panel   Collection Time: 08/29/23  7:55  AM  Result Value Ref Range   Cholesterol 184 0 - 200 mg/dL   Triglycerides 811.9 (H) 0.0 - 149.0 mg/dL   HDL 45.39 >60.99 mg/dL   VLDL 62.3 0.0 - 59.9 mg/dL   LDL Cholesterol 91 0 - 99 mg/dL   Total CHOL/HDL Ratio 3  NonHDL 129.05   Comprehensive metabolic panel   Collection Time: 08/29/23  7:55 AM  Result Value Ref Range   Sodium 142 135 - 145 mEq/L   Potassium 4.2 3.5 - 5.1 mEq/L   Chloride 105 96 - 112 mEq/L   CO2 30 19 - 32 mEq/L   Glucose, Bld 99 70 - 99 mg/dL   BUN 14 6 - 23 mg/dL   Creatinine, Ser 9.23 0.40 - 1.20 mg/dL   Total Bilirubin 0.7 0.2 - 1.2 mg/dL   Alkaline Phosphatase 35 (L) 39 - 117 U/L   AST 21 0 - 37 U/L   ALT 20 0 - 35 U/L   Total Protein 6.6 6.0 - 8.3 g/dL   Albumin 4.5 3.5 - 5.2 g/dL   GFR 24.86 >39.99 mL/min   Calcium  9.5 8.4 - 10.5 mg/dL     COVID 19 screen:  No recent travel or known exposure to COVID19 The patient denies respiratory symptoms of COVID 19 at this time. The importance of social distancing was discussed today.   Assessment and Plan   The patient's preventative maintenance and recommended screening tests for an annual wellness exam were reviewed in full today. Brought up to date unless services declined.  Counselled on the importance of diet, exercise, and its role in overall health and mortality. The patient's FH and SH was reviewed, including their home life, tobacco status, and drug and alcohol status.   Mammo/breast exam: personal history of breast cancer: 12/2022 Vaccines: Uptodate with PNA, flu, shingles,prevnar, COVID19 x 4, consider tdap if injured. DVE/pap:partial hysterectomy, one ovary remains. DVE not indicated, no pap indicated.   Colon: 09/2014  bx microscopic colitis, repeat in 10 years Dr. Debarah at [facility]. DXA:  11/28/2021 Dx with osteoporosis .SABRA  Reclast x since 2023 .SABRA Per Dr. Tobie. Former smoker minimal use. Hep C negative Problem List Items Addressed This Visit     Barrett's esophagus   Resolved on PPI per  endoscopy      Chronic insomnia    Currently using tylenol  PM nightly.. she will try minimizing use. Will try trial of melatonin, continue sleep hygiene.   If not improving, will consider imipramine for sleep given body pain with RA. She will let me know if she wishes to proceed.         Hyperlipidemia   LDL remains at goal < 100 and improved from last year.   On atorvastatin  10mg  daily.      Immunocompromised patient (HCC)   Increased risk of infection on methotrexate for rheumatoid arthritis.      Mild intermittent asthma   Chronic, stable control  Rare issue on allergy treatment      OSA (obstructive sleep apnea)   Improved symptoms with oral appliance      Polycystic kidney disease   Chronic, stable control followed by nephrology Dr. Dennise      Rheumatoid arthritis [facility])   Followed by rheumatologist Dr. Tobie.  Moderate control with PT, MTX and kevzara       Other Visit Diagnoses       Routine general medical examination at a health care facility    -  Primary         Greig Ring, MD

## 2023-09-05 NOTE — Assessment & Plan Note (Signed)
 Currently using tylenol  PM nightly.. she will try minimizing use. Will try trial of melatonin, continue sleep hygiene.   If not improving, will consider imipramine for sleep given body pain with RA. She will let me know if she wishes to proceed.

## 2023-09-05 NOTE — Assessment & Plan Note (Signed)
LDL remains at goal < 100 and improved from last year. On atorvastatin 10mg  daily.

## 2023-11-16 ENCOUNTER — Other Ambulatory Visit: Payer: Self-pay | Admitting: Family Medicine

## 2023-11-16 DIAGNOSIS — E782 Mixed hyperlipidemia: Secondary | ICD-10-CM

## 2023-11-30 ENCOUNTER — Other Ambulatory Visit: Payer: Self-pay | Admitting: Family Medicine

## 2023-11-30 DIAGNOSIS — Z1231 Encounter for screening mammogram for malignant neoplasm of breast: Secondary | ICD-10-CM

## 2023-12-25 ENCOUNTER — Ambulatory Visit
Admission: RE | Admit: 2023-12-25 | Discharge: 2023-12-25 | Disposition: A | Discharge status: Home / Self Care | Source: Ambulatory Visit | Attending: Family Medicine | Admitting: Family Medicine

## 2023-12-25 DIAGNOSIS — Z1231 Encounter for screening mammogram for malignant neoplasm of breast: Secondary | ICD-10-CM

## 2023-12-26 ENCOUNTER — Encounter: Payer: Self-pay | Admitting: Family Medicine

## 2023-12-27 ENCOUNTER — Ambulatory Visit: Payer: Self-pay | Admitting: Family Medicine

## 2024-09-03 ENCOUNTER — Ambulatory Visit

## 2024-09-04 ENCOUNTER — Other Ambulatory Visit

## 2024-09-12 ENCOUNTER — Encounter: Admitting: Family Medicine
